# Patient Record
Sex: Female | Born: 1970 | Race: Black or African American | Hispanic: No | Marital: Married | Smoking: Former smoker
Health system: Southern US, Community
[De-identification: ages and names within clinical notes are randomized; demographics above are authoritative.]

## PROBLEM LIST (undated history)

## (undated) DIAGNOSIS — Z8042 Family history of malignant neoplasm of prostate: Secondary | ICD-10-CM

## (undated) DIAGNOSIS — T7840XA Allergy, unspecified, initial encounter: Secondary | ICD-10-CM

## (undated) DIAGNOSIS — M1990 Unspecified osteoarthritis, unspecified site: Secondary | ICD-10-CM

## (undated) DIAGNOSIS — Z801 Family history of malignant neoplasm of trachea, bronchus and lung: Secondary | ICD-10-CM

## (undated) DIAGNOSIS — D649 Anemia, unspecified: Secondary | ICD-10-CM

## (undated) DIAGNOSIS — Z8711 Personal history of peptic ulcer disease: Secondary | ICD-10-CM

## (undated) DIAGNOSIS — K219 Gastro-esophageal reflux disease without esophagitis: Secondary | ICD-10-CM

## (undated) DIAGNOSIS — Z800 Family history of malignant neoplasm of digestive organs: Secondary | ICD-10-CM

## (undated) DIAGNOSIS — G4700 Insomnia, unspecified: Secondary | ICD-10-CM

## (undated) DIAGNOSIS — I10 Essential (primary) hypertension: Secondary | ICD-10-CM

## (undated) DIAGNOSIS — Z803 Family history of malignant neoplasm of breast: Secondary | ICD-10-CM

## (undated) DIAGNOSIS — Z8049 Family history of malignant neoplasm of other genital organs: Secondary | ICD-10-CM

## (undated) HISTORY — PX: TUBAL LIGATION: SHX77

## (undated) HISTORY — DX: Personal history of peptic ulcer disease: Z87.11

## (undated) HISTORY — DX: Family history of malignant neoplasm of other genital organs: Z80.49

## (undated) HISTORY — DX: Unspecified osteoarthritis, unspecified site: M19.90

## (undated) HISTORY — DX: Family history of malignant neoplasm of breast: Z80.3

## (undated) HISTORY — DX: Family history of malignant neoplasm of trachea, bronchus and lung: Z80.1

## (undated) HISTORY — PX: CHOLECYSTECTOMY: SHX55

## (undated) HISTORY — DX: Anemia, unspecified: D64.9

## (undated) HISTORY — PX: APPENDECTOMY: SHX54

## (undated) HISTORY — PX: ABLATION: SHX5711

## (undated) HISTORY — DX: Family history of malignant neoplasm of prostate: Z80.42

## (undated) HISTORY — DX: Allergy, unspecified, initial encounter: T78.40XA

## (undated) HISTORY — DX: Family history of malignant neoplasm of digestive organs: Z80.0

## (undated) HISTORY — DX: Gastro-esophageal reflux disease without esophagitis: K21.9

## (undated) HISTORY — DX: Essential (primary) hypertension: I10

## (undated) HISTORY — PX: HERNIA REPAIR: SHX51

---

## 2000-12-21 ENCOUNTER — Encounter (HOSPITAL_COMMUNITY): Admission: RE | Admit: 2000-12-21 | Discharge: 2001-01-20 | Payer: Self-pay | Admitting: Rheumatology

## 2001-02-08 ENCOUNTER — Encounter (HOSPITAL_COMMUNITY): Admission: RE | Admit: 2001-02-08 | Discharge: 2001-03-10 | Payer: Self-pay | Admitting: Rheumatology

## 2001-03-28 ENCOUNTER — Ambulatory Visit (HOSPITAL_COMMUNITY): Admission: RE | Admit: 2001-03-28 | Discharge: 2001-03-28 | Payer: Self-pay | Admitting: Family Medicine

## 2001-03-28 ENCOUNTER — Encounter: Payer: Self-pay | Admitting: Family Medicine

## 2003-02-25 ENCOUNTER — Encounter: Payer: Self-pay | Admitting: Family Medicine

## 2003-02-25 ENCOUNTER — Ambulatory Visit (HOSPITAL_COMMUNITY): Admission: RE | Admit: 2003-02-25 | Discharge: 2003-02-25 | Payer: Self-pay | Admitting: Family Medicine

## 2003-08-19 ENCOUNTER — Ambulatory Visit (HOSPITAL_COMMUNITY): Admission: RE | Admit: 2003-08-19 | Discharge: 2003-08-19 | Payer: Self-pay | Admitting: Family Medicine

## 2004-01-01 ENCOUNTER — Encounter (HOSPITAL_COMMUNITY): Admission: RE | Admit: 2004-01-01 | Discharge: 2004-01-31 | Payer: Self-pay | Admitting: Family Medicine

## 2004-01-06 ENCOUNTER — Ambulatory Visit (HOSPITAL_COMMUNITY): Admission: RE | Admit: 2004-01-06 | Discharge: 2004-01-06 | Payer: Self-pay | Admitting: Family Medicine

## 2004-01-22 ENCOUNTER — Ambulatory Visit (HOSPITAL_COMMUNITY): Admission: RE | Admit: 2004-01-22 | Discharge: 2004-01-22 | Payer: Self-pay | Admitting: General Surgery

## 2004-08-31 ENCOUNTER — Ambulatory Visit (HOSPITAL_COMMUNITY): Admission: RE | Admit: 2004-08-31 | Discharge: 2004-08-31 | Payer: Self-pay | Admitting: Family Medicine

## 2004-09-23 ENCOUNTER — Ambulatory Visit (HOSPITAL_COMMUNITY): Admission: RE | Admit: 2004-09-23 | Discharge: 2004-09-23 | Payer: Self-pay | Admitting: *Deleted

## 2004-10-26 ENCOUNTER — Encounter (HOSPITAL_COMMUNITY): Admission: RE | Admit: 2004-10-26 | Discharge: 2004-10-26 | Payer: Self-pay | Admitting: *Deleted

## 2004-11-08 ENCOUNTER — Emergency Department (HOSPITAL_COMMUNITY): Admission: EM | Admit: 2004-11-08 | Discharge: 2004-11-08 | Payer: Self-pay | Admitting: *Deleted

## 2004-11-23 ENCOUNTER — Ambulatory Visit (HOSPITAL_COMMUNITY): Admission: RE | Admit: 2004-11-23 | Discharge: 2004-11-23 | Payer: Self-pay | Admitting: *Deleted

## 2005-01-13 ENCOUNTER — Ambulatory Visit (HOSPITAL_COMMUNITY): Admission: RE | Admit: 2005-01-13 | Discharge: 2005-01-13 | Payer: Self-pay | Admitting: Family Medicine

## 2005-04-08 ENCOUNTER — Ambulatory Visit: Payer: Self-pay | Admitting: Family Medicine

## 2006-10-24 ENCOUNTER — Ambulatory Visit: Payer: Self-pay | Admitting: Family Medicine

## 2006-10-24 DIAGNOSIS — N920 Excessive and frequent menstruation with regular cycle: Secondary | ICD-10-CM

## 2006-10-24 DIAGNOSIS — N951 Menopausal and female climacteric states: Secondary | ICD-10-CM

## 2006-10-24 DIAGNOSIS — I10 Essential (primary) hypertension: Secondary | ICD-10-CM | POA: Insufficient documentation

## 2006-11-23 ENCOUNTER — Encounter: Payer: Self-pay | Admitting: Family Medicine

## 2006-11-28 ENCOUNTER — Encounter: Payer: Self-pay | Admitting: Family Medicine

## 2006-12-04 ENCOUNTER — Encounter: Payer: Self-pay | Admitting: Family Medicine

## 2006-12-11 ENCOUNTER — Encounter: Payer: Self-pay | Admitting: Family Medicine

## 2006-12-11 ENCOUNTER — Ambulatory Visit (HOSPITAL_COMMUNITY): Admission: RE | Admit: 2006-12-11 | Discharge: 2006-12-11 | Payer: Self-pay | Admitting: Family Medicine

## 2006-12-11 LAB — CONVERTED CEMR LAB
ALT: 40 units/L — ABNORMAL HIGH (ref 0–35)
AST: 21 units/L (ref 0–37)
Albumin: 4 g/dL (ref 3.5–5.2)
Alkaline Phosphatase: 65 units/L (ref 39–117)
Calcium: 8.9 mg/dL (ref 8.4–10.5)
Cholesterol: 163 mg/dL (ref 0–200)
HCT: 39 % (ref 36.0–46.0)
Hemoglobin: 11.7 g/dL — ABNORMAL LOW (ref 12.0–15.0)
LDL Cholesterol: 112 mg/dL — ABNORMAL HIGH (ref 0–99)
MCV: 80.2 fL (ref 78.0–100.0)
Platelets: 459 10*3/uL — ABNORMAL HIGH (ref 150–400)
RDW: 17.2 % — ABNORMAL HIGH (ref 11.5–14.0)
Sodium: 139 meq/L (ref 135–145)
TSH: 0.607 microintl units/mL (ref 0.350–5.50)
Total Bilirubin: 0.5 mg/dL (ref 0.3–1.2)

## 2007-06-27 ENCOUNTER — Ambulatory Visit: Payer: Self-pay | Admitting: Obstetrics & Gynecology

## 2007-06-27 ENCOUNTER — Encounter: Payer: Self-pay | Admitting: Obstetrics & Gynecology

## 2007-07-11 ENCOUNTER — Ambulatory Visit: Payer: Self-pay | Admitting: Obstetrics & Gynecology

## 2007-07-17 ENCOUNTER — Encounter: Admission: RE | Admit: 2007-07-17 | Discharge: 2007-07-17 | Payer: Self-pay | Admitting: Obstetrics & Gynecology

## 2007-07-24 ENCOUNTER — Encounter: Payer: Self-pay | Admitting: Family Medicine

## 2007-07-25 ENCOUNTER — Ambulatory Visit: Payer: Self-pay | Admitting: Obstetrics & Gynecology

## 2007-07-26 ENCOUNTER — Encounter: Payer: Self-pay | Admitting: Family Medicine

## 2007-08-22 ENCOUNTER — Ambulatory Visit (HOSPITAL_COMMUNITY): Admission: RE | Admit: 2007-08-22 | Discharge: 2007-08-28 | Payer: Self-pay | Admitting: Obstetrics & Gynecology

## 2007-09-17 ENCOUNTER — Encounter: Payer: Self-pay | Admitting: Obstetrics & Gynecology

## 2007-09-17 ENCOUNTER — Ambulatory Visit (HOSPITAL_COMMUNITY): Admission: RE | Admit: 2007-09-17 | Discharge: 2007-09-17 | Payer: Self-pay | Admitting: Obstetrics & Gynecology

## 2007-09-17 ENCOUNTER — Ambulatory Visit: Payer: Self-pay | Admitting: Obstetrics & Gynecology

## 2007-09-25 ENCOUNTER — Telehealth: Payer: Self-pay | Admitting: Family Medicine

## 2007-09-28 ENCOUNTER — Ambulatory Visit: Payer: Self-pay | Admitting: Family Medicine

## 2007-09-28 DIAGNOSIS — L700 Acne vulgaris: Secondary | ICD-10-CM | POA: Insufficient documentation

## 2007-10-09 ENCOUNTER — Ambulatory Visit: Payer: Self-pay | Admitting: Family Medicine

## 2007-10-11 ENCOUNTER — Encounter: Payer: Self-pay | Admitting: Family Medicine

## 2007-12-25 ENCOUNTER — Ambulatory Visit: Payer: Self-pay | Admitting: Family Medicine

## 2007-12-25 DIAGNOSIS — N644 Mastodynia: Secondary | ICD-10-CM

## 2007-12-27 ENCOUNTER — Encounter: Payer: Self-pay | Admitting: Family Medicine

## 2007-12-27 LAB — CONVERTED CEMR LAB
BUN: 16 mg/dL (ref 6–23)
CO2: 25 meq/L (ref 19–32)
Calcium: 9.6 mg/dL (ref 8.4–10.5)
Chloride: 103 meq/L (ref 96–112)
Creatinine, Ser: 0.74 mg/dL (ref 0.40–1.20)
Glucose, Bld: 86 mg/dL (ref 70–99)
HDL: 39 mg/dL — ABNORMAL LOW (ref >39)
LDL Cholesterol: 97 mg/dL (ref 0–99)
Total Bilirubin: 0.5 mg/dL (ref 0.3–1.2)
Total CHOL/HDL Ratio: 3.7
VLDL: 10 mg/dL (ref 0–40)

## 2007-12-28 ENCOUNTER — Encounter: Payer: Self-pay | Admitting: Family Medicine

## 2008-01-02 ENCOUNTER — Encounter: Admission: RE | Admit: 2008-01-02 | Discharge: 2008-01-02 | Payer: Self-pay | Admitting: Family Medicine

## 2008-02-20 ENCOUNTER — Telehealth: Payer: Self-pay | Admitting: Family Medicine

## 2008-04-01 ENCOUNTER — Ambulatory Visit: Payer: Self-pay | Admitting: Family Medicine

## 2008-05-01 ENCOUNTER — Ambulatory Visit: Payer: Self-pay | Admitting: Family Medicine

## 2008-05-01 DIAGNOSIS — M25562 Pain in left knee: Secondary | ICD-10-CM | POA: Insufficient documentation

## 2008-05-01 DIAGNOSIS — M2140 Flat foot [pes planus] (acquired), unspecified foot: Secondary | ICD-10-CM | POA: Insufficient documentation

## 2008-05-01 DIAGNOSIS — M25569 Pain in unspecified knee: Secondary | ICD-10-CM

## 2008-05-15 ENCOUNTER — Encounter: Admission: RE | Admit: 2008-05-15 | Discharge: 2008-05-15 | Payer: Self-pay | Admitting: Sports Medicine

## 2009-06-12 ENCOUNTER — Ambulatory Visit (HOSPITAL_COMMUNITY): Admission: RE | Admit: 2009-06-12 | Discharge: 2009-06-12 | Payer: Self-pay | Admitting: Family Medicine

## 2010-08-09 ENCOUNTER — Ambulatory Visit (HOSPITAL_COMMUNITY)
Admission: RE | Admit: 2010-08-09 | Discharge: 2010-08-09 | Payer: Self-pay | Source: Home / Self Care | Attending: Internal Medicine | Admitting: Internal Medicine

## 2010-08-14 ENCOUNTER — Encounter: Payer: Self-pay | Admitting: Family Medicine

## 2010-08-15 ENCOUNTER — Encounter: Payer: Self-pay | Admitting: Family Medicine

## 2010-08-15 ENCOUNTER — Encounter: Payer: Self-pay | Admitting: *Deleted

## 2010-08-16 ENCOUNTER — Encounter
Admission: RE | Admit: 2010-08-16 | Discharge: 2010-08-16 | Payer: Self-pay | Source: Home / Self Care | Attending: Internal Medicine | Admitting: Internal Medicine

## 2010-08-18 ENCOUNTER — Ambulatory Visit (HOSPITAL_COMMUNITY): Admission: RE | Admit: 2010-08-18 | Payer: Self-pay | Source: Home / Self Care | Admitting: Internal Medicine

## 2010-08-24 NOTE — Letter (Signed)
Summary: rpc chart  rpc chart   Imported By: Curtis Sites 02/22/2010 14:42:22  _____________________________________________________________________  External Attachment:    Type:   Image     Comment:   External Document

## 2010-08-25 ENCOUNTER — Encounter: Payer: Self-pay | Admitting: Internal Medicine

## 2010-12-07 NOTE — Assessment & Plan Note (Signed)
NAME:  Kristine Lawson, Kristine Lawson              ACCOUNT NO.:  [id number]   MEDICAL RECORD NO.:  [id number]          PATIENT TYPE:  POB   LOCATION:  [facility] [place]         FACILITY:  [facility]   PHYSICIAN:  Elsie Lincoln, MD      DATE OF BIRTH:  04-Aug-1970   DATE OF SERVICE:                                  CLINIC NOTE   The patient presents for followup results of her transvaginal ultrasound  and endometrial biopsy.  The endometrial biopsy shows portions of a  benign endometrial polyp with no hyperplasia or malignancy identified.  The uterus is grossly normal with a 12-mm endometrium.  There is a  dominant follicle in one of her ovaries.  Otherwise normal.   I talked to the patient about getting the polyp out.  We decided for  D&C, hysteroscopy, polypectomy, and I offered to go ahead and do hydro-  thermo-ablation to help her generally heavy periods.  The patient was  consented for the procedure.  The risks include but are not limited to  bleeding, infection, damage to the back of the uterus and burning of the  vagina.  The patient will hopefully be scheduled some time in January.           ______________________________  Elsie Lincoln, MD     KL/MEDQ  D:  07/25/2007  T:  07/25/2007  Job:  [id number]
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
## 2010-12-07 NOTE — Op Note (Signed)
[name], Kristine Lawson              ACCOUNT NO.:  [id number]   MEDICAL RECORD NO.:  [id number]          PATIENT TYPE:  AMB   LOCATION:  [facility]                           FACILITY:  [facility]   PHYSICIAN:  Lesly Dukes, M.D. DATE OF BIRTH:  Nov 29, 1970   DATE OF PROCEDURE:  09/17/2007  DATE OF DISCHARGE:                               OPERATIVE REPORT   PREOPERATIVE DIAGNOSIS:  A 40 year old female with menometrorrhagia and  endometrial polyp.   POSTOPERATIVE DIAGNOSIS:  A 40 year old female with menometrorrhagia and  endometrial polyp.   PROCEDURES:  1. Dilation and curettage.  2. Hysteroscopy.  3. Polypectomy.  4. Endometrial ablation.   SURGEON:  Lesly Dukes, M.D.   ANESTHESIA:  General.   FINDINGS:  Small polyp at the anterior endocervical uterine junction.   SPECIMEN:  Endometrial curettings to pathology.   ESTIMATED BLOOD LOSS:  Minimal.   COMPLICATIONS:  None.   PROCEDURE:  After informed consent was obtained, the patient was taken  to the operating room.  General anesthesia was found to be adequate.  The patient was placed in the dorsal lithotomy position and prepared and  draped in the normal sterile fashion.  The bladder was emptied with the  catheter.  A bivalved speculum was placed into the patient's vagina and  the anterior lip of the cervix was grasped with the single-toothed  tenaculum.  The cervical os was gently dilated to 7 Hegar dilator.  We  used the hydrothermal ablation by [employer] for this procedure.  The hysteroscope was introduced into the uterus and a polyp was seen.  A  gentle D& C was done and specimen was sent to pathology.  The  hysteroscope was then reintroduced, and there was a good seal of the  cervix.  The ablation was performed to accompany guidelines lasting 10  minutes with an appropriate cool down.  All  instruments were removed from the patient's vagina, and there was good  hemostasis from the cervix.  The patient tolerated  the procedure well.  Sponge, lap, instrument and needle counts were correct x2.  The patient  went to the recovery room in stable condition.      Lesly Dukes, M.D.  Electronically Signed     KHL/MEDQ  D:  09/17/2007  T:  09/17/2007  Job:  [id number]

## 2010-12-07 NOTE — Assessment & Plan Note (Signed)
[name], [name]              ACCOUNT NO.:  [id number]   MEDICAL RECORD NO.:  [id number]          PATIENT TYPE:  POB   LOCATION:  [facility] [place]         FACILITY:  [facility]   PHYSICIAN:  Elsie Lincoln, MD      DATE OF BIRTH:  [birth year]   DATE OF SERVICE:  07/11/2007                                  CLINIC NOTE   The patient is a 40 year old female with abnormal uterine bleeding who  was seen here first on June 27, 2007.  She presents back for  endometrial biopsy.  After informed consent was obtained, the patient  was placed in dorsal lithotomy position.  Bivalve speculum was placed  into the patient's vagina, and the cervix was prepped with Betadine.  The uterus sounded to 9 cm. Two passes with the endometrial biopsy  curette was done.  The patient is scheduled for a transvaginal  ultrasound to evaluate for fibroids or polyp.  Also of note her mother  has had premenopausal breast cancer, and she is being tested for the  BRCA 1and 2 genes.  We will follow up on that when it is done.  The  patient to come back in 2 weeks for results of endometrial biopsy and  transvaginal ultrasound, and we will come up with a plan.           ______________________________  Elsie Lincoln, MD     KL/MEDQ  D:  07/11/2007  T:  07/11/2007  Job:  [id number]

## 2010-12-10 NOTE — H&P (Signed)
NAME:  Kristine Lawson, Kristine Lawson                            ACCOUNT NO.:  [id number]   MEDICAL RECORD NO.:  [id number]                   PATIENT TYPE:  AMB   LOCATION:  DAY                                  FACILITY:  [facility]   PHYSICIAN:  Jerolyn Shin C. Katrinka Blazing, M.D.                DATE OF BIRTH:  April 15, 1971   DATE OF ADMISSION:  DATE OF DISCHARGE:                                HISTORY & PHYSICAL   A 40 year old female with greater than a 15 year history of gastroesophageal  reflux disease.  She has been treated with Nexium with minimal improvement.  She has nausea on a daily basis.  She has difficulty swallowing solids and  liquids.  She is referred for EGD.   PAST HISTORY:  1. Osteoarthritis.  2. Hypertension.  3. Seasonal allergies.   SURGERY:  1. Cholecystectomy.  2. Tubal ligation.  3. Appendectomy.   MEDICATIONS:  1. Celebrex 200 mg every day.  2. Atacand HCT 16/12.5 every day.  3. Allegra D b.i.d.  4. Nexium 40 mg every day.   FAMILY HISTORY:  Diabetes mellitus, hypertension.   PHYSICAL EXAMINATION:  VITAL SIGNS:  Blood pressure 124/84, pulse of 80,  respirations 20, weight 256 pounds.  HEENT:  Unremarkable.  NECK:  Supple.  No JVD or bruit.  CHEST:  Clear to auscultation.  HEART:  Regular rate and rhythm without murmur, gallop, or rub.  ABDOMEN:  Mild epigastric tenderness.  No masses.  EXTREMITIES:  No cyanosis, clubbing, or edema.  NEUROLOGIC:  No focal motor, sensory, or cerebellar deficits.   IMPRESSION:  1. Gastroesophageal reflux disease with dysphagia.  2. Hypertension.  3. Osteoarthritis.  4. Costochondritis.   PLAN:  Upper endoscopy.     ___________________________________________                                         Dirk Dress. Katrinka Blazing, M.D.   LCS/MEDQ  D:  01/21/2004  T:  01/22/2004  Job:  [id number]

## 2010-12-10 NOTE — Procedures (Signed)
NAMEKELSE, PLOCH                  ACCOUNT NO.:  [id number]   MEDICAL RECORD NO.:  [id number]          PATIENT TYPE:  OUT   LOCATION:  [facility]                           FACILITY:  [facility]   PHYSICIAN:  Dani Gobble, MD       DATE OF BIRTH:  10/16/1970   DATE OF PROCEDURE:  10/26/2004  DATE OF DISCHARGE:                                  ECHOCARDIOGRAM   REFERRING:  Dr. Lodema Hong and Dr. Domingo Sep.   INDICATIONS:  Chest pain and syncope.   Technical quality of the study is adequate.   The aorta is within normal limits at 2.8 cm.   The left atrium subjectively appears to be normal in size. No clots or  masses were appreciated. The patient appeared to be in sinus rhythm during  this procedure.   The interventricular septum and posterior wall were mildly thickened.   The aortic valve appeared to be thin, trileaflet, and pliable with normal  leaflet excursion. No significant aortic insufficiency is noted. Doppler  interrogation of the aortic valve is within normal limits. The mitral valve  appeared structurally normal. No mitral valve prolapse is noted. Trace to  mild mitral regurgitation was noted. Doppler interrogation of the mitral  valve was within normal limits.   Pulmonic valve was incompletely visualized but appeared to be grossly  structurally normal.   Tricuspid valve also appeared grossly structurally normal with trivial  tricuspid regurgitation noted.   Left ventricle is normal in size with the LVIDD measured at 4.7 cm and the  LVISD measured at 2.9 cm. Overall left systolic function was normal, and no  regional wall motion abnormalities were appreciated. There was no evidence  for diastolic dysfunction on the inflow signal.   The right ventricle subjectively appeared to be borderline to mildly dilated  but with normal right systolic function. The right atrium appeared normal in  size.   IMPRESSION:  1.  Mild concentric LVH.  2.  Trace to mild mitral regurgitation.  3.   Trivial tricuspid regurgitation.  4.  Normal left size and systolic function without regional wall motion      abnormalities noted.  5.  Borderline to mild right ventricle enlargement with normal right      ventricular systolic function.      AB/MEDQ  D:  10/26/2004  T:  10/26/2004  Job:  [id number]

## 2010-12-10 NOTE — Procedures (Signed)
Kristine Lawson, Kristine Lawson                  ACCOUNT NO.:  [id number]   MEDICAL RECORD NO.:  [id number]          PATIENT TYPE:  OUT   LOCATION:  [facility]                           FACILITY:  [facility]   PHYSICIAN:  Dani Gobble, MD       DATE OF BIRTH:  1971-05-23   DATE OF PROCEDURE:  10/26/2004  DATE OF DISCHARGE:                                    STRESS TEST   REFERRING PHYSICIAN:  Milus Mallick. Lodema Hong, M.D., Dani Gobble, MD   INDICATIONS FOR PROCEDURE:  Chest pain and syncope.   ELECTROCARDIOGRAM/ HEMODYNAMIC DATA:  Baseline blood pressure 122/80 mmHg  with pulse of 66 beats per minute.  Baseline 12 lead electrocardiogram  revealed normal sinus rhythm without ischemic changes noted.  The patient  walked for five minutes, 40 seconds on a full Bruce protocol surpassing her  target heart rate of 159 beats per minute, proceeding to a peak heart rate  of 160 beats per minute.  She experienced no chest pain during the  procedure.  She was mildly short of breath and fatigued at peak exercise.  She stopped secondary to achieving target heart rate.  Electrocardiogram at  peak heart rate was notable for sinus tachycardia without ischemic changes  noted.  She had rare PVC's.  Recovery revealed no further electrocardiogram  changes.  She recovered nicely within 5 minutes.  She had no further  symptoms.   IMPRESSION:  1.  Clinically negative for angina.  2.  Electrocardiogram negative for ischemia.  3.  Moderate deconditioning.  4.  Scintigraphic images are pending.      AB/MEDQ  D:  10/26/2004  T:  10/26/2004  Job:  [id number]   cc:   Milus Mallick. Lodema Hong, M.D.  [address]  [place], [place] [address]  Fax: [phone]

## 2010-12-10 NOTE — Cardiovascular Report (Signed)
NAMEFRANCA, STAKES                  ACCOUNT NO.:  [id number]   MEDICAL RECORD NO.:  [id number]          PATIENT TYPE:  OIB   LOCATION:  NA                           FACILITY:  [facility]   PHYSICIAN:  Darlin Priestly, MD  DATE OF BIRTH:  1970-09-05   DATE OF PROCEDURE:  11/23/2004  DATE OF DISCHARGE:                              CARDIAC CATHETERIZATION   PROCEDURE:  Heads-up tilt table test with Isuprel infusion.   ATTENDING:  Darlin Priestly, MD   COMPLICATIONS:  None.   INDICATIONS:  Kristine Lawson is a 40 year old female, a patient of Dr. Syliva Overman, with a history of hypertension, degenerative joint disease, reflux,  who has had 4 episodes of syncope since June of '04.  She has undergone a  stress test and imaging revealing no significant ischemic and normal EF.  She has also had a 2-D echocardiogram revealing normal LV function with no  significant valvular disease.  She is now referred for a heads-up tilt table  testing to rule out neurocardiogenic syncope as possible etiology.   DESCRIPTION OF OPERATION:  After giving written informed consent, the  patient was brought to the cath lab in a fasting state.  The patient was  then placed in a supine position and hemodynamic measurements were obtained.  Beginning blood pressure was 102/60, resting heart rate of 55.  The patient  was then monitored for 5 minutes.  She was then tilted to a 70-degree heads-  up position, which she maintained for 30 minutes.  She remained  hemodynamically stable with no significant symptoms.  She was then returned  to a supine position and Isuprel infusion was begun at 1 mcg.  This was  ultimately up-titrated to 2 mcg.  The patient was then tilted again to 70-  degree heads-up position for 10 minutes with Isuprel infusion continuous.  She continued to remain hemodynamically stable with no symptoms.  After 10  minutes, she was again returned to a supine position.  Hemodynamic measurements remained  stable.  She was then disconnected from  the monitor and transferred to recovery room in stable condition.   CONCLUSIONS:  Negative heads-up tilt table testing with Isuprel infusion.      RHM/MEDQ  D:  11/23/2004  T:  11/23/2004  Job:  [id number]   cc:   Dani Gobble, MD  Fax: [phone]   Milus Mallick. Lodema Hong, M.D.  [address]  [place], [place] [address]  Fax: [phone]

## 2011-03-03 ENCOUNTER — Encounter: Payer: Self-pay | Admitting: Family Medicine

## 2011-03-04 ENCOUNTER — Encounter: Payer: Self-pay | Admitting: Family Medicine

## 2011-03-04 ENCOUNTER — Other Ambulatory Visit: Payer: Self-pay | Admitting: Family Medicine

## 2011-03-04 ENCOUNTER — Ambulatory Visit (INDEPENDENT_AMBULATORY_CARE_PROVIDER_SITE_OTHER): Payer: Commercial Managed Care - PPO | Admitting: Family Medicine

## 2011-03-04 VITALS — BP 120/82 | HR 67 | Resp 16 | Ht 69.0 in | Wt 210.4 lb

## 2011-03-04 DIAGNOSIS — N920 Excessive and frequent menstruation with regular cycle: Secondary | ICD-10-CM

## 2011-03-04 DIAGNOSIS — N951 Menopausal and female climacteric states: Secondary | ICD-10-CM

## 2011-03-04 DIAGNOSIS — E669 Obesity, unspecified: Secondary | ICD-10-CM

## 2011-03-04 DIAGNOSIS — I10 Essential (primary) hypertension: Secondary | ICD-10-CM

## 2011-03-04 NOTE — Progress Notes (Signed)
  Subjective:    Patient ID: Kristine Lawson, female    DOB: [birth year], 40 y.o.   MRN: [id number]  HPI The PT is here to establish care and evaluate  chronic medical conditions. She reports having labs earlier this year which were normal, they will be obtained for review.  She reports significant debility from hot flashes, mood instability,crying spells, memory loss and poor concentration over the past several months, which appears to be worsening. Her spouse who is present is also concerned. States that even on the job she experiences embarrassing hot flashes, which cause her to  Have problems with focus and make her very uncomfortable.Reports low sex drive, while her spouse is just the opposite. This is a new problem for her also She had endometrial ablation for heavy cycles several years ago.States that for the first year she bled every month, though less. Following this her cycles have become more infrequent, and at this time she bleeds on average every 4 to 6 months. Reports that her symptoms  Occur and are worse  when she has no menstrual cycle. She also has concerns about weight gain. She has done exceptionally well with weight loss through lifestyle changee and remains committed to regular exercise, unfortunately , she states she has developed the bad habit of eating the "wrong food" in large quantities late at night once more and her weight has started increasing again. [place] she and her spouse share this concern    Review of Systems Denies recent fever or chills. Denies sinus pressure, nasal congestion, ear pain or sore throat. Denies chest congestion, productive cough or wheezing. Denies chest pains, palpitations and leg swelling Denies abdominal pain, nausea, vomiting,diarrhea or constipation.   Denies dysuria, frequency, hesitancy or incontinence. Denies joint pain, swelling and limitation in mobility. Denies headaches, seizures, numbness, or tingling. Denies skin break down or  rash.        Objective:   Physical Exam Patient alert and oriented and in no cardiopulmonary distress.  HEENT: No facial asymmetry, EOMI,   oropharynx pink and moist.  Neck supple no adenopathy.  Chest: Clear to auscultation bilaterally.  CVS: S1, S2 no murmurs, no S3.  ABD: Soft non tender. Bowel sounds normal.  Ext: No edema  MS: Adequate ROM spine, shoulders, hips and knees.  Skin: Intact, no ulcerations or rash noted.  Psych: Good eye contact, normal affect. Memory intact not anxious or depressed appearing.  CNS: CN 2-12 intact, power, normal throughout.        Assessment & Plan:

## 2011-03-04 NOTE — Patient Instructions (Addendum)
F/u in 3 months  It is important that you exercise regularly at least 30 minutes 6 times a week. If you develop chest pain, have severe difficulty breathing, or feel very tired, stop exercising immediately and seek medical attention   A healthy diet is rich in fruit, vegetables and whole grains. Poultry fish, nuts and beans are a healthy choice for protein rather then red meat. A low sodium diet and drinking 64 ounces of water daily is generally recommended. Oils and sweet should be limited. Carbohydrates especially for those who are diabetic or overweight, should be limited to 34-45 gram per meal. It is important to eat on a regular schedule, at least 3 times daily. Snacks should be primarily fruits, vegetables or nuts.  FSH and LH today

## 2011-03-05 DIAGNOSIS — E663 Overweight: Secondary | ICD-10-CM | POA: Insufficient documentation

## 2011-03-05 NOTE — Assessment & Plan Note (Signed)
Controlled, no change in medication  

## 2011-03-05 NOTE — Assessment & Plan Note (Signed)
After extremely sucesful  Weight loss, pt reports gradual weight gain, will work at this

## 2011-03-05 NOTE — Assessment & Plan Note (Signed)
Resolved, now experiencing perimenopausal symptoms with very irregular cycles. Will check hormone levels

## 2011-03-05 NOTE — Assessment & Plan Note (Signed)
Worsened, will review earlier labs , hopefully rsh has been checked. fsh and lh to be checked, use of venlafexine discussed, pt not keen at this time. Due to fam h/o breast cancer hormone therapy not preferred , I also dicussed this . She will think about her options. May need to see gynae

## 2011-03-08 LAB — VITAMIN D 25 HYDROXY (VIT D DEFICIENCY, FRACTURES): Vit D, 25-Hydroxy: 21 ng/mL — ABNORMAL LOW (ref 30–89)

## 2011-03-09 MED ORDER — VITAMIN D3 1.25 MG (50000 UT) PO CAPS: 1.0000 | ORAL_CAPSULE | ORAL | Status: DC

## 2011-03-09 NOTE — Progress Notes (Signed)
Addended by: Adella Hare B on: 03/09/2011 01:29 PM   Modules accepted: Orders

## 2011-04-14 LAB — PREGNANCY, URINE: Preg Test, Ur: NEGATIVE

## 2011-04-14 LAB — CBC
HCT: 36.5
Platelets: 418 — ABNORMAL HIGH

## 2011-04-15 LAB — PREGNANCY, URINE: Preg Test, Ur: NEGATIVE

## 2011-05-18 ENCOUNTER — Other Ambulatory Visit: Payer: Self-pay

## 2011-05-18 MED ORDER — OLMESARTAN MEDOXOMIL-HCTZ 20-12.5 MG PO TABS: 1.0000 | ORAL_TABLET | Freq: Every day | ORAL | Status: DC

## 2011-08-15 ENCOUNTER — Ambulatory Visit (INDEPENDENT_AMBULATORY_CARE_PROVIDER_SITE_OTHER): Payer: Commercial Managed Care - PPO | Admitting: Family Medicine

## 2011-08-15 ENCOUNTER — Encounter: Payer: Self-pay | Admitting: Family Medicine

## 2011-08-15 VITALS — BP 100/72 | HR 55 | Resp 16 | Ht 69.0 in | Wt 211.1 lb

## 2011-08-15 DIAGNOSIS — N951 Menopausal and female climacteric states: Secondary | ICD-10-CM

## 2011-08-15 DIAGNOSIS — I10 Essential (primary) hypertension: Secondary | ICD-10-CM

## 2011-08-15 DIAGNOSIS — E669 Obesity, unspecified: Secondary | ICD-10-CM

## 2011-08-15 MED ORDER — VENLAFAXINE HCL 37.5 MG PO TABS: 37.5000 mg | ORAL_TABLET | Freq: Two times a day (BID) | ORAL | Status: DC

## 2011-08-15 NOTE — Patient Instructions (Signed)
For your hot flashes, start the Venlafaxine, take 1 tablet a day Please get your labs done within the next 2 months- do not eat after midnight Continue your blood pressure medication F/U in 6 weeks for recheck on medication

## 2011-08-17 ENCOUNTER — Encounter: Payer: Self-pay | Admitting: Family Medicine

## 2011-08-17 NOTE — Assessment & Plan Note (Signed)
Will start low dose Effexor and titrate as needed. Patient has family history of breast cancer therefore hormone therapy. She does not want to see GYN at this time.

## 2011-08-17 NOTE — Assessment & Plan Note (Addendum)
At goal no change the medication. Labs to be done

## 2011-08-17 NOTE — Assessment & Plan Note (Signed)
The patient currently very motivated for weight loss. She's to continue her low-carb low-fat diet. We'll also obtain fasting lipid panel and she is at high risk for hyperlipidemia

## 2011-08-17 NOTE — Progress Notes (Signed)
  Subjective:    Patient ID: Kristine Lawson, female    DOB: May 01, 1971, 41 y.o.   MRN: [id number]  HPI Patient here to followup hypertension and hot flashes  Hypertension- tolerating Benicar without any concern. Denies any chest pain, leg edema. Patient due for labs  Hot flashes- patient continues to have very severe hot flashes. At her previous visit with her PCP did discuss starting Effexor at that time she wants to hold off she would like to start a low dose at this time. She was concerned about been labile with depression because of the type of medication. Reviewed her previous hormone labs  Obesity- patient is currently on a low carb low fat diet with her husband. They're trying to increase her exercise as well. She was on Phentermine in the past and had good success. She wants to continue with her current diet and will start cutting back on the overall calories.  Review of Systems  GEN- denies fatigue, fever, weight loss,weakness, recent illness HEENT- denies eye drainage, change in vision, nasal discharge, CVS- denies chest pain, palpitations RESP- denies SOB, cough, wheeze ABD- denies N/V, change in stools, abd pain Neuro- denies headache, dizziness, syncope, seizure activity       Objective:   Physical Exam GEN- NAD, alert and oriented x3, obese HEENT- PERRL, EOMI, non injected sclera, pink conjunctiva, MMM, oropharynx clear Neck- Supple, no thryomegaly CVS- RRR, no murmur RESP-CTAB EXT- No edema Pulses- Radial, DP- 2+        Assessment & Plan:

## 2011-08-29 ENCOUNTER — Other Ambulatory Visit: Payer: Self-pay | Admitting: Family Medicine

## 2011-08-29 DIAGNOSIS — Z1231 Encounter for screening mammogram for malignant neoplasm of breast: Secondary | ICD-10-CM

## 2011-09-01 ENCOUNTER — Other Ambulatory Visit: Payer: Self-pay

## 2011-09-01 MED ORDER — VENLAFAXINE HCL 37.5 MG PO TABS: 37.5000 mg | ORAL_TABLET | Freq: Two times a day (BID) | ORAL | Status: DC

## 2011-09-12 ENCOUNTER — Ambulatory Visit
Admission: RE | Admit: 2011-09-12 | Discharge: 2011-09-12 | Disposition: A | Discharge status: Home / Self Care | Payer: 59 | Source: Ambulatory Visit | Attending: Family Medicine | Admitting: Family Medicine

## 2011-09-12 DIAGNOSIS — Z1231 Encounter for screening mammogram for malignant neoplasm of breast: Secondary | ICD-10-CM

## 2011-09-14 ENCOUNTER — Telehealth: Payer: Self-pay | Admitting: Family Medicine

## 2011-09-14 NOTE — Telephone Encounter (Signed)
Pt aware. And will receive letter with results.

## 2011-11-01 LAB — CBC
HCT: 43.8 % (ref 36.0–46.0)
Hemoglobin: 13.8 g/dL (ref 12.0–15.0)
MCH: 29.2 pg (ref 26.0–34.0)
MCHC: 31.5 g/dL (ref 30.0–36.0)

## 2011-11-01 LAB — BASIC METABOLIC PANEL
BUN: 13 mg/dL (ref 6–23)
CO2: 29 mEq/L (ref 19–32)
Glucose, Bld: 66 mg/dL — ABNORMAL LOW (ref 70–99)
Potassium: 4.6 mEq/L (ref 3.5–5.3)

## 2011-11-01 LAB — LIPID PANEL
Cholesterol: 163 mg/dL (ref 0–200)
VLDL: 7 mg/dL (ref 0–40)

## 2012-02-28 ENCOUNTER — Ambulatory Visit (INDEPENDENT_AMBULATORY_CARE_PROVIDER_SITE_OTHER): Payer: 59 | Admitting: Family Medicine

## 2012-02-28 ENCOUNTER — Encounter: Payer: Self-pay | Admitting: Family Medicine

## 2012-02-28 VITALS — BP 128/74 | HR 87 | Resp 18 | Ht 69.0 in | Wt 209.0 lb

## 2012-02-28 DIAGNOSIS — N951 Menopausal and female climacteric states: Secondary | ICD-10-CM

## 2012-02-28 DIAGNOSIS — M25569 Pain in unspecified knee: Secondary | ICD-10-CM

## 2012-02-28 DIAGNOSIS — N920 Excessive and frequent menstruation with regular cycle: Secondary | ICD-10-CM

## 2012-02-28 DIAGNOSIS — I10 Essential (primary) hypertension: Secondary | ICD-10-CM

## 2012-02-28 DIAGNOSIS — E669 Obesity, unspecified: Secondary | ICD-10-CM

## 2012-02-28 DIAGNOSIS — L299 Pruritus, unspecified: Secondary | ICD-10-CM

## 2012-02-28 MED ORDER — TRIAMTERENE-HCTZ 37.5-25 MG PO TABS: 1.0000 | ORAL_TABLET | Freq: Every day | ORAL | Status: DC

## 2012-02-28 NOTE — Patient Instructions (Addendum)
F/u in January.Call if you need me  It is important that you exercise regularly at least 30 minutes 5 times a week. If you develop chest pain, have severe difficulty breathing, or feel very tired, stop exercising immediately and seek medical attention   A healthy diet is rich in fruit, vegetables and whole grains. Poultry fish, nuts and beans are a healthy choice for protein rather then red meat. A low sodium diet and drinking 64 ounces of water daily is generally recommended. Oils and sweet should be limited. Carbohydrates especially for those who are diabetic or overweight, should be limited to 34-45 gram per meal. It is important to eat on a regular schedule, at least 3 times daily. Snacks should be primarily fruits, vegetables or nuts. Change in blood pressure medication. Stop benicar/HCTZ, start maxzide one daily.  New medication for itchy scalp.  Refill on velafaxine.  Letter of necessity will be prepared  Keep on doing well    Your pap is overdue, schedule wherever you desire, and we are still here  It is important that you exercise regularly at least 30 minutes 5 times a week. If you develop chest pain, have severe difficulty breathing, or feel very tired, stop exercising immediately and seek medical attention   A healthy diet is rich in fruit, vegetables and whole grains. Poultry fish, nuts and beans are a healthy choice for protein rather then red meat. A low sodium diet and drinking 64 ounces of water daily is generally recommended. Oils and sweet should be limited. Carbohydrates especially for those who are diabetic or overweight, should be limited to 34-45 gram per meal. It is important to eat on a regular schedule, at least 3 times daily. Snacks should be primarily fruits, vegetables or nuts.

## 2012-02-28 NOTE — Progress Notes (Signed)
  Subjective:    Patient ID: Kristine Lawson, female    DOB: 25-Jan-1971, 41 y.o.   MRN: [id number]  HPI  Pt here for consultation to support lap band. Was as low as 183 pounds in May 2010, has regained 26 pounds and states she is mentally incapable of staying focused enough to maintain weight loss on her own.  Hypertension, failure to sustain weight loss, family h/o DM , and personal h/o arthritis  are reasons she desires the procedure. Has responded very well to effexor for hot flashes and mood control while working, tends notto use it on weekends. C/o severe itching of scalp mainly at night, has been using sleep aids and benadryl insuffucient relief, will try hydroxyzine  Review of Systems See HPI Denies recent fever or chills. Denies sinus pressure, nasal congestion, ear pain or sore throat. Denies chest congestion, productive cough or wheezing. Denies chest pains, palpitations and leg swelling Denies abdominal pain, nausea, vomiting,diarrhea or constipation.   Denies dysuria, frequency, hesitancy or incontinence. . Denies headaches, seizures, numbness, or tingling. Denies depression, anxiety or insomnia. Denies skin break down or rash.        Objective:   Physical Exam  Patient alert and oriented and in no cardiopulmonary distress.  HEENT: No facial asymmetry, EOMI, no sinus tenderness,  oropharynx pink and moist.  Neck supple no adenopathy.  Chest: Clear to auscultation bilaterally.  CVS: S1, S2 no murmurs, no S3.  ABD: Soft non tender. Bowel sounds normal.  Ext: No edema  MS: Adequate ROM spine, shoulders, hips and knees.  Skin: Intact, no ulcerations or rash noted.  Psych: Good eye contact, normal affect. Memory intact not anxious or depressed appearing.  CNS: CN 2-12 intact, power, tone and sensation normal throughout.       Assessment & Plan:

## 2012-03-01 ENCOUNTER — Other Ambulatory Visit: Payer: Self-pay

## 2012-03-01 DIAGNOSIS — L299 Pruritus, unspecified: Secondary | ICD-10-CM

## 2012-03-01 MED ORDER — HYDROXYZINE HCL 50 MG PO TABS: ORAL_TABLET | ORAL | Status: DC

## 2012-03-09 NOTE — Assessment & Plan Note (Signed)
Deteriorated. Patient re-educated about  the importance of commitment to a  minimum of 150 minutes of exercise per week. The importance of healthy food choices with portion control discussed. Encouraged to start a food diary, count calories and to consider  joining a support group. Sample diet sheets offered. Goals set by the patient for the next several months.  States she has decided on weight reduction surgery, reports 30 pound weigh loss in the past 3 years and inability to stay iocused on healthy lifestyle

## 2012-03-09 NOTE — Assessment & Plan Note (Signed)
Controlled with effexor which she uses mainly during the work week, not on weekends

## 2012-03-09 NOTE — Assessment & Plan Note (Signed)
Anti inflammatory , as neded, otc

## 2012-03-09 NOTE — Assessment & Plan Note (Addendum)
Controlled,  change in medication to maxzide when out of benicar/hctz she currently has, cost concern DASH diet and commitment to daily physical activity for a minimum of 30 minutes discussed and encouraged, as a part of hypertension management. The importance of attaining a healthy weight is also discussed.

## 2012-03-09 NOTE — Assessment & Plan Note (Signed)
No skin lesion but disturbing to sleep, hydroxyzine prescribed

## 2012-03-09 NOTE — Assessment & Plan Note (Signed)
Resolved with ablation °

## 2012-03-27 ENCOUNTER — Other Ambulatory Visit: Payer: Self-pay

## 2012-03-27 MED ORDER — NAPROXEN 500 MG PO TABS: 500.0000 mg | ORAL_TABLET | Freq: Two times a day (BID) | ORAL | Status: DC

## 2012-07-11 ENCOUNTER — Ambulatory Visit: Payer: 59 | Admitting: Family Medicine

## 2012-08-29 ENCOUNTER — Emergency Department (HOSPITAL_COMMUNITY): Payer: 59

## 2012-08-29 ENCOUNTER — Emergency Department (HOSPITAL_COMMUNITY)
Admission: EM | Admit: 2012-08-29 | Discharge: 2012-08-30 | Disposition: A | Discharge status: Home / Self Care | Payer: 59 | Attending: Emergency Medicine | Admitting: Emergency Medicine

## 2012-08-29 ENCOUNTER — Encounter (HOSPITAL_COMMUNITY): Payer: Self-pay | Admitting: Emergency Medicine

## 2012-08-29 DIAGNOSIS — Z79899 Other long term (current) drug therapy: Secondary | ICD-10-CM | POA: Insufficient documentation

## 2012-08-29 DIAGNOSIS — R110 Nausea: Secondary | ICD-10-CM | POA: Insufficient documentation

## 2012-08-29 DIAGNOSIS — Z8739 Personal history of other diseases of the musculoskeletal system and connective tissue: Secondary | ICD-10-CM | POA: Insufficient documentation

## 2012-08-29 DIAGNOSIS — R55 Syncope and collapse: Secondary | ICD-10-CM | POA: Insufficient documentation

## 2012-08-29 DIAGNOSIS — I10 Essential (primary) hypertension: Secondary | ICD-10-CM | POA: Insufficient documentation

## 2012-08-29 DIAGNOSIS — R1084 Generalized abdominal pain: Secondary | ICD-10-CM | POA: Insufficient documentation

## 2012-08-29 DIAGNOSIS — R51 Headache: Secondary | ICD-10-CM | POA: Insufficient documentation

## 2012-08-29 LAB — CBC WITH DIFFERENTIAL/PLATELET
Eosinophils Relative: 3 % (ref 0–5)
HCT: 40.1 % (ref 36.0–46.0)
Hemoglobin: 13.5 g/dL (ref 12.0–15.0)
Lymphocytes Relative: 41 % (ref 12–46)
Lymphs Abs: 2.2 10*3/uL (ref 0.7–4.0)
MCV: 87.9 fL (ref 78.0–100.0)
Platelets: 294 10*3/uL (ref 150–400)
RBC: 4.56 MIL/uL (ref 3.87–5.11)
WBC: 5.3 10*3/uL (ref 4.0–10.5)

## 2012-08-29 LAB — URINALYSIS, ROUTINE W REFLEX MICROSCOPIC
Glucose, UA: NEGATIVE mg/dL
Hgb urine dipstick: NEGATIVE
Protein, ur: NEGATIVE mg/dL
Specific Gravity, Urine: <1.005 — ABNORMAL LOW (ref 1.005–1.030)

## 2012-08-29 MED ORDER — ONDANSETRON HCL 4 MG/2ML IJ SOLN
4.0000 mg | Freq: Once | INTRAMUSCULAR | Status: DC
  Filled 2012-08-29: qty 2

## 2012-08-29 NOTE — ED Notes (Signed)
Pt c/o abd pain and had syncopal episode tonight and ems suggested she come and get checked out. Pt was comforting her daughter when she passed out.

## 2012-08-29 NOTE — ED Provider Notes (Signed)
History   This chart was scribed for [employer]. Colon Branch, MD by Sofie Rower, ED Scribe. The patient was seen in room APA05/APA05 and the patient's care was started at 11:13PM    CSN: [id number]  Arrival date & time 08/29/12  2159   None     Chief Complaint  Patient presents with  . Loss of Consciousness    (Consider location/radiation/quality/duration/timing/severity/associated sxs/prior treatment) The history is provided by the patient and the spouse. No language interpreter was used.    Kristine Lawson is a 42 y.o. female , with a hx of hypertension and arthritis, who presents to the Emergency Department complaining of sudden, progressively improving, LOC, onset today (08/29/12).  Associated symptoms include headache, nausea, and diffuse abdominal pain. The pt reports she suddenly experienced a feeling of lightheadedness earlier this evening, where she immediately sat down and proceeded to loose consciousness.  The pt denies vomiting and diarrhea.    The pt does not smoke or drink alcohol.   PCP is Dr. Jeanice Lim.    Past Medical History  Diagnosis Date  . Hypertension   . Allergy   . Arthritis     Past Surgical History  Procedure Date  . Appendectomy   . Tubal ligation   . Cholecystectomy   . Ablasion     Family History  Problem Relation Age of Onset  . Hypertension Mother   . Diabetes Mother   . Kidney disease Mother   . Hyperlipidemia Mother   . Cancer Mother     breast  . Kidney disease Father   . Stroke Father   . Heart disease Father   . Hyperlipidemia Father   . Hypertension Brother     History  Substance Use Topics  . Smoking status: Never Smoker   . Smokeless tobacco: Not on file  . Alcohol Use: Not on file    OB History    Grav Para Term Preterm Abortions TAB SAB Ect Mult Living                  Review of Systems  10 Systems reviewed and all are negative for acute change except as noted in the HPI.    Allergies  Aspirin  Home Medications    Current Outpatient Rx  Name  Route  Sig  Dispense  Refill  . ACYCLOVIR 400 MG PO TABS   Oral   Take 400 mg by mouth as needed.         Marland Kitchen BIOTIN 5000 MCG PO CAPS   Oral   Take by mouth. Two capsules daily         . HYDROXYZINE HCL 50 MG PO TABS      One to two tablets at bedtime   60 tablet   0   . MELATONIN 3 MG PO CAPS   Oral   Take by mouth. Two tablets q hs         . NAPROXEN 500 MG PO TABS   Oral   Take 1 tablet (500 mg total) by mouth 2 (two) times daily with a meal.   180 tablet   0   . TRIAMTERENE-HCTZ 37.5-25 MG PO TABS   Oral   Take 1 each (1 tablet total) by mouth daily.   90 tablet   1     Discontinue benicar /hctz effective 02/28/2012     BP 129/79  Pulse 59  Temp 97.8 F (36.6 C) (Oral)  Resp 20  Ht 5\' 7"  (1.702 m)  Wt 211 lb (95.709 kg)  BMI 33.05 kg/m2  SpO2 100%  Physical Exam  Nursing note and vitals reviewed. Constitutional: She is oriented to person, place, and time. She appears well-developed and well-nourished.  HENT:  Head: Atraumatic.  Nose: Nose normal.  Eyes: Conjunctivae normal and EOM are normal. Pupils are equal, round, and reactive to light. Right eye exhibits no discharge. Left eye exhibits no discharge. No scleral icterus.  Neck: Normal range of motion.  Cardiovascular: Normal rate, regular rhythm and normal heart sounds.   Pulmonary/Chest: Effort normal and breath sounds normal. No respiratory distress. She has no wheezes.  Abdominal: Soft. Bowel sounds are normal.  Musculoskeletal: Normal range of motion.  Neurological: She is alert and oriented to person, place, and time. No cranial nerve deficit.  Skin: Skin is warm and dry.  Psychiatric: She has a normal mood and affect. Her behavior is normal.    ED Course  Procedures (including critical care time) Results for orders placed during the hospital encounter of 08/29/12  CBC WITH DIFFERENTIAL      Component Value Range   WBC 5.3  4.0 - 10.5 K/uL   RBC 4.56   3.87 - 5.11 MIL/uL   Hemoglobin 13.5  12.0 - 15.0 g/dL   HCT 75.6  43.3 - 29.5 %   MCV 87.9  78.0 - 100.0 fL   MCH 29.6  26.0 - 34.0 pg   MCHC 33.7  30.0 - 36.0 g/dL   RDW 18.8  41.6 - 60.6 %   Platelets 294  150 - 400 K/uL   Neutrophils Relative 47  43 - 77 %   Neutro Abs 2.5  1.7 - 7.7 K/uL   Lymphocytes Relative 41  12 - 46 %   Lymphs Abs 2.2  0.7 - 4.0 K/uL   Monocytes Relative 9  3 - 12 %   Monocytes Absolute 0.5  0.1 - 1.0 K/uL   Eosinophils Relative 3  0 - 5 %   Eosinophils Absolute 0.2  0.0 - 0.7 K/uL   Basophils Relative 0  0 - 1 %   Basophils Absolute 0.0  0.0 - 0.1 K/uL  URINALYSIS, ROUTINE W REFLEX MICROSCOPIC      Component Value Range   Color, Urine YELLOW  YELLOW   APPearance CLEAR  CLEAR   Specific Gravity, Urine <1.005 (*) 1.005 - 1.030   pH 7.0  5.0 - 8.0   Glucose, UA NEGATIVE  NEGATIVE mg/dL   Hgb urine dipstick NEGATIVE  NEGATIVE   Bilirubin Urine NEGATIVE  NEGATIVE   Ketones, ur NEGATIVE  NEGATIVE mg/dL   Protein, ur NEGATIVE  NEGATIVE mg/dL   Urobilinogen, UA 0.2  0.0 - 1.0 mg/dL   Nitrite NEGATIVE  NEGATIVE   Leukocytes, UA NEGATIVE  NEGATIVE   DIAGNOSTIC STUDIES: Oxygen Saturation is 100% on room air, normal by my interpretation.    COORDINATION OF CARE:  11:15 PM- Treatment plan concerning discussed with patient. Pt agrees with treatment.  11:19PM- Pt's relative requests evaluation of pt's eyes.  11:20PM- Evaluation of pt's eyes performed.       Date: 08/29/2012    2349  Rate: 62  Rhythm: normal sinus rhythm  QRS Axis: normal  Intervals: normal  ST/T Wave abnormalities: normal  Conduction Disutrbances:none  Narrative Interpretation:   Old EKG Reviewed: none available       MDM  Patient without symptoms since arrival. Up to the bathroom. Has taken PO fluids. Received IVF and zofran. Reviewed results with patient  and her husband. Pt stable in ED with no significant deterioration in condition.The patient appears reasonably screened  and/or stabilized for discharge and I doubt any other medical condition or other [facility] requiring further screening, evaluation, or treatment in the ED at this time prior to discharge I personally performed the services described in this documentation, which was scribed in my presence. The recorded information has been reviewed and considered.   .MDM Reviewed: nursing note and vitals Interpretation: labs, ECG and x-ray           Nicoletta Dress. Colon Branch, MD 08/30/12 [phone]

## 2012-08-30 LAB — BASIC METABOLIC PANEL
CO2: 32 mEq/L (ref 19–32)
Calcium: 10 mg/dL (ref 8.4–10.5)
Glucose, Bld: 105 mg/dL — ABNORMAL HIGH (ref 70–99)
Sodium: 137 mEq/L (ref 135–145)

## 2012-08-30 MED ORDER — ONDANSETRON HCL 4 MG PO TABS: 4.0000 mg | ORAL_TABLET | Freq: Four times a day (QID) | ORAL | Status: DC

## 2012-08-30 NOTE — Discharge Instructions (Signed)
Your labs, heart numbers, EKG and chest xray were normal here tonight. If you should have more attacks of fainting, return to the ER.

## 2012-09-06 ENCOUNTER — Ambulatory Visit: Payer: 59 | Admitting: Family Medicine

## 2012-09-19 ENCOUNTER — Other Ambulatory Visit: Payer: Self-pay

## 2012-09-19 DIAGNOSIS — L299 Pruritus, unspecified: Secondary | ICD-10-CM

## 2012-09-19 MED ORDER — NAPROXEN 500 MG PO TABS: 500.0000 mg | ORAL_TABLET | Freq: Two times a day (BID) | ORAL | Status: DC

## 2012-09-19 MED ORDER — HYDROXYZINE HCL 50 MG PO TABS: ORAL_TABLET | ORAL | Status: DC

## 2012-10-01 ENCOUNTER — Ambulatory Visit: Payer: 59 | Admitting: Family Medicine

## 2012-10-22 ENCOUNTER — Ambulatory Visit: Payer: 59

## 2012-10-31 ENCOUNTER — Other Ambulatory Visit: Payer: Self-pay

## 2012-10-31 DIAGNOSIS — I10 Essential (primary) hypertension: Secondary | ICD-10-CM

## 2012-10-31 MED ORDER — TRIAMTERENE-HCTZ 37.5-25 MG PO TABS: 1.0000 | ORAL_TABLET | Freq: Every day | ORAL | Status: DC

## 2012-11-13 ENCOUNTER — Other Ambulatory Visit: Payer: Self-pay

## 2012-11-13 DIAGNOSIS — Z1231 Encounter for screening mammogram for malignant neoplasm of breast: Secondary | ICD-10-CM

## 2012-12-21 ENCOUNTER — Encounter: Payer: Self-pay | Admitting: Family Medicine

## 2012-12-21 ENCOUNTER — Ambulatory Visit: Payer: 59

## 2012-12-21 ENCOUNTER — Ambulatory Visit: Payer: 59 | Admitting: Family Medicine

## 2012-12-21 ENCOUNTER — Ambulatory Visit (INDEPENDENT_AMBULATORY_CARE_PROVIDER_SITE_OTHER): Payer: 59 | Admitting: Family Medicine

## 2012-12-21 VITALS — BP 120/78 | HR 78 | Resp 18 | Ht 69.0 in | Wt 226.1 lb

## 2012-12-21 DIAGNOSIS — Z13220 Encounter for screening for lipoid disorders: Secondary | ICD-10-CM

## 2012-12-21 DIAGNOSIS — I10 Essential (primary) hypertension: Secondary | ICD-10-CM

## 2012-12-21 DIAGNOSIS — Z733 Stress, not elsewhere classified: Secondary | ICD-10-CM

## 2012-12-21 DIAGNOSIS — Z658 Other specified problems related to psychosocial circumstances: Secondary | ICD-10-CM | POA: Insufficient documentation

## 2012-12-21 DIAGNOSIS — Z130 Encounter for screening for diseases of the blood and blood-forming organs and certain disorders involving the immune mechanism: Secondary | ICD-10-CM

## 2012-12-21 DIAGNOSIS — E669 Obesity, unspecified: Secondary | ICD-10-CM

## 2012-12-21 DIAGNOSIS — Z1321 Encounter for screening for nutritional disorder: Secondary | ICD-10-CM

## 2012-12-21 DIAGNOSIS — N951 Menopausal and female climacteric states: Secondary | ICD-10-CM

## 2012-12-21 DIAGNOSIS — L299 Pruritus, unspecified: Secondary | ICD-10-CM

## 2012-12-21 MED ORDER — PHENTERMINE HCL 37.5 MG PO TABS: 37.5000 mg | ORAL_TABLET | Freq: Every day | ORAL | Status: DC

## 2012-12-21 NOTE — Assessment & Plan Note (Signed)
Controlled, no change in medication DASH diet and commitment to daily physical activity for a minimum of 30 minutes discussed and encouraged, as a part of hypertension management. The importance of attaining a healthy weight is also discussed.  

## 2012-12-21 NOTE — Assessment & Plan Note (Signed)
Ongoing personal stress, primarily due to problems with adult children. Pt to seek counseling through employee health

## 2012-12-21 NOTE — Assessment & Plan Note (Signed)
Ongoing, intolerant of effexor states caused gI distress and has stopped same

## 2012-12-21 NOTE — Patient Instructions (Addendum)
F/u in 4 month, pls call if you need me before  Weight loss goal of 4 to 5 pounds per month, stay within a 1500 calorie per day diet.  Commit to walking 3 miles per day, oK to do 1 mile 3 times daily, split it however you want to, and have fun!   pnly new med is phentermine one daily  Fasting lipid,  TSH, HBA1C and vit D as soon as possible

## 2012-12-21 NOTE — Assessment & Plan Note (Signed)
Deteriorated. Patient re-educated about  the importance of commitment to a  minimum of 150 minutes of exercise per week. The importance of healthy food choices with portion control discussed. Encouraged to start a food diary, count calories and to consider  joining a support group. Sample diet sheets offered. Goals set by the patient for the next several months.   Pt will start phentermine, and is motivated to following diet and exercise program

## 2012-12-21 NOTE — Assessment & Plan Note (Signed)
Relies on hydroxyzine for symptom control and assistance with sleep

## 2012-12-21 NOTE — Progress Notes (Signed)
  Subjective:    Patient ID: Kristine Lawson, female    DOB: 05-02-1971, 42 y.o.   MRN: [id number]  HPI The PT is here for follow up and re-evaluation of chronic medical conditions, medication management and review of any available recent lab and radiology data.  Preventive health is updated, specifically  Cancer screening and Immunization.   Stopped effexor, stated that it caused GI distress, she will live with the hot flashes instead, drinking a lot of water Distressed over excessive weight gain, requesting phentermine , which has helped in the past, will partner with her spouse in this venture,  As far as exercise and changing/controlling her diet. Unfortunately stress and anxiety, is a major factor in her weight issue, she has made progress in this area , and is willing to continue to move forward with this      Review of Systems See HPI Denies recent fever or chills. Denies sinus pressure, nasal congestion, ear pain or sore throat. Denies chest congestion, productive cough or wheezing. Denies chest pains, palpitations and leg swelling Denies abdominal pain, nausea, vomiting,diarrhea or constipation.   Denies dysuria, frequency, hesitancy or incontinence. Denies joint pain, swelling and limitation in mobility. Denies headaches, seizures, numbness, or tingling. Denies depression, uses sleep aid for insomnia Denies skin break down or rash.        Objective:   Physical Exam  Patient alert and oriented and in no cardiopulmonary distress.  HEENT: No facial asymmetry, EOMI, no sinus tenderness,  oropharynx pink and moist.  Neck supple no adenopathy.  Chest: Clear to auscultation bilaterally.  CVS: S1, S2 no murmurs, no S3.  ABD: Soft non tender. Bowel sounds normal.  Ext: No edema  MS: Adequate ROM spine, shoulders, hips and knees.  Skin: Intact, no ulcerations or rash noted.  Psych: Good eye contact, normal affect. Memory intact not anxious or depressed appearing.At  times , tearful as she speaks of past hurts  CNS: CN 2-12 intact, power, tone and sensation normal throughout.       Assessment & Plan:

## 2012-12-27 ENCOUNTER — Other Ambulatory Visit: Payer: Self-pay | Admitting: Family Medicine

## 2012-12-27 ENCOUNTER — Encounter: Payer: Self-pay | Admitting: Family Medicine

## 2012-12-27 ENCOUNTER — Other Ambulatory Visit: Payer: Self-pay

## 2012-12-27 MED ORDER — ACYCLOVIR 400 MG PO TABS: 400.0000 mg | ORAL_TABLET | ORAL | Status: DC | PRN

## 2012-12-28 ENCOUNTER — Other Ambulatory Visit: Payer: Self-pay | Admitting: Family Medicine

## 2012-12-28 MED ORDER — TRIAMTERENE-HCTZ 37.5-25 MG PO TABS: ORAL_TABLET | ORAL | Status: DC

## 2012-12-28 NOTE — Telephone Encounter (Signed)
Med refill

## 2013-01-11 ENCOUNTER — Other Ambulatory Visit: Payer: Self-pay | Admitting: Family Medicine

## 2013-01-29 ENCOUNTER — Ambulatory Visit: Payer: 59

## 2013-03-22 ENCOUNTER — Other Ambulatory Visit: Payer: Self-pay | Admitting: Family Medicine

## 2013-04-02 ENCOUNTER — Other Ambulatory Visit: Payer: Self-pay | Admitting: Family Medicine

## 2013-05-13 ENCOUNTER — Telehealth (HOSPITAL_COMMUNITY): Payer: Self-pay | Admitting: Dietician

## 2013-05-13 NOTE — Telephone Encounter (Signed)
Received message from pt left at [phone]. Pt is a self referral. 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

## 2013-05-13 NOTE — Telephone Encounter (Signed)
Called back at 1527. Appointment scheduled for 05/16/2013 at 1100.

## 2013-05-13 NOTE — Telephone Encounter (Signed)
Received call at 1610. Pt reports is unable to attend scheduled appointment. Appointment rescheduled for 05/22/13 at 1400.

## 2013-05-22 ENCOUNTER — Telehealth (HOSPITAL_COMMUNITY): Payer: Self-pay | Admitting: Dietician

## 2013-05-22 NOTE — Telephone Encounter (Signed)
Pt was a no-show for appointment scheduled for 05/22/2013 at 1400.

## 2013-05-30 ENCOUNTER — Other Ambulatory Visit: Payer: Self-pay

## 2013-06-21 ENCOUNTER — Other Ambulatory Visit: Payer: Self-pay | Admitting: Family Medicine

## 2013-06-26 ENCOUNTER — Other Ambulatory Visit: Payer: Self-pay | Admitting: Family Medicine

## 2013-06-26 ENCOUNTER — Encounter: Payer: Self-pay | Admitting: Family Medicine

## 2013-06-27 ENCOUNTER — Other Ambulatory Visit: Payer: Self-pay

## 2013-06-28 ENCOUNTER — Encounter: Payer: Self-pay | Admitting: Family Medicine

## 2013-06-28 ENCOUNTER — Other Ambulatory Visit: Payer: Self-pay | Admitting: Family Medicine

## 2013-06-28 ENCOUNTER — Other Ambulatory Visit: Payer: Self-pay

## 2013-06-28 DIAGNOSIS — E669 Obesity, unspecified: Secondary | ICD-10-CM

## 2013-06-28 MED ORDER — PHENTERMINE HCL 37.5 MG PO TABS: 37.5000 mg | ORAL_TABLET | Freq: Every day | ORAL | Status: DC

## 2013-08-30 ENCOUNTER — Encounter: Payer: Self-pay | Admitting: Family Medicine

## 2013-08-30 ENCOUNTER — Other Ambulatory Visit: Payer: Self-pay | Admitting: Family Medicine

## 2013-08-30 ENCOUNTER — Ambulatory Visit (INDEPENDENT_AMBULATORY_CARE_PROVIDER_SITE_OTHER): Payer: 59 | Admitting: Family Medicine

## 2013-08-30 VITALS — BP 130/92 | HR 79 | Resp 16 | Ht 67.0 in | Wt 246.0 lb

## 2013-08-30 DIAGNOSIS — Z658 Other specified problems related to psychosocial circumstances: Secondary | ICD-10-CM

## 2013-08-30 DIAGNOSIS — Z733 Stress, not elsewhere classified: Secondary | ICD-10-CM

## 2013-08-30 DIAGNOSIS — N920 Excessive and frequent menstruation with regular cycle: Secondary | ICD-10-CM

## 2013-08-30 DIAGNOSIS — Z0000 Encounter for general adult medical examination without abnormal findings: Secondary | ICD-10-CM

## 2013-08-30 DIAGNOSIS — F411 Generalized anxiety disorder: Secondary | ICD-10-CM | POA: Insufficient documentation

## 2013-08-30 DIAGNOSIS — R7301 Impaired fasting glucose: Secondary | ICD-10-CM

## 2013-08-30 DIAGNOSIS — E669 Obesity, unspecified: Secondary | ICD-10-CM

## 2013-08-30 DIAGNOSIS — I10 Essential (primary) hypertension: Secondary | ICD-10-CM

## 2013-08-30 DIAGNOSIS — Z79899 Other long term (current) drug therapy: Secondary | ICD-10-CM

## 2013-08-30 DIAGNOSIS — Z23 Encounter for immunization: Secondary | ICD-10-CM

## 2013-08-30 DIAGNOSIS — M25569 Pain in unspecified knee: Secondary | ICD-10-CM

## 2013-08-30 DIAGNOSIS — L299 Pruritus, unspecified: Secondary | ICD-10-CM

## 2013-08-30 MED ORDER — ALPRAZOLAM 0.5 MG PO TABS
ORAL_TABLET | ORAL | Status: DC
Start: 2013-08-30 — End: 2014-02-25

## 2013-08-30 NOTE — Patient Instructions (Addendum)
F/u in 3.5 month, call if you need me before  Lifestyle changes as discussed    It is important that you exercise regularly at least 30 minutes 7 times a week. If you develop chest pain, have severe difficulty breathing, or feel very tired, stop exercising immediately and seek medical attention   Follow dash diet.  Reduce portion size and eat regularly  Safety, as we discussed  Stool card to be returned  Weight loss goal of 2.5 to 3 pounds per month, you can do this.  New additional medication at night to help with sleep  DASH Diet The DASH diet stands for "Dietary Approaches to Stop Hypertension." It is a healthy eating plan that has been shown to reduce high blood pressure (hypertension) in as little as 14 days, while also possibly providing other significant health benefits. These other health benefits include reducing the risk of breast cancer after menopause and reducing the risk of type 2 diabetes, heart disease, colon cancer, and stroke. Health benefits also include weight loss and slowing kidney failure in patients with chronic kidney disease.  DIET GUIDELINES  Limit salt (sodium). Your diet should contain less than 1500 mg of sodium daily.  Limit refined or processed carbohydrates. Your diet should include mostly whole grains. Desserts and added sugars should be used sparingly.  Include small amounts of heart-healthy fats. These types of fats include nuts, oils, and tub margarine. Limit saturated and trans fats. These fats have been shown to be harmful in the body. CHOOSING FOODS  The following food groups are based on a 2000 calorie diet. See your Registered Dietitian for individual calorie needs. Grains and Grain Products (6 to 8 servings daily)  Eat More Often: Whole-wheat bread, brown rice, whole-grain or wheat pasta, quinoa, popcorn without added fat or salt (air popped).  Eat Less Often: White bread, white pasta, white rice, cornbread. Vegetables (4 to 5 servings  daily)  Eat More Often: Fresh, frozen, and canned vegetables. Vegetables may be raw, steamed, roasted, or grilled with a minimal amount of fat.  Eat Less Often/Avoid: Creamed or fried vegetables. Vegetables in a cheese sauce. Fruit (4 to 5 servings daily)  Eat More Often: All fresh, canned (in natural juice), or frozen fruits. Dried fruits without added sugar. One hundred percent fruit juice ( cup [237 mL] daily).  Eat Less Often: Dried fruits with added sugar. Canned fruit in light or heavy syrup. [employer], Fish, and Poultry (2 servings or less daily. One serving is 3 to 4 oz [85-114 g]).  Eat More Often: Ninety percent or leaner ground beef, tenderloin, sirloin. Round cuts of beef, chicken breast, [place] breast. All fish. Grill, bake, or broil your meat. Nothing should be fried.  Eat Less Often/Avoid: Fatty cuts of meat, [place], or chicken leg, thigh, or wing. Fried cuts of meat or fish. Dairy (2 to 3 servings)  Eat More Often: Low-fat or fat-free milk, low-fat plain or light yogurt, reduced-fat or part-skim cheese.  Eat Less Often/Avoid: Milk (whole, 2%).Whole milk yogurt. Full-fat cheeses. Nuts, Seeds, and Legumes (4 to 5 servings per week)  Eat More Often: All without added salt.  Eat Less Often/Avoid: Salted nuts and seeds, canned beans with added salt. Fats and Sweets (limited)  Eat More Often: Vegetable oils, tub margarines without trans fats, sugar-free gelatin. Mayonnaise and salad dressings.  Eat Less Often/Avoid: Coconut oils, palm oils, butter, stick margarine, cream, half and half, cookies, candy, pie. FOR MORE INFORMATION The Dash Diet Eating Plan: www.dashdiet.org Document  Released: 06/30/2011 Document Revised: 10/03/2011 Document Reviewed: 06/30/2011 [facility] Patient Information 2014 [place], [place].    Fasting labs today  TdAP vaccine today

## 2013-08-31 DIAGNOSIS — Z0000 Encounter for general adult medical examination without abnormal findings: Secondary | ICD-10-CM | POA: Insufficient documentation

## 2013-08-31 NOTE — Assessment & Plan Note (Signed)
Elevated and uncontrolled. No med change lifestyle change sufficient to improve diastolic pressure, commitment made

## 2013-08-31 NOTE — Progress Notes (Signed)
   Subjective:    Patient ID: Kristine Lawson, female    DOB: [birth year], 43 y.o.   MRN: [id number]  HPI Patient is in for annual exam Health maintainance is reviewed and updated, specifically screening tests and recommended immunizations. Recent lab and radiologic data, since previous visit is also reviewed with the patient. Healthy lifestyle as far as commitment to regular physical activity, heart healthy diet , safe habits, as far as seat belt and helmet use,safe and responsible sex involving condom and contraceptive use , also safe storage of firearms is discussed. The importance of adequate rest is also discussed. Health concerns today are poor sleep despite high doses of hydroxyzine, awakens  With anxiety. Another concern is excessive weight gain and poor lifestyle as no commitment to exercise, has had a stressful year as far as 2 of her 3 children are concerned, and the stress is far from over. Coping strategies are discussed       Review of Systems See HPI Denies recent fever or chills. Denies sinus pressure, nasal congestion, ear pain or sore throat. Denies chest congestion, productive cough or wheezing. Denies chest pains, palpitations and leg swelling Denies abdominal pain, nausea, vomiting,diarrhea or constipation.   Denies dysuria, frequency, hesitancy or incontinence. Bilateral knee pain, right worse than left without daily aleve Denies headaches, seizures, numbness, or tingling.Hot sweats have lessened  Denies skin break down or rash.Acne is controlled        Objective:   Physical Exam BP 130/92  Pulse 79  Resp 16  Ht 5\' 7"  (1.702 m)  Wt 246 lb (111.585 kg)  BMI 38.52 kg/m2  SpO2 99%   Patient alert and oriented and in no cardiopulmonary distress.  HEENT: No facial asymmetry, EOMI, no sinus tenderness,  oropharynx pink and moist.  Neck supple no adenopathy.  Chest: Clear to auscultation bilaterally.  CVS: S1, S2 no murmurs, no S3.  ABD: Soft non  tender. Bowel sounds normal.  Ext: No edema  MS: Adequate ROM spine, shoulders, hips and knees.  Skin: Intact, no ulcerations or rash noted.  Psych: Good eye contact, normal affect. Memory intact not anxious or depressed appearing.  CNS: CN 2-12 intact, power, tone and sensation normal throughout.       Assessment & Plan:

## 2013-08-31 NOTE — Progress Notes (Signed)
   Subjective:    Patient ID: Kristine Lawson, female    DOB: 1971/03/05, 43 y.o.   MRN: [id number]  HPI    Review of Systems     Objective:   Physical Exam        Assessment & Plan:  HYPERTENSION, BENIGN ESSENTIAL Elevated and uncontrolled. No med change lifestyle change sufficient to improve diastolic pressure, commitment made  Anxiety state, unspecified Increased due to problems with children, affexcting her ability to stay asleep, add xanax, hopefully this will be short term only  Psychosocial stressors Ongoing issues, good support from spouse   Obesity (BMI 30-39.9) Deteriorated. Patient re-educated about  the importance of commitment to a  minimum of 150 minutes of exercise per week. The importance of healthy food choices with portion control discussed. Encouraged to start a food diary, count calories and to consider  joining a support group. Sample diet sheets offered. Goals set by the patient for the next several months.     KNEE PAIN, RIGHT Daily  Use of aleve aids, weight loss would be benficial  Routine general medical examination at a health care facility Annual exam as documented. Counseling done  re healthy lifestyle involving commitment to 150 minutes exercise per week, heart healthy diet, and attaining healthy weight.The importance of adequate sleep also discussed. Regular seat belt use and safe storage  of firearms if patient has them, is also discussed. Changes in health habits are decided on by the patient with goals and time frames  set for achieving them. Immunization and cancer screening needs are specifically addressed at this visit.

## 2013-08-31 NOTE — Assessment & Plan Note (Signed)
Daily  Use of aleve aids, weight loss would be benficial

## 2013-08-31 NOTE — Assessment & Plan Note (Signed)
Annual exam as documented. Counseling done  re healthy lifestyle involving commitment to 150 minutes exercise per week, heart healthy diet, and attaining healthy weight.The importance of adequate sleep also discussed. Regular seat belt use and safe storage  of firearms if patient has them, is also discussed. Changes in health habits are decided on by the patient with goals and time frames  set for achieving them. Immunization and cancer screening needs are specifically addressed at this visit.  

## 2013-08-31 NOTE — Assessment & Plan Note (Signed)
Deteriorated. Patient re-educated about  the importance of commitment to a  minimum of 150 minutes of exercise per week. The importance of healthy food choices with portion control discussed. Encouraged to start a food diary, count calories and to consider  joining a support group. Sample diet sheets offered. Goals set by the patient for the next several months.    

## 2013-08-31 NOTE — Assessment & Plan Note (Signed)
Ongoing issues, good support from spouse

## 2013-08-31 NOTE — Assessment & Plan Note (Signed)
Increased due to problems with children, affexcting her ability to stay asleep, add xanax, hopefully this will be short term only

## 2013-09-02 ENCOUNTER — Other Ambulatory Visit: Payer: Self-pay

## 2013-09-02 DIAGNOSIS — L299 Pruritus, unspecified: Secondary | ICD-10-CM

## 2013-09-02 MED ORDER — HYDROXYZINE HCL 50 MG PO TABS: ORAL_TABLET | ORAL | Status: DC

## 2013-10-16 LAB — CBC WITH DIFFERENTIAL/PLATELET
BASOS ABS: 0 10*3/uL (ref 0.0–0.1)
BASOS PCT: 0 % (ref 0–1)
EOS ABS: 0.3 10*3/uL (ref 0.0–0.7)
Eosinophils Relative: 5 % (ref 0–5)
HCT: 39 % (ref 36.0–46.0)
HEMOGLOBIN: 13.5 g/dL (ref 12.0–15.0)
Lymphocytes Relative: 54 % — ABNORMAL HIGH (ref 12–46)
Lymphs Abs: 2.8 10*3/uL (ref 0.7–4.0)
MCH: 28.8 pg (ref 26.0–34.0)
MCHC: 34.6 g/dL (ref 30.0–36.0)
MCV: 83.2 fL (ref 78.0–100.0)
MONOS PCT: 9 % (ref 3–12)
Monocytes Absolute: 0.5 10*3/uL (ref 0.1–1.0)
NEUTROS ABS: 1.7 10*3/uL (ref 1.7–7.7)
NEUTROS PCT: 32 % — AB (ref 43–77)
PLATELETS: 354 10*3/uL (ref 150–400)
RBC: 4.69 MIL/uL (ref 3.87–5.11)
RDW: 14.2 % (ref 11.5–15.5)
WBC: 5.2 10*3/uL (ref 4.0–10.5)

## 2013-10-16 LAB — BASIC METABOLIC PANEL
BUN: 16 mg/dL (ref 6–23)
CHLORIDE: 104 meq/L (ref 96–112)
CO2: 29 meq/L (ref 19–32)
CREATININE: 0.77 mg/dL (ref 0.50–1.10)
Calcium: 9.6 mg/dL (ref 8.4–10.5)
Glucose, Bld: 86 mg/dL (ref 70–99)
POTASSIUM: 4.8 meq/L (ref 3.5–5.3)
Sodium: 139 mEq/L (ref 135–145)

## 2013-10-16 LAB — LIPID PANEL
CHOLESTEROL: 172 mg/dL (ref 0–200)
HDL: 47 mg/dL (ref >39)
LDL Cholesterol: 113 mg/dL — ABNORMAL HIGH (ref 0–99)
TRIGLYCERIDES: 61 mg/dL (ref <150)
Total CHOL/HDL Ratio: 3.7 Ratio
VLDL: 12 mg/dL (ref 0–40)

## 2013-10-16 LAB — TSH: TSH: 0.897 u[IU]/mL (ref 0.350–4.500)

## 2013-10-16 LAB — HEMOGLOBIN A1C
HEMOGLOBIN A1C: 5.7 % — AB (ref <5.7)
Mean Plasma Glucose: 117 mg/dL — ABNORMAL HIGH (ref <117)

## 2013-10-17 ENCOUNTER — Encounter: Payer: Self-pay | Admitting: Family Medicine

## 2013-10-18 ENCOUNTER — Ambulatory Visit
Admission: RE | Admit: 2013-10-18 | Discharge: 2013-10-18 | Disposition: A | Discharge status: Home / Self Care | Payer: Self-pay | Source: Ambulatory Visit

## 2013-10-18 ENCOUNTER — Ambulatory Visit: Payer: 59

## 2013-10-18 DIAGNOSIS — Z1231 Encounter for screening mammogram for malignant neoplasm of breast: Secondary | ICD-10-CM

## 2013-10-22 ENCOUNTER — Encounter: Payer: Self-pay | Admitting: Family Medicine

## 2013-10-28 ENCOUNTER — Ambulatory Visit: Payer: 59

## 2013-12-23 ENCOUNTER — Ambulatory Visit: Payer: 59 | Admitting: Family Medicine

## 2014-02-25 ENCOUNTER — Telehealth: Payer: Self-pay | Admitting: Family Medicine

## 2014-02-25 ENCOUNTER — Other Ambulatory Visit: Payer: Self-pay

## 2014-02-25 DIAGNOSIS — I10 Essential (primary) hypertension: Secondary | ICD-10-CM

## 2014-02-25 DIAGNOSIS — F411 Generalized anxiety disorder: Secondary | ICD-10-CM

## 2014-02-25 DIAGNOSIS — R7301 Impaired fasting glucose: Secondary | ICD-10-CM

## 2014-02-25 MED ORDER — ALPRAZOLAM 0.5 MG PO TABS: ORAL_TABLET | ORAL | Status: DC

## 2014-02-25 NOTE — Telephone Encounter (Signed)
Advise  30 xanax only with one refil;l only. Needs appt to see about herself , I know she is under a lot of stress.  Needs updated hBA1C and chem 7 before appt which needs to be made for September . Pls reprint the script  For xanax, which gave up to 4 months, contact pt an explain and order labs needed, thanks

## 2014-02-26 MED ORDER — ALPRAZOLAM 0.5 MG PO TABS: ORAL_TABLET | ORAL | Status: DC

## 2014-02-26 NOTE — Telephone Encounter (Signed)
Patient aware and appt scheduled.

## 2014-02-26 NOTE — Addendum Note (Signed)
Addended by: Eual Fines on: 02/26/2014 01:45 PM   Modules accepted: Orders

## 2014-04-04 ENCOUNTER — Ambulatory Visit (INDEPENDENT_AMBULATORY_CARE_PROVIDER_SITE_OTHER): Payer: 59 | Admitting: Family Medicine

## 2014-04-04 ENCOUNTER — Other Ambulatory Visit: Payer: Self-pay | Admitting: Family Medicine

## 2014-04-04 ENCOUNTER — Encounter: Payer: Self-pay | Admitting: Family Medicine

## 2014-04-04 VITALS — BP 124/84 | HR 74 | Resp 16 | Ht 67.0 in | Wt 242.4 lb

## 2014-04-04 DIAGNOSIS — E8881 Metabolic syndrome: Secondary | ICD-10-CM

## 2014-04-04 DIAGNOSIS — E785 Hyperlipidemia, unspecified: Secondary | ICD-10-CM

## 2014-04-04 DIAGNOSIS — L299 Pruritus, unspecified: Secondary | ICD-10-CM

## 2014-04-04 DIAGNOSIS — G4700 Insomnia, unspecified: Secondary | ICD-10-CM

## 2014-04-04 DIAGNOSIS — R7302 Impaired glucose tolerance (oral): Secondary | ICD-10-CM

## 2014-04-04 DIAGNOSIS — E559 Vitamin D deficiency, unspecified: Secondary | ICD-10-CM

## 2014-04-04 DIAGNOSIS — R7309 Other abnormal glucose: Secondary | ICD-10-CM

## 2014-04-04 DIAGNOSIS — Z733 Stress, not elsewhere classified: Secondary | ICD-10-CM

## 2014-04-04 DIAGNOSIS — G2581 Restless legs syndrome: Secondary | ICD-10-CM | POA: Insufficient documentation

## 2014-04-04 DIAGNOSIS — E669 Obesity, unspecified: Secondary | ICD-10-CM

## 2014-04-04 DIAGNOSIS — I10 Essential (primary) hypertension: Secondary | ICD-10-CM

## 2014-04-04 DIAGNOSIS — Z658 Other specified problems related to psychosocial circumstances: Secondary | ICD-10-CM

## 2014-04-04 LAB — BASIC METABOLIC PANEL
BUN: 14 mg/dL (ref 6–23)
CO2: 27 meq/L (ref 19–32)
CREATININE: 0.76 mg/dL (ref 0.50–1.10)
Calcium: 9 mg/dL (ref 8.4–10.5)
Chloride: 103 mEq/L (ref 96–112)
Glucose, Bld: 83 mg/dL (ref 70–99)
POTASSIUM: 4.1 meq/L (ref 3.5–5.3)
SODIUM: 137 meq/L (ref 135–145)

## 2014-04-04 LAB — HEMOGLOBIN A1C
Hgb A1c MFr Bld: 5.8 % — ABNORMAL HIGH (ref <5.7)
MEAN PLASMA GLUCOSE: 120 mg/dL — AB (ref <117)

## 2014-04-04 MED ORDER — CYCLOBENZAPRINE HCL 10 MG PO TABS: 10.0000 mg | ORAL_TABLET | Freq: Every day | ORAL | Status: DC

## 2014-04-04 MED ORDER — GABAPENTIN 300 MG PO CAPS
300.0000 mg | ORAL_CAPSULE | Freq: Every day | ORAL | Status: DC
Start: 2014-04-04 — End: 2015-02-12

## 2014-04-04 NOTE — Patient Instructions (Signed)
F/u in 4 month, call if you need me before  Labs today  New medications as discussed for restless legs and sleep

## 2014-04-05 ENCOUNTER — Encounter: Payer: Self-pay | Admitting: Family Medicine

## 2014-04-05 DIAGNOSIS — R7302 Impaired glucose tolerance (oral): Secondary | ICD-10-CM | POA: Insufficient documentation

## 2014-04-05 DIAGNOSIS — G4700 Insomnia, unspecified: Secondary | ICD-10-CM | POA: Insufficient documentation

## 2014-04-05 DIAGNOSIS — E8881 Metabolic syndrome: Secondary | ICD-10-CM | POA: Insufficient documentation

## 2014-04-05 DIAGNOSIS — E785 Hyperlipidemia, unspecified: Secondary | ICD-10-CM | POA: Insufficient documentation

## 2014-04-05 DIAGNOSIS — E559 Vitamin D deficiency, unspecified: Secondary | ICD-10-CM | POA: Insufficient documentation

## 2014-04-05 NOTE — Assessment & Plan Note (Signed)
Unchanged. Patient re-educated about  the importance of commitment to a  minimum of 150 minutes of exercise per week. The importance of healthy food choices with portion control discussed. Encouraged to start a food diary, count calories and to consider  joining a support group. Sample diet sheets offered. Goals set by the patient for the next several months.    

## 2014-04-05 NOTE — Assessment & Plan Note (Signed)
Deteriorated Patient educated about the importance of limiting  Carbohydrate intake , the need to commit to daily physical activity for a minimum of 30 minutes , and to commit weight loss. The fact that changes in all these areas will reduce or eliminate all together the development of diabetes is stressed.   \Updated lab needed at/ before next visit.  

## 2014-04-05 NOTE — Assessment & Plan Note (Signed)
Controlled, no change in medication DASH diet and commitment to daily physical activity for a minimum of 30 minutes discussed and encouraged, as a part of hypertension management. The importance of attaining a healthy weight is also discussed.  

## 2014-04-05 NOTE — Assessment & Plan Note (Signed)
Increased, continues to have difficulty with her daughter, dealing with this as best as she is able. Now enrolled in school in [employer] program and working full time also, which is good for her Denies depression, but does admit to anxiety symptoms, however not deemed medically an issue to be treated separately at this point

## 2014-04-05 NOTE — Assessment & Plan Note (Signed)
Hyperlipidemia:Low fat diet discussed and encouraged.  Updated lab needed at/ before next visit.  

## 2014-04-05 NOTE — Progress Notes (Signed)
Subjective:    Patient ID: Kristine Lawson, female    DOB: [birth year], 43 y.o.   MRN: [id number]  HPI The PT is here for follow up and re-evaluation of chronic medical conditions, medication management and review of any available recent lab and radiology data.  Preventive health is updated, specifically  Cancer screening and Immunization.    The PT denies any adverse reactions to current medications since the last visit.  C/o worsening of her insomnia, experiences crawly sensations in her legs even while awake , wants to be treated for restless legs in the hope this will help Tried xanax recently for increased personal stress, no interest in continuing , which is good, now in school 3 nights per week, but not yet committed to exercise, intends to do this however, and needs to commit to dietary change , which she has again started       Review of Systems See HPI Denies recent fever or chills. Denies sinus pressure, nasal congestion, ear pain or sore throat. Denies chest congestion, productive cough or wheezing. Denies chest pains, palpitations and leg swelling Denies abdominal pain, nausea, vomiting,diarrhea or constipation.   Denies dysuria, frequency, hesitancy or incontinence. Denies joint pain, swelling and limitation in mobility. Denies headaches, seizures, numbness, or tingling.  Denies skin break down or rash.        Objective:   Physical Exam BP 124/84  Pulse 74  Resp 16  Ht 5\' 7"  (1.702 m)  Wt 242 lb 6.4 oz (109.952 kg)  BMI 37.96 kg/m2  SpO2 99% Patient alert and oriented and in no cardiopulmonary distress.  HEENT: No facial asymmetry, EOMI,   oropharynx pink and moist.  Neck supple no JVD, no mass.  Chest: Clear to auscultation bilaterally.  CVS: S1, S2 no murmurs, no S3.Regular rate.  ABD: Soft non tender.   Ext: No edema  MS: Adequate ROM spine, shoulders, hips and knees.  Skin: Intact, no ulcerations or rash noted.  Psych: Good eye contact,  normal affect. Memory intact not anxious, tearful at times not  depressed appearing.  CNS: CN 2-12 intact, power,  normal throughout.no focal deficits noted.        Assessment & Plan:  HYPERTENSION, BENIGN ESSENTIAL Controlled, no change in medication DASH diet and commitment to daily physical activity for a minimum of 30 minutes discussed and encouraged, as a part of hypertension management. The importance of attaining a healthy weight is also discussed.   Obesity (BMI 30-39.9) Unchanged. Patient re-educated about  the importance of commitment to a  minimum of 150 minutes of exercise per week. The importance of healthy food choices with portion control discussed. Encouraged to start a food diary, count calories and to consider  joining a support group. Sample diet sheets offered. Goals set by the patient for the next several months.     Restless legs syndrome (RLS) Trial of gabapentin at bedtime  Psychosocial stressors Increased, continues to have difficulty with her daughter, dealing with this as best as she is able. Now enrolled in school in [employer] program and working full time also, which is good for her Denies depression, but does admit to anxiety symptoms, however not deemed medically an issue to be treated separately at this point  Insomnia Worsened and uncontrolled, pt currently taking benadryl and hydroxyzine Sleep hygiene reviewed add gabapentin and also flexeril   IGT (impaired glucose tolerance) Deteriorated Patient educated about the importance of limiting  Carbohydrate intake , the need to commit to daily physical  activity for a minimum of 30 minutes , and to commit weight loss. The fact that changes in all these areas will reduce or eliminate all together the development of diabetes is stressed.   Updated lab needed at/ before next visit.   Dyslipidemia Hyperlipidemia:Low fat diet discussed and encouraged.  Updated lab needed at/ before next  visit.   Metabolic syndrome X The increased risk of cardiovascular disease associated with this diagnosis, and the need to consistently work on lifestyle to change this is discussed. Following  a  heart healthy diet ,commitment to 30 minutes of exercise at least 5 days per week, as well as control of blood sugar and cholesterol , and achieving a healthy weight are all the areas to be addressed .   Unspecified vitamin D deficiency Updated lab needed at/ before next visit.

## 2014-04-05 NOTE — Assessment & Plan Note (Signed)
Worsened and uncontrolled, pt currently taking benadryl and hydroxyzine Sleep hygiene reviewed add gabapentin and also flexeril

## 2014-04-05 NOTE — Assessment & Plan Note (Signed)
Updated lab needed at/ before next visit.   

## 2014-04-05 NOTE — Assessment & Plan Note (Signed)
The increased risk of cardiovascular disease associated with this diagnosis, and the need to consistently work on lifestyle to change this is discussed. Following  a  heart healthy diet ,commitment to 30 minutes of exercise at least 5 days per week, as well as control of blood sugar and cholesterol , and achieving a healthy weight are all the areas to be addressed .  

## 2014-04-05 NOTE — Assessment & Plan Note (Signed)
Trial of gabapentin at bedtime

## 2014-04-08 ENCOUNTER — Encounter: Payer: Self-pay | Admitting: Family Medicine

## 2014-04-08 ENCOUNTER — Other Ambulatory Visit: Payer: Self-pay

## 2014-04-08 LAB — VITAMIN D 25 HYDROXY (VIT D DEFICIENCY, FRACTURES): VIT D 25 HYDROXY: 20 ng/mL — AB (ref 30–89)

## 2014-04-08 MED ORDER — VITAMIN D (ERGOCALCIFEROL) 1.25 MG (50000 UNIT) PO CAPS: 50000.0000 [IU] | ORAL_CAPSULE | ORAL | Status: DC

## 2014-04-29 ENCOUNTER — Other Ambulatory Visit: Payer: Self-pay | Admitting: Family Medicine

## 2014-04-29 ENCOUNTER — Other Ambulatory Visit: Payer: Self-pay

## 2014-04-29 MED ORDER — HYDROXYZINE HCL 50 MG PO TABS: ORAL_TABLET | ORAL | Status: DC

## 2014-07-24 ENCOUNTER — Ambulatory Visit: Payer: 59 | Admitting: Family Medicine

## 2014-08-29 ENCOUNTER — Encounter: Payer: Self-pay | Admitting: Family Medicine

## 2014-08-29 ENCOUNTER — Ambulatory Visit (INDEPENDENT_AMBULATORY_CARE_PROVIDER_SITE_OTHER): Payer: 59 | Admitting: Family Medicine

## 2014-08-29 VITALS — BP 130/84 | HR 90 | Resp 16 | Ht 67.0 in | Wt 260.0 lb

## 2014-08-29 DIAGNOSIS — E8881 Metabolic syndrome: Secondary | ICD-10-CM

## 2014-08-29 DIAGNOSIS — G4700 Insomnia, unspecified: Secondary | ICD-10-CM

## 2014-08-29 DIAGNOSIS — L299 Pruritus, unspecified: Secondary | ICD-10-CM

## 2014-08-29 DIAGNOSIS — N950 Postmenopausal bleeding: Secondary | ICD-10-CM

## 2014-08-29 DIAGNOSIS — I10 Essential (primary) hypertension: Secondary | ICD-10-CM

## 2014-08-29 DIAGNOSIS — R7302 Impaired glucose tolerance (oral): Secondary | ICD-10-CM

## 2014-08-29 DIAGNOSIS — E785 Hyperlipidemia, unspecified: Secondary | ICD-10-CM

## 2014-08-29 DIAGNOSIS — E559 Vitamin D deficiency, unspecified: Secondary | ICD-10-CM

## 2014-08-29 DIAGNOSIS — Z01419 Encounter for gynecological examination (general) (routine) without abnormal findings: Secondary | ICD-10-CM

## 2014-08-29 DIAGNOSIS — N3000 Acute cystitis without hematuria: Secondary | ICD-10-CM

## 2014-08-29 DIAGNOSIS — E669 Obesity, unspecified: Secondary | ICD-10-CM

## 2014-08-29 LAB — POCT URINALYSIS DIPSTICK
BILIRUBIN UA: NEGATIVE
GLUCOSE UA: NEGATIVE
KETONES UA: NEGATIVE
Nitrite, UA: NEGATIVE
PH UA: 5.5
Protein, UA: NEGATIVE
RBC UA: NEGATIVE
Spec Grav, UA: >=1.03
Urobilinogen, UA: NEGATIVE

## 2014-08-29 MED ORDER — TRAZODONE HCL 50 MG PO TABS: 50.0000 mg | ORAL_TABLET | Freq: Every day | ORAL | Status: DC

## 2014-08-29 NOTE — Progress Notes (Signed)
Subjective:    Patient ID: Kristine Lawson, female    DOB: Apr 28, 1971, 44 y.o.   MRN: [id number]  HPI The PT is here for follow up and re-evaluation of chronic medical conditions, medication management and review of any available recent lab and radiology data.  Preventive health is updated, specifically  Cancer screening and Immunization.   Intends to get bariatric surgery later in the year The PT denies any adverse reactions to current medications since the last visit.  Uncontrolled insomnia, wants to start trazodone one at bedtime which has helped  Single episode of PMB , LMP was in 03/2011, needs pap , pelvic and colon screen refer to gyne   Review of Systems See HPI Denies recent fever or chills. Denies sinus pressure, nasal congestion, ear pain or sore throat. Denies chest congestion, productive cough or wheezing. Denies chest pains, palpitations and leg swelling Denies abdominal pain, nausea, vomiting,diarrhea or constipation.   1 monht h/o frequency and a feeling of incomplete voiding. Denies uncontrolled  joint pain, swelling and limitation in mobility. Denies headaches, seizures, numbness, or tingling. Denies depression, anxiety or insomnia. Denies skin break down or rash.        Objective:   Physical Exam  BP 130/84 mmHg  Pulse 90  Resp 16  Ht 5\' 7"  (1.702 m)  Wt 260 lb (117.935 kg)  BMI 40.71 kg/m2  SpO2 97%  LMP 01/31/2011 Patient alert and oriented and in no cardiopulmonary distress.  HEENT: No facial asymmetry, EOMI,   oropharynx pink and moist.  Neck supple no JVD, no mass.  Chest: Clear to auscultation bilaterally.  CVS: S1, S2 no murmurs, no S3.Regular rate.  ABD: Soft non tender.   Ext: No edema  MS: Adequate ROM spine, shoulders, hips and knees.  Skin: Intact, no ulcerations or rash noted.  Psych: Good eye contact, normal affect. Memory intact not anxious or depressed appearing.  CNS: CN 2-12 intact, power,  normal throughout.no focal  deficits noted.       Assessment & Plan:  HYPERTENSION, BENIGN ESSENTIAL Controlled, no change in medication DASH diet and commitment to daily physical activity for a minimum of 30 minutes discussed and encouraged, as a part of hypertension management. The importance of attaining a healthy weight is also discussed.    IGT (impaired glucose tolerance) Patient educated about the importance of limiting  Carbohydrate intake , the need to commit to daily physical activity for a minimum of 30 minutes , and to commit weight loss. The fact that changes in all these areas will reduce or eliminate all together the development of diabetes is stressed.   Updated lab needed at/ before next visit.    Pruritus of scalp Uses hydroxyzine at bedtime   Dyslipidemia Hyperlipidemia:Low fat diet discussed and encouraged.  Updated lab needed at/ before next visit.    Insomnia Sleep hygiene reviewed and written information offered also. Prescription sent for  medication needed. Start trazodone   Obesity (BMI 30-39.9) Deteriorated. Patient re-educated about  the importance of commitment to a  minimum of 150 minutes of exercise per week. The importance of healthy food choices with portion control discussed. Encouraged to start a food diary, count calories and to consider  joining a support group. Sample diet sheets offered. Goals set by the patient for the next several months.      Metabolic syndrome X The increased risk of cardiovascular disease associated with this diagnosis, and the need to consistently work on lifestyle to change this is  discussed. Following  a  heart healthy diet ,commitment to 30 minutes of exercise at least 5 days per week, as well as control of blood sugar and cholesterol , and achieving a healthy weight are all the areas to be addressed .    PMB (postmenopausal bleeding) Single episode of brown d/c since LMP in 2012. Refer to gyne for eval and pelvic exam and  pap which is past due   Acute cystitis 1 month h/o frequency with feeling of incomplete emptying UA mildly abn, will likely need urology eval, starting with gyne eval however Specimen sent for c/s and will f/u on this

## 2014-08-29 NOTE — Patient Instructions (Addendum)
F/u in 4 month, call if you need me before   Fasting lipid, cmp and EGFR, HBA1C, TSH and Vit D  In 4 month  New medication  For sleep   [facility] are [facility] to [facility] as discussed  Urine is being tested further for infection, not entirely normal  It is important that you exercise regularly at least 30 minutes 5 times a week. If you develop chest pain, have severe difficulty breathing, or feel very tired, stop exercising immediately and seek medical attention

## 2014-08-30 ENCOUNTER — Encounter: Payer: Self-pay | Admitting: Family Medicine

## 2014-08-30 DIAGNOSIS — N950 Postmenopausal bleeding: Secondary | ICD-10-CM | POA: Insufficient documentation

## 2014-08-30 DIAGNOSIS — N3000 Acute cystitis without hematuria: Secondary | ICD-10-CM | POA: Insufficient documentation

## 2014-08-30 NOTE — Assessment & Plan Note (Signed)
Uses hydroxyzine at bedtime

## 2014-08-30 NOTE — Assessment & Plan Note (Signed)
Sleep hygiene reviewed and written information offered also. Prescription sent for  medication needed. Start trazodone 

## 2014-08-30 NOTE — Assessment & Plan Note (Signed)
Patient educated about the importance of limiting  Carbohydrate intake , the need to commit to daily physical activity for a minimum of 30 minutes , and to commit weight loss. The fact that changes in all these areas will reduce or eliminate all together the development of diabetes is stressed.   Updated lab needed at/ before next visit.  

## 2014-08-30 NOTE — Assessment & Plan Note (Signed)
Deteriorated. Patient re-educated about  the importance of commitment to a  minimum of 150 minutes of exercise per week. The importance of healthy food choices with portion control discussed. Encouraged to start a food diary, count calories and to consider  joining a support group. Sample diet sheets offered. Goals set by the patient for the next several months.    

## 2014-08-30 NOTE — Assessment & Plan Note (Signed)
Hyperlipidemia:Low fat diet discussed and encouraged.  Updated lab needed at/ before next visit.  

## 2014-08-30 NOTE — Assessment & Plan Note (Signed)
Single episode of brown d/c since LMP in 2012. Refer to gyne for eval and pelvic exam and pap which is past due

## 2014-08-30 NOTE — Assessment & Plan Note (Signed)
1 month h/o frequency with feeling of incomplete emptying UA mildly abn, will likely need urology eval, starting with gyne eval however Specimen sent for c/s and will f/u on this

## 2014-08-30 NOTE — Assessment & Plan Note (Signed)
The increased risk of cardiovascular disease associated with this diagnosis, and the need to consistently work on lifestyle to change this is discussed. Following  a  heart healthy diet ,commitment to 30 minutes of exercise at least 5 days per week, as well as control of blood sugar and cholesterol , and achieving a healthy weight are all the areas to be addressed .  

## 2014-08-30 NOTE — Assessment & Plan Note (Signed)
Controlled, no change in medication DASH diet and commitment to daily physical activity for a minimum of 30 minutes discussed and encouraged, as a part of hypertension management. The importance of attaining a healthy weight is also discussed.  

## 2014-09-03 ENCOUNTER — Encounter: Payer: Self-pay | Admitting: Family Medicine

## 2014-09-03 LAB — URINE CULTURE: Colony Count: 2000

## 2014-09-09 ENCOUNTER — Other Ambulatory Visit: Payer: Self-pay | Admitting: Family Medicine

## 2014-09-15 LAB — HM PAP SMEAR: HM Pap smear: NORMAL

## 2014-09-24 ENCOUNTER — Encounter: Payer: Self-pay | Admitting: Family Medicine

## 2014-09-30 ENCOUNTER — Other Ambulatory Visit: Payer: Self-pay | Admitting: Family Medicine

## 2014-09-30 ENCOUNTER — Telehealth: Payer: Self-pay | Admitting: Family Medicine

## 2014-09-30 DIAGNOSIS — E049 Nontoxic goiter, unspecified: Secondary | ICD-10-CM

## 2014-09-30 NOTE — Telephone Encounter (Signed)
Noted  

## 2014-09-30 NOTE — Telephone Encounter (Signed)
Kristine Lawson patient called back and stated that the letter has been updated Thank you please disregard message

## 2014-10-03 ENCOUNTER — Other Ambulatory Visit (HOSPITAL_COMMUNITY): Payer: 59

## 2014-10-08 ENCOUNTER — Encounter: Payer: Self-pay | Admitting: Family Medicine

## 2014-10-08 ENCOUNTER — Ambulatory Visit (HOSPITAL_COMMUNITY)
Admission: RE | Admit: 2014-10-08 | Discharge: 2014-10-08 | Disposition: A | Discharge status: Home / Self Care | Payer: 59 | Source: Ambulatory Visit | Attending: Family Medicine | Admitting: Family Medicine

## 2014-10-08 ENCOUNTER — Telehealth: Payer: Self-pay | Admitting: Family Medicine

## 2014-10-08 ENCOUNTER — Telehealth: Payer: Self-pay

## 2014-10-08 DIAGNOSIS — E049 Nontoxic goiter, unspecified: Secondary | ICD-10-CM | POA: Diagnosis present

## 2014-10-08 DIAGNOSIS — E042 Nontoxic multinodular goiter: Secondary | ICD-10-CM | POA: Insufficient documentation

## 2014-10-08 NOTE — Telephone Encounter (Signed)
Labs ordered.

## 2014-10-08 NOTE — Telephone Encounter (Signed)
Spoke with patient and she is aware of results.

## 2014-10-08 NOTE — Addendum Note (Signed)
Addended by: Denman George B on: 10/08/2014 04:33 PM   Modules accepted: Orders

## 2014-10-08 NOTE — Telephone Encounter (Signed)
pls add free T3 and free T4 dx is goiter

## 2014-10-09 LAB — T4, FREE: FREE T4: 1.26 ng/dL (ref 0.80–1.80)

## 2014-10-09 LAB — T3, FREE: T3, Free: 3.5 pg/mL (ref 2.3–4.2)

## 2014-10-10 LAB — HEMOGLOBIN A1C
HEMOGLOBIN A1C: 6 % — AB (ref <5.7)
MEAN PLASMA GLUCOSE: 126 mg/dL — AB (ref <117)

## 2014-10-10 LAB — COMPLETE METABOLIC PANEL WITH GFR
ALBUMIN: 3.8 g/dL (ref 3.5–5.2)
ALT: 23 U/L (ref 0–35)
AST: 19 U/L (ref 0–37)
Alkaline Phosphatase: 50 U/L (ref 39–117)
BUN: 15 mg/dL (ref 6–23)
CALCIUM: 9.1 mg/dL (ref 8.4–10.5)
CO2: 30 mEq/L (ref 19–32)
Chloride: 102 mEq/L (ref 96–112)
Creat: 0.74 mg/dL (ref 0.50–1.10)
GFR, Est Non African American: >89 mL/min
GLUCOSE: 82 mg/dL (ref 70–99)
POTASSIUM: 4.3 meq/L (ref 3.5–5.3)
SODIUM: 140 meq/L (ref 135–145)
Total Bilirubin: 0.5 mg/dL (ref 0.2–1.2)
Total Protein: 7.4 g/dL (ref 6.0–8.3)

## 2014-10-10 LAB — LIPID PANEL
CHOLESTEROL: 177 mg/dL (ref 0–200)
HDL: 42 mg/dL — ABNORMAL LOW (ref >=46)
LDL Cholesterol: 124 mg/dL — ABNORMAL HIGH (ref 0–99)
Total CHOL/HDL Ratio: 4.2 Ratio
Triglycerides: 53 mg/dL (ref <150)
VLDL: 11 mg/dL (ref 0–40)

## 2014-10-10 LAB — VITAMIN D 25 HYDROXY (VIT D DEFICIENCY, FRACTURES): Vit D, 25-Hydroxy: 6 ng/mL — ABNORMAL LOW (ref 30–100)

## 2014-10-10 LAB — TSH: TSH: 0.771 u[IU]/mL (ref 0.350–4.500)

## 2014-10-12 ENCOUNTER — Encounter: Payer: Self-pay | Admitting: Family Medicine

## 2014-10-13 ENCOUNTER — Other Ambulatory Visit: Payer: Self-pay

## 2014-10-13 MED ORDER — VITAMIN D (ERGOCALCIFEROL) 1.25 MG (50000 UNIT) PO CAPS: 50000.0000 [IU] | ORAL_CAPSULE | ORAL | Status: DC

## 2014-10-21 ENCOUNTER — Telehealth: Payer: Self-pay | Admitting: Family Medicine

## 2014-10-21 NOTE — Telephone Encounter (Signed)
error 

## 2014-10-23 ENCOUNTER — Encounter: Payer: Self-pay | Admitting: Family Medicine

## 2014-10-30 ENCOUNTER — Other Ambulatory Visit: Payer: Self-pay | Admitting: Family Medicine

## 2014-10-30 ENCOUNTER — Other Ambulatory Visit: Payer: Self-pay | Admitting: General Surgery

## 2014-11-06 ENCOUNTER — Ambulatory Visit (INDEPENDENT_AMBULATORY_CARE_PROVIDER_SITE_OTHER): Payer: 59 | Admitting: Otolaryngology

## 2014-11-06 DIAGNOSIS — D440 Neoplasm of uncertain behavior of thyroid gland: Secondary | ICD-10-CM

## 2014-11-06 DIAGNOSIS — R1312 Dysphagia, oropharyngeal phase: Secondary | ICD-10-CM

## 2014-11-18 ENCOUNTER — Ambulatory Visit (HOSPITAL_COMMUNITY)
Admission: RE | Admit: 2014-11-18 | Discharge: 2014-11-18 | Disposition: A | Discharge status: Home / Self Care | Payer: 59 | Source: Ambulatory Visit | Attending: General Surgery | Admitting: General Surgery

## 2014-11-18 ENCOUNTER — Encounter (HOSPITAL_COMMUNITY)
Admission: RE | Disposition: A | Discharge status: Home / Self Care | Payer: Self-pay | Source: Ambulatory Visit | Attending: General Surgery

## 2014-11-18 ENCOUNTER — Other Ambulatory Visit: Payer: Self-pay

## 2014-11-18 HISTORY — PX: BREATH TEK H PYLORI: SHX5422

## 2014-11-18 SURGERY — BREATH TEST, FOR HELICOBACTER PYLORI

## 2014-11-18 NOTE — Progress Notes (Signed)
   11/18/14 [place]  Referring MD Dr Greer Pickerel  Time of Last PO Intake 2130  Baseline Breath At: 9311  Pranactin Given At: 0740  Post-Dose Breath At: 0755  Sample 1 2.1%  Sample 2 1.8%  Test Postive

## 2014-11-19 ENCOUNTER — Encounter (HOSPITAL_COMMUNITY): Payer: Self-pay | Admitting: General Surgery

## 2014-11-24 ENCOUNTER — Encounter: Payer: Self-pay | Admitting: Family Medicine

## 2014-12-02 ENCOUNTER — Encounter: Payer: 59 | Attending: General Surgery | Admitting: Dietician

## 2014-12-02 ENCOUNTER — Encounter: Payer: Self-pay | Admitting: Dietician

## 2014-12-02 VITALS — Ht 68.0 in | Wt 264.4 lb

## 2014-12-02 DIAGNOSIS — E669 Obesity, unspecified: Secondary | ICD-10-CM | POA: Diagnosis not present

## 2014-12-02 DIAGNOSIS — Z713 Dietary counseling and surveillance: Secondary | ICD-10-CM | POA: Diagnosis not present

## 2014-12-02 DIAGNOSIS — Z6841 Body Mass Index (BMI) 40.0 and over, adult: Secondary | ICD-10-CM | POA: Diagnosis not present

## 2014-12-02 NOTE — Patient Instructions (Signed)

## 2014-12-02 NOTE — Progress Notes (Signed)
  Pre-Op Assessment Visit:  Pre-Operative RYGB Surgery  Medical Nutrition Therapy:  Appt start time: 1400   End time:  1430.  Patient was seen on 12/02/2014 for Pre-Operative Nutrition Assessment. Assessment and letter of approval faxed to [facility] Surgery Bariatric Surgery Program coordinator on 12/02/2014.   Preferred Learning Style:   No preference indicated   Learning Readiness:   Ready  Handouts given during visit include:  Pre-Op Goals Bariatric Surgery Protein Shakes   During the appointment today the following Pre-Op Goals were reviewed with the patient: Maintain or lose weight as instructed by your surgeon Make healthy food choices Begin to limit portion sizes Limited concentrated sugars and fried foods Keep fat/sugar in the single digits per serving on   food labels Practice CHEWING your food  (aim for 30 chews per bite or until applesauce consistency) Practice not drinking 15 minutes before, during, and 30 minutes after each meal/snack Avoid all carbonated beverages  Avoid/limit caffeinated beverages  Avoid all sugar-sweetened beverages Consume 3 meals per day; eat every 3-5 hours Make a list of non-food related activities Aim for 64-100 ounces of FLUID daily  Aim for at least 60-80 grams of PROTEIN daily Look for a liquid protein source that contain ?15 g protein and ?5 g carbohydrate  (ex: shakes, drinks, shots)  Patient-Centered Goals: Kristine Lawson would like to avoid getting diabetes, come off of her blood pressure medication, and have more energy to exercise. 10 level of confidence/level of importance  Demonstrated degree of understanding via:  Teach Back  Teaching Method Utilized:  Visual Auditory Hands on  Barriers to learning/adherence to lifestyle change: none  Patient to call the [facility] to enroll in Pre-Op and Post-Op Nutrition Education when surgery date is scheduled.

## 2014-12-27 ENCOUNTER — Ambulatory Visit: Payer: 59 | Admitting: Dietician

## 2014-12-30 ENCOUNTER — Encounter (HOSPITAL_COMMUNITY): Admission: RE | Payer: Self-pay | Source: Ambulatory Visit

## 2014-12-30 ENCOUNTER — Ambulatory Visit (HOSPITAL_COMMUNITY): Admission: RE | Admit: 2014-12-30 | Payer: 59 | Source: Ambulatory Visit | Admitting: General Surgery

## 2014-12-30 SURGERY — BREATH TEST, FOR HELICOBACTER PYLORI

## 2015-01-01 ENCOUNTER — Ambulatory Visit: Payer: Self-pay | Admitting: General Surgery

## 2015-01-05 ENCOUNTER — Other Ambulatory Visit: Payer: Self-pay | Admitting: Family Medicine

## 2015-01-06 ENCOUNTER — Telehealth: Payer: Self-pay | Admitting: Family

## 2015-01-06 DIAGNOSIS — N390 Urinary tract infection, site not specified: Secondary | ICD-10-CM

## 2015-01-06 MED ORDER — SULFAMETHOXAZOLE-TRIMETHOPRIM 800-160 MG PO TABS: 1.0000 | ORAL_TABLET | Freq: Two times a day (BID) | ORAL | Status: DC

## 2015-01-06 NOTE — Progress Notes (Signed)

## 2015-01-12 ENCOUNTER — Encounter: Payer: 59 | Attending: General Surgery

## 2015-01-12 VITALS — Ht 68.0 in | Wt 268.0 lb

## 2015-01-12 DIAGNOSIS — E669 Obesity, unspecified: Secondary | ICD-10-CM | POA: Diagnosis not present

## 2015-01-12 DIAGNOSIS — Z6841 Body Mass Index (BMI) 40.0 and over, adult: Secondary | ICD-10-CM | POA: Diagnosis not present

## 2015-01-12 DIAGNOSIS — Z713 Dietary counseling and surveillance: Secondary | ICD-10-CM | POA: Insufficient documentation

## 2015-01-12 NOTE — Progress Notes (Signed)
  Pre-Operative Nutrition Class:  Appt start time: 830   End time:  930.  Patient was seen on 01/12/2015 for Pre-Operative Bariatric Surgery Education at the [facility].   Surgery date: 02/10/2015 Surgery type: RYGB Start weight at [facility]: 264.5 lbs on 12/02/14 Weight today: 268 lbs  TANITA  BODY COMP RESULTS  01/12/15   BMI (kg/m^2) 40.7   Fat Mass (lbs) 139.5   Fat Free Mass (lbs) 128.5   Total Body Water (lbs) 94   Samples given per MNT protocol. Patient educated on appropriate usage: Premier protein shake (strawberry - qty 1) Lot #: [id number] Exp: 02/2015  Unjury protein powder (unflavored - qty 1) Lot #: [id number] Exp: 01/2016  PB2 (chocolate - qty 1) Lot #: [id number] Exp: 05/2015  Celebrate Vitamins Calcium Citrate (chocolate - qty 1) Lot #: [id number] Exp: 09/2016  Celebrate vitamins multivitamin chew (orange - qty 1) Lot #: [id number] Exp: 09/2016  The following the learning objectives were met by the patient during this course:  Identify Pre-Op Dietary Goals and will begin 2 weeks pre-operatively  Identify appropriate sources of fluids and proteins   State protein recommendations and appropriate sources pre and post-operatively  Identify Post-Operative Dietary Goals and will follow for 2 weeks post-operatively  Identify appropriate multivitamin and calcium sources  Describe the need for physical activity post-operatively and will follow MD recommendations  State when to call healthcare provider regarding medication questions or post-operative complications  Handouts given during class include:  Pre-Op Bariatric Surgery Diet Handout  Protein Shake Handout  Post-Op Bariatric Surgery Nutrition Handout  BELT Program Information Flyer  Support Group Information Flyer  [facility] Bariatric Supplements Price List  Follow-Up Plan: Patient will follow-up at [facility] 2 weeks post operatively for diet advancement per MD.

## 2015-01-30 ENCOUNTER — Ambulatory Visit: Payer: 59 | Admitting: Family Medicine

## 2015-02-04 NOTE — Patient Instructions (Addendum)
20 Kristine Lawson  02/04/2015   Your procedure is scheduled on:   02-10-2015 Tuesday  Enter through [facility]  Entrance and follow signs to [facility] Arrive at   0515     AM .  (Limit 1 person with you).  Call this number if you have problems the morning of surgery: [phone]  Or Presurgical Testing [phone].   For Living Will and/or Health Care Power Attorney Forms: please provide copy for your medical record,may bring AM of surgery(Forms should be already notarized -we do not provide this service).(02-05-15 Yes/ No information preferred today).  Remember: Follow any bowel prep instructions per MD office.    Do not eat food/ or drink: After Midnight.      Take these medicines the morning of surgery with A SIP OF WATER: Ranitidine. Nasal spray as needed/bring.   Do not wear jewelry, make-up or nail polish.  Do not wear deodorant, lotions, powders, or perfumes.   Do not shave legs and under arms- 48 hours(2 days) prior to first CHG shower.(Shaving face and neck okay.)  Do not bring valuables to the hospital.(Hospital is not responsible for lost valuables).  Contacts, dentures or removable bridgework, body piercing, hair pins may not be worn into surgery.  Leave suitcase in the car. After surgery it may be brought to your room.  For patients admitted to the hospital, checkout time is 11:00 AM the day of discharge.(Restricted visitors-Any Persons displaying flu-like symptoms or illness).    Patients discharged the day of surgery will not be allowed to drive home. Must have responsible person with you x 24 hours once discharged.  Name and phone number of your driver: Kristine Lawson [phone] cell     Please read over the following fact sheets that you were given:  CHG(Chlorhexidine Gluconate 4% Surgical Soap) use.           [facility] - Preparing for Surgery Before surgery, you can play an important role.  Because skin is not sterile, your skin needs to be  as free of germs as possible.  You can reduce the number of germs on your skin by washing with CHG (chlorahexidine gluconate) soap before surgery.  CHG is an antiseptic cleaner which kills germs and bonds with the skin to continue killing germs even after washing. Please DO NOT use if you have an allergy to CHG or antibacterial soaps.  If your skin becomes reddened/irritated stop using the CHG and inform your nurse when you arrive at Short Stay. Do not shave (including legs and underarms) for at least 48 hours prior to the first CHG shower.  You may shave your face/neck. Please follow these instructions carefully:  1.  Shower with CHG Soap the night before surgery and the  morning of Surgery.  2.  If you choose to wash your hair, wash your hair first as usual with your  normal  shampoo.  3.  After you shampoo, rinse your hair and body thoroughly to remove the  shampoo.                           4.  Use CHG as you would any other liquid soap.  You can apply chg directly  to the skin and wash                       Gently with a scrungie or clean washcloth.  5.  Apply  the CHG Soap to your body ONLY FROM THE NECK DOWN.   Do not use on face/ open                           Wound or open sores. Avoid contact with eyes, ears mouth and genitals (private parts).                       Wash face,  Genitals (private parts) with your normal soap.             6.  Wash thoroughly, paying special attention to the area where your surgery  will be performed.  7.  Thoroughly rinse your body with warm water from the neck down.  8.  DO NOT shower/wash with your normal soap after using and rinsing off  the CHG Soap.                9.  Pat yourself dry with a clean towel.            10.  Wear clean pajamas.            11.  Place clean sheets on your bed the night of your first shower and do not  sleep with pets. Day of Surgery : Do not apply any lotions/deodorants the morning of surgery.  Please wear clean clothes to the  hospital/surgery center.  FAILURE TO FOLLOW THESE INSTRUCTIONS MAY RESULT IN THE CANCELLATION OF YOUR SURGERY PATIENT SIGNATURE_________________________________  NURSE SIGNATURE__________________________________  ________________________________________________________________________

## 2015-02-05 ENCOUNTER — Encounter (HOSPITAL_COMMUNITY): Payer: Self-pay

## 2015-02-05 ENCOUNTER — Encounter (HOSPITAL_COMMUNITY)
Admission: RE | Admit: 2015-02-05 | Discharge: 2015-02-05 | Disposition: A | Discharge status: Home / Self Care | Payer: 59 | Source: Ambulatory Visit | Attending: General Surgery | Admitting: General Surgery

## 2015-02-05 DIAGNOSIS — Z01812 Encounter for preprocedural laboratory examination: Secondary | ICD-10-CM | POA: Insufficient documentation

## 2015-02-05 DIAGNOSIS — Z6841 Body Mass Index (BMI) 40.0 and over, adult: Secondary | ICD-10-CM | POA: Insufficient documentation

## 2015-02-05 HISTORY — DX: Insomnia, unspecified: G47.00

## 2015-02-05 LAB — COMPREHENSIVE METABOLIC PANEL
ALT: 25 U/L (ref 14–54)
AST: 26 U/L (ref 15–41)
Albumin: 3.9 g/dL (ref 3.5–5.0)
Alkaline Phosphatase: 42 U/L (ref 38–126)
Anion gap: 3 — ABNORMAL LOW (ref 5–15)
BUN: 16 mg/dL (ref 6–20)
CALCIUM: 9 mg/dL (ref 8.9–10.3)
CHLORIDE: 107 mmol/L (ref 101–111)
CO2: 30 mmol/L (ref 22–32)
CREATININE: 0.85 mg/dL (ref 0.44–1.00)
GFR calc Af Amer: >60 mL/min (ref >60)
Glucose, Bld: 90 mg/dL (ref 65–99)
Potassium: 4.3 mmol/L (ref 3.5–5.1)
SODIUM: 140 mmol/L (ref 135–145)
Total Bilirubin: 0.6 mg/dL (ref 0.3–1.2)
Total Protein: 7.8 g/dL (ref 6.5–8.1)

## 2015-02-05 LAB — HCG, SERUM, QUALITATIVE: PREG SERUM: NEGATIVE

## 2015-02-05 LAB — CBC WITH DIFFERENTIAL/PLATELET
Basophils Absolute: 0 10*3/uL (ref 0.0–0.1)
Basophils Relative: 1 % (ref 0–1)
Eosinophils Absolute: 0.2 10*3/uL (ref 0.0–0.7)
Eosinophils Relative: 4 % (ref 0–5)
HCT: 41.3 % (ref 36.0–46.0)
Hemoglobin: 13.5 g/dL (ref 12.0–15.0)
LYMPHS ABS: 2.8 10*3/uL (ref 0.7–4.0)
Lymphocytes Relative: 45 % (ref 12–46)
MCH: 27.7 pg (ref 26.0–34.0)
MCHC: 32.7 g/dL (ref 30.0–36.0)
MCV: 84.6 fL (ref 78.0–100.0)
Monocytes Absolute: 0.5 10*3/uL (ref 0.1–1.0)
Monocytes Relative: 9 % (ref 3–12)
Neutro Abs: 2.5 10*3/uL (ref 1.7–7.7)
Neutrophils Relative %: 41 % — ABNORMAL LOW (ref 43–77)
Platelets: 331 10*3/uL (ref 150–400)
RBC: 4.88 MIL/uL (ref 3.87–5.11)
RDW: 13.2 % (ref 11.5–15.5)
WBC: 6 10*3/uL (ref 4.0–10.5)

## 2015-02-05 NOTE — Pre-Procedure Instructions (Signed)
EKG/ CXR 4'16 Epic.

## 2015-02-10 ENCOUNTER — Encounter (HOSPITAL_COMMUNITY): Payer: Self-pay

## 2015-02-10 ENCOUNTER — Encounter (HOSPITAL_COMMUNITY)
Admission: AD | Disposition: A | Discharge status: Home / Self Care | Payer: Self-pay | Source: Ambulatory Visit | Attending: General Surgery

## 2015-02-10 ENCOUNTER — Ambulatory Visit (HOSPITAL_COMMUNITY): Payer: 59 | Admitting: Registered Nurse

## 2015-02-10 ENCOUNTER — Inpatient Hospital Stay (HOSPITAL_COMMUNITY)
Admission: AD | Admit: 2015-02-10 | Discharge: 2015-02-12 | DRG: 621 | Disposition: A | Discharge status: Home / Self Care | Payer: 59 | Source: Ambulatory Visit | Attending: General Surgery | Admitting: General Surgery

## 2015-02-10 DIAGNOSIS — I10 Essential (primary) hypertension: Secondary | ICD-10-CM | POA: Diagnosis present

## 2015-02-10 DIAGNOSIS — Z6839 Body mass index (BMI) 39.0-39.9, adult: Secondary | ICD-10-CM | POA: Diagnosis not present

## 2015-02-10 DIAGNOSIS — Z833 Family history of diabetes mellitus: Secondary | ICD-10-CM

## 2015-02-10 DIAGNOSIS — L299 Pruritus, unspecified: Secondary | ICD-10-CM | POA: Diagnosis not present

## 2015-02-10 DIAGNOSIS — E781 Pure hyperglyceridemia: Secondary | ICD-10-CM | POA: Diagnosis present

## 2015-02-10 DIAGNOSIS — R0902 Hypoxemia: Secondary | ICD-10-CM

## 2015-02-10 DIAGNOSIS — M170 Bilateral primary osteoarthritis of knee: Secondary | ICD-10-CM | POA: Diagnosis present

## 2015-02-10 DIAGNOSIS — Z01812 Encounter for preprocedural laboratory examination: Secondary | ICD-10-CM

## 2015-02-10 DIAGNOSIS — T402X5A Adverse effect of other opioids, initial encounter: Secondary | ICD-10-CM | POA: Diagnosis not present

## 2015-02-10 DIAGNOSIS — E559 Vitamin D deficiency, unspecified: Secondary | ICD-10-CM | POA: Diagnosis present

## 2015-02-10 DIAGNOSIS — R7309 Other abnormal glucose: Secondary | ICD-10-CM | POA: Diagnosis present

## 2015-02-10 DIAGNOSIS — R110 Nausea: Secondary | ICD-10-CM

## 2015-02-10 DIAGNOSIS — Z8249 Family history of ischemic heart disease and other diseases of the circulatory system: Secondary | ICD-10-CM

## 2015-02-10 DIAGNOSIS — Z87891 Personal history of nicotine dependence: Secondary | ICD-10-CM | POA: Diagnosis not present

## 2015-02-10 DIAGNOSIS — K219 Gastro-esophageal reflux disease without esophagitis: Secondary | ICD-10-CM | POA: Diagnosis present

## 2015-02-10 DIAGNOSIS — Z803 Family history of malignant neoplasm of breast: Secondary | ICD-10-CM

## 2015-02-10 DIAGNOSIS — Z823 Family history of stroke: Secondary | ICD-10-CM | POA: Diagnosis not present

## 2015-02-10 DIAGNOSIS — Z9884 Bariatric surgery status: Secondary | ICD-10-CM

## 2015-02-10 DIAGNOSIS — Z809 Family history of malignant neoplasm, unspecified: Secondary | ICD-10-CM | POA: Diagnosis not present

## 2015-02-10 HISTORY — PX: LAPAROSCOPIC ROUX-EN-Y GASTRIC BYPASS WITH HIATAL HERNIA REPAIR: SHX6513

## 2015-02-10 HISTORY — PX: UPPER GI ENDOSCOPY: SHX6162

## 2015-02-10 LAB — HEMOGLOBIN AND HEMATOCRIT, BLOOD
HEMATOCRIT: 40 % (ref 36.0–46.0)
Hemoglobin: 12.8 g/dL (ref 12.0–15.0)

## 2015-02-10 SURGERY — CREATION, GASTRIC BYPASS, LAPAROSCOPIC, USING ROUX-EN-Y GASTROENTEROSTOMY, WITH HIATAL HERNIA REPAIR
Anesthesia: General | Site: Esophagus

## 2015-02-10 MED ORDER — 0.9 % SODIUM CHLORIDE (POUR BTL) OPTIME
TOPICAL | Status: DC | PRN
  Administered 2015-02-10: 1000 mL

## 2015-02-10 MED ORDER — BUPIVACAINE LIPOSOME 1.3 % IJ SUSP
20.0000 mL | Freq: Once | INTRAMUSCULAR | Status: AC
  Administered 2015-02-10: 20 mL
  Filled 2015-02-10: qty 20

## 2015-02-10 MED ORDER — PANTOPRAZOLE SODIUM 40 MG IV SOLR
40.0000 mg | Freq: Every day | INTRAVENOUS | Status: DC
  Administered 2015-02-10 – 2015-02-11 (×2): 40 mg via INTRAVENOUS
  Filled 2015-02-10 (×3): qty 40

## 2015-02-10 MED ORDER — MORPHINE SULFATE 2 MG/ML IJ SOLN
2.0000 mg | INTRAMUSCULAR | Status: DC | PRN
  Administered 2015-02-10 (×2): 4 mg via INTRAVENOUS
  Administered 2015-02-10: 2 mg via INTRAVENOUS
  Administered 2015-02-10 (×4): 4 mg via INTRAVENOUS
  Administered 2015-02-10: 2 mg via INTRAVENOUS
  Administered 2015-02-10: 4 mg via INTRAVENOUS
  Filled 2015-02-10: qty 1
  Filled 2015-02-10: qty 2
  Filled 2015-02-10: qty 1
  Filled 2015-02-10 (×6): qty 2

## 2015-02-10 MED ORDER — SUFENTANIL CITRATE 50 MCG/ML IV SOLN
INTRAVENOUS | Status: DC | PRN
  Administered 2015-02-10: 10 ug via INTRAVENOUS
  Administered 2015-02-10: 5 ug via INTRAVENOUS
  Administered 2015-02-10 (×3): 15 ug via INTRAVENOUS
  Administered 2015-02-10 (×3): 10 ug via INTRAVENOUS

## 2015-02-10 MED ORDER — CHLORHEXIDINE GLUCONATE 4 % EX LIQD: 60.0000 mL | Freq: Once | CUTANEOUS | Status: DC

## 2015-02-10 MED ORDER — ACETAMINOPHEN 10 MG/ML IV SOLN
INTRAVENOUS | Status: AC
  Filled 2015-02-10: qty 100

## 2015-02-10 MED ORDER — UNJURY CHOCOLATE CLASSIC POWDER
2.0000 [oz_av] | Freq: Four times a day (QID) | ORAL | Status: DC
  Administered 2015-02-12: 2 [oz_av] via ORAL

## 2015-02-10 MED ORDER — LIP MEDEX EX OINT
TOPICAL_OINTMENT | CUTANEOUS | Status: AC
  Administered 2015-02-10: 15:00:00
  Filled 2015-02-10: qty 7

## 2015-02-10 MED ORDER — DIPHENHYDRAMINE HCL 50 MG/ML IJ SOLN
12.5000 mg | Freq: Four times a day (QID) | INTRAMUSCULAR | Status: DC | PRN
  Administered 2015-02-10: 12.5 mg via INTRAVENOUS
  Administered 2015-02-11 (×2): 25 mg via INTRAVENOUS
  Administered 2015-02-11 (×3): 12.5 mg via INTRAVENOUS
  Administered 2015-02-12: 25 mg via INTRAVENOUS
  Filled 2015-02-10 (×7): qty 1

## 2015-02-10 MED ORDER — METHOCARBAMOL 1000 MG/10ML IJ SOLN
500.0000 mg | Freq: Three times a day (TID) | INTRAMUSCULAR | Status: DC
  Administered 2015-02-10 – 2015-02-12 (×6): 500 mg via INTRAVENOUS
  Filled 2015-02-10 (×7): qty 5

## 2015-02-10 MED ORDER — LACTATED RINGERS IR SOLN
Status: DC | PRN
  Administered 2015-02-10: 1500 mL

## 2015-02-10 MED ORDER — ACETAMINOPHEN 160 MG/5ML PO SOLN: 650.0000 mg | ORAL | Status: DC | PRN

## 2015-02-10 MED ORDER — LABETALOL HCL 5 MG/ML IV SOLN
INTRAVENOUS | Status: DC | PRN
  Administered 2015-02-10: 5 mg via INTRAVENOUS

## 2015-02-10 MED ORDER — ROCURONIUM BROMIDE 100 MG/10ML IV SOLN
INTRAVENOUS | Status: DC | PRN
  Administered 2015-02-10: 55 mg via INTRAVENOUS
  Administered 2015-02-10: 20 mg via INTRAVENOUS
  Administered 2015-02-10: 10 mg via INTRAVENOUS
  Administered 2015-02-10: 5 mg via INTRAVENOUS
  Administered 2015-02-10: 10 mg via INTRAVENOUS

## 2015-02-10 MED ORDER — CEFOTETAN DISODIUM-DEXTROSE 2-2.08 GM-% IV SOLR
INTRAVENOUS | Status: AC
  Filled 2015-02-10: qty 50

## 2015-02-10 MED ORDER — CHLORHEXIDINE GLUCONATE 0.12 % MT SOLN
15.0000 mL | Freq: Two times a day (BID) | OROMUCOSAL | Status: DC
  Administered 2015-02-10 – 2015-02-12 (×4): 15 mL via OROMUCOSAL
  Filled 2015-02-10 (×5): qty 15

## 2015-02-10 MED ORDER — PROPOFOL 10 MG/ML IV BOLUS
INTRAVENOUS | Status: AC
  Filled 2015-02-10: qty 20

## 2015-02-10 MED ORDER — NEOSTIGMINE METHYLSULFATE 10 MG/10ML IV SOLN
INTRAVENOUS | Status: AC
  Filled 2015-02-10: qty 1

## 2015-02-10 MED ORDER — CEFOTETAN DISODIUM-DEXTROSE 2-2.08 GM-% IV SOLR
2.0000 g | INTRAVENOUS | Status: AC
  Administered 2015-02-10: 2 g via INTRAVENOUS

## 2015-02-10 MED ORDER — HYDROMORPHONE HCL 1 MG/ML IJ SOLN
INTRAMUSCULAR | Status: AC
  Filled 2015-02-10: qty 1

## 2015-02-10 MED ORDER — LACTATED RINGERS IV SOLN
INTRAVENOUS | Status: DC | PRN
  Administered 2015-02-10: 08:00:00 via INTRAVENOUS
  Administered 2015-02-10: 1000 mL
  Administered 2015-02-10 (×2): via INTRAVENOUS

## 2015-02-10 MED ORDER — MIDAZOLAM HCL 5 MG/5ML IJ SOLN
INTRAMUSCULAR | Status: DC | PRN
  Administered 2015-02-10: 2 mg via INTRAVENOUS

## 2015-02-10 MED ORDER — SODIUM CHLORIDE 0.9 % IJ SOLN
INTRAMUSCULAR | Status: DC | PRN
  Administered 2015-02-10: 50 mL

## 2015-02-10 MED ORDER — HYDROMORPHONE HCL 1 MG/ML IJ SOLN
0.2500 mg | INTRAMUSCULAR | Status: DC | PRN
  Administered 2015-02-10: 0.5 mg via INTRAVENOUS

## 2015-02-10 MED ORDER — GLYCOPYRROLATE 0.2 MG/ML IJ SOLN
INTRAMUSCULAR | Status: AC
  Filled 2015-02-10: qty 1

## 2015-02-10 MED ORDER — LIDOCAINE HCL (CARDIAC) 20 MG/ML IV SOLN
INTRAVENOUS | Status: DC | PRN
  Administered 2015-02-10: 100 mg via INTRAVENOUS
  Administered 2015-02-10: 25 mg via INTRATRACHEAL

## 2015-02-10 MED ORDER — SUFENTANIL CITRATE 50 MCG/ML IV SOLN
INTRAVENOUS | Status: AC
  Filled 2015-02-10: qty 1

## 2015-02-10 MED ORDER — ONDANSETRON HCL 4 MG/2ML IJ SOLN
INTRAMUSCULAR | Status: DC | PRN
  Administered 2015-02-10: 4 mg via INTRAVENOUS

## 2015-02-10 MED ORDER — OXYCODONE HCL 5 MG/5ML PO SOLN
5.0000 mg | ORAL | Status: DC | PRN
  Administered 2015-02-11: 10 mg via ORAL
  Administered 2015-02-11: 5 mg via ORAL
  Administered 2015-02-11 – 2015-02-12 (×3): 10 mg via ORAL
  Filled 2015-02-10: qty 5
  Filled 2015-02-10 (×4): qty 10

## 2015-02-10 MED ORDER — HEPARIN SODIUM (PORCINE) 5000 UNIT/ML IJ SOLN
5000.0000 [IU] | INTRAMUSCULAR | Status: AC
  Administered 2015-02-10: 5000 [IU] via SUBCUTANEOUS
  Filled 2015-02-10: qty 1

## 2015-02-10 MED ORDER — DEXAMETHASONE SODIUM PHOSPHATE 10 MG/ML IJ SOLN
INTRAMUSCULAR | Status: DC | PRN
  Administered 2015-02-10: 10 mg via INTRAVENOUS

## 2015-02-10 MED ORDER — ONDANSETRON HCL 4 MG/2ML IJ SOLN: 4.0000 mg | INTRAMUSCULAR | Status: DC | PRN

## 2015-02-10 MED ORDER — MIDAZOLAM HCL 2 MG/2ML IJ SOLN
INTRAMUSCULAR | Status: AC
  Filled 2015-02-10: qty 2

## 2015-02-10 MED ORDER — PROPOFOL 10 MG/ML IV BOLUS
INTRAVENOUS | Status: DC | PRN
  Administered 2015-02-10: 40 mg via INTRAVENOUS
  Administered 2015-02-10: 260 mg via INTRAVENOUS

## 2015-02-10 MED ORDER — LIDOCAINE HCL (CARDIAC) 20 MG/ML IV SOLN
INTRAVENOUS | Status: AC
  Filled 2015-02-10: qty 5

## 2015-02-10 MED ORDER — ONDANSETRON HCL 4 MG/2ML IJ SOLN
INTRAMUSCULAR | Status: AC
  Filled 2015-02-10: qty 2

## 2015-02-10 MED ORDER — BUPIVACAINE-EPINEPHRINE (PF) 0.25% -1:200000 IJ SOLN
INTRAMUSCULAR | Status: AC
  Filled 2015-02-10: qty 30

## 2015-02-10 MED ORDER — DEXAMETHASONE SODIUM PHOSPHATE 10 MG/ML IJ SOLN
INTRAMUSCULAR | Status: AC
  Filled 2015-02-10: qty 1

## 2015-02-10 MED ORDER — ENOXAPARIN SODIUM 30 MG/0.3ML ~~LOC~~ SOLN
30.0000 mg | Freq: Two times a day (BID) | SUBCUTANEOUS | Status: DC
  Administered 2015-02-11 – 2015-02-12 (×3): 30 mg via SUBCUTANEOUS
  Filled 2015-02-10 (×5): qty 0.3

## 2015-02-10 MED ORDER — HYDROMORPHONE HCL 2 MG/ML IJ SOLN
INTRAMUSCULAR | Status: AC
  Filled 2015-02-10: qty 1

## 2015-02-10 MED ORDER — UNJURY VANILLA POWDER: 2.0000 [oz_av] | Freq: Four times a day (QID) | ORAL | Status: DC

## 2015-02-10 MED ORDER — LABETALOL HCL 5 MG/ML IV SOLN
INTRAVENOUS | Status: AC
  Filled 2015-02-10: qty 4

## 2015-02-10 MED ORDER — TISSEEL VH 10 ML EX KIT
PACK | CUTANEOUS | Status: AC
  Filled 2015-02-10: qty 2

## 2015-02-10 MED ORDER — TRIAMCINOLONE ACETONIDE 55 MCG/ACT NA AERO
1.0000 | INHALATION_SPRAY | Freq: Every morning | NASAL | Status: DC
  Filled 2015-02-10: qty 10.8

## 2015-02-10 MED ORDER — ROCURONIUM BROMIDE 100 MG/10ML IV SOLN
INTRAVENOUS | Status: AC
  Filled 2015-02-10: qty 1

## 2015-02-10 MED ORDER — PROMETHAZINE HCL 25 MG/ML IJ SOLN: 6.2500 mg | INTRAMUSCULAR | Status: DC | PRN

## 2015-02-10 MED ORDER — NEOSTIGMINE METHYLSULFATE 10 MG/10ML IV SOLN
INTRAVENOUS | Status: DC | PRN
  Administered 2015-02-10: 5 mg via INTRAVENOUS

## 2015-02-10 MED ORDER — SUCCINYLCHOLINE CHLORIDE 20 MG/ML IJ SOLN
INTRAMUSCULAR | Status: DC | PRN
  Administered 2015-02-10: 120 mg via INTRAVENOUS

## 2015-02-10 MED ORDER — FLUTICASONE PROPIONATE 50 MCG/ACT NA SUSP
1.0000 | Freq: Every day | NASAL | Status: DC
  Filled 2015-02-10: qty 16

## 2015-02-10 MED ORDER — ACETAMINOPHEN 160 MG/5ML PO SOLN: 325.0000 mg | ORAL | Status: DC | PRN

## 2015-02-10 MED ORDER — GLYCOPYRROLATE 0.2 MG/ML IJ SOLN
INTRAMUSCULAR | Status: AC
  Filled 2015-02-10: qty 4

## 2015-02-10 MED ORDER — GLYCOPYRROLATE 0.2 MG/ML IJ SOLN
INTRAMUSCULAR | Status: DC | PRN
  Administered 2015-02-10: 0.2 mg via INTRAVENOUS
  Administered 2015-02-10: .8 mg via INTRAVENOUS

## 2015-02-10 MED ORDER — ACETAMINOPHEN 10 MG/ML IV SOLN
INTRAVENOUS | Status: DC | PRN
Start: 2015-02-10 — End: 2015-02-10
  Administered 2015-02-10: 1000 mg via INTRAVENOUS

## 2015-02-10 MED ORDER — UNJURY CHICKEN SOUP POWDER: 2.0000 [oz_av] | Freq: Four times a day (QID) | ORAL | Status: DC

## 2015-02-10 MED ORDER — KCL IN DEXTROSE-NACL 20-5-0.45 MEQ/L-%-% IV SOLN
INTRAVENOUS | Status: DC
  Administered 2015-02-10: 13:00:00 via INTRAVENOUS
  Administered 2015-02-10 – 2015-02-12 (×4): 125 mL/h via INTRAVENOUS
  Filled 2015-02-10 (×8): qty 1000

## 2015-02-10 MED ORDER — SODIUM CHLORIDE 0.9 % IJ SOLN
INTRAMUSCULAR | Status: AC
  Filled 2015-02-10: qty 50

## 2015-02-10 MED ORDER — PHENYLEPHRINE 40 MCG/ML (10ML) SYRINGE FOR IV PUSH (FOR BLOOD PRESSURE SUPPORT)
PREFILLED_SYRINGE | INTRAVENOUS | Status: AC
  Filled 2015-02-10: qty 10

## 2015-02-10 SURGICAL SUPPLY — 79 items
APPLICATOR COTTON TIP 6IN STRL (MISCELLANEOUS) IMPLANT
APPLIER CLIP ROT 13.4 12 LRG (CLIP)
BENZOIN TINCTURE PRP APPL 2/3 (GAUZE/BANDAGES/DRESSINGS) IMPLANT
BLADE SURG 15 STRL LF DISP TIS (BLADE) IMPLANT
BLADE SURG 15 STRL SS (BLADE)
BLADE SURG SZ11 CARB STEEL (BLADE) ×4 IMPLANT
CABLE HIGH FREQUENCY MONO STRZ (ELECTRODE) ×4 IMPLANT
CLIP APPLIE ROT 13.4 12 LRG (CLIP) IMPLANT
CLIP SUT LAPRA TY ABSORB (SUTURE) ×8 IMPLANT
CLOSURE WOUND 1/2 X4 (GAUZE/BANDAGES/DRESSINGS)
COVER SURGICAL LIGHT HANDLE (MISCELLANEOUS) ×4 IMPLANT
CUTTER FLEX LINEAR 45M (STAPLE) ×4 IMPLANT
DECANTER SPIKE VIAL GLASS SM (MISCELLANEOUS) ×8 IMPLANT
DEVICE SUTURE ENDOST 10MM (ENDOMECHANICALS) ×8 IMPLANT
DISSECTOR BLUNT TIP ENDO 5MM (MISCELLANEOUS) IMPLANT
DRAIN PENROSE 18X1/4 LTX STRL (WOUND CARE) ×4 IMPLANT
DRAPE CAMERA CLOSED 9X96 (DRAPES) ×4 IMPLANT
DRAPE UTILITY XL STRL (DRAPES) ×4 IMPLANT
ELECT REM PT RETURN 9FT ADLT (ELECTROSURGICAL) ×4
ELECTRODE REM PT RTRN 9FT ADLT (ELECTROSURGICAL) ×2 IMPLANT
GAUZE SPONGE 4X4 12PLY STRL (GAUZE/BANDAGES/DRESSINGS) ×4 IMPLANT
GAUZE SPONGE 4X4 16PLY XRAY LF (GAUZE/BANDAGES/DRESSINGS) ×4 IMPLANT
GLOVE BIO SURGEON STRL SZ7.5 (GLOVE) ×4 IMPLANT
GLOVE BIOGEL M STRL SZ7.5 (GLOVE) IMPLANT
GLOVE BIOGEL PI IND STRL 7.0 (GLOVE) ×2 IMPLANT
GLOVE BIOGEL PI INDICATOR 7.0 (GLOVE) ×2
GLOVE INDICATOR 8.0 STRL GRN (GLOVE) ×4 IMPLANT
GOWN STRL REUS W/TWL LRG LVL3 (GOWN DISPOSABLE) ×4 IMPLANT
GOWN STRL REUS W/TWL XL LVL3 (GOWN DISPOSABLE) ×20 IMPLANT
HEMOSTAT SURGICEL 4X8 (HEMOSTASIS) IMPLANT
HOVERMATT SINGLE USE (MISCELLANEOUS) ×4 IMPLANT
KIT BASIN OR (CUSTOM PROCEDURE TRAY) ×4 IMPLANT
KIT GASTRIC LAVAGE 34FR ADT (SET/KITS/TRAYS/PACK) ×4 IMPLANT
LIQUID BAND (GAUZE/BANDAGES/DRESSINGS) IMPLANT
MARKER SKIN DUAL TIP RULER LAB (MISCELLANEOUS) ×4 IMPLANT
NEEDLE SPNL 22GX3.5 QUINCKE BK (NEEDLE) ×4 IMPLANT
NS IRRIG 1000ML POUR BTL (IV SOLUTION) ×4 IMPLANT
PACK CARDIOVASCULAR III (CUSTOM PROCEDURE TRAY) ×4 IMPLANT
POUCH SPECIMEN RETRIEVAL 10MM (ENDOMECHANICALS) IMPLANT
RELOAD 45 VASCULAR/THIN (ENDOMECHANICALS) IMPLANT
RELOAD ENDO STITCH 2.0 (ENDOMECHANICALS) ×20
RELOAD STAPLE TA45 3.5 REG BLU (ENDOMECHANICALS) ×8 IMPLANT
RELOAD STAPLER BLUE 60MM (STAPLE) ×8 IMPLANT
RELOAD STAPLER GOLD 60MM (STAPLE) ×2 IMPLANT
RELOAD STAPLER WHITE 60MM (STAPLE) ×4 IMPLANT
SCISSORS LAP 5X35 DISP (ENDOMECHANICALS) ×4 IMPLANT
SEALANT SURGICAL APPL DUAL CAN (MISCELLANEOUS) ×8 IMPLANT
SET IRRIG TUBING LAPAROSCOPIC (IRRIGATION / IRRIGATOR) ×4 IMPLANT
SHEARS HARMONIC ACE PLUS 36CM (ENDOMECHANICALS) ×4 IMPLANT
SLEEVE ADV FIXATION 12X100MM (TROCAR) ×8 IMPLANT
SLEEVE ADV FIXATION 5X100MM (TROCAR) ×4 IMPLANT
SLEEVE ENDOPATH XCEL 5M (ENDOMECHANICALS) ×4 IMPLANT
SOLUTION ANTI FOG 6CC (MISCELLANEOUS) ×4 IMPLANT
STAPLER ECHELON LONG 60 440 (INSTRUMENTS) ×4 IMPLANT
STAPLER RELOAD BLUE 60MM (STAPLE) ×16
STAPLER RELOAD GOLD 60MM (STAPLE) ×4
STAPLER RELOAD WHITE 60MM (STAPLE) ×8
STAPLER VISISTAT 35W (STAPLE) ×4 IMPLANT
STRIP CLOSURE SKIN 1/2X4 (GAUZE/BANDAGES/DRESSINGS) IMPLANT
SUT ETHILON 3 0 PS 1 (SUTURE) IMPLANT
SUT MNCRL AB 4-0 PS2 18 (SUTURE) ×4 IMPLANT
SUT RELOAD ENDO STITCH 2 48X1 (ENDOMECHANICALS) ×10
SUT RELOAD ENDO STITCH 2.0 (ENDOMECHANICALS) ×10
SUT SILK 2 0 SH (SUTURE) IMPLANT
SUT VIC AB 2-0 SH 27 (SUTURE) ×2
SUT VIC AB 2-0 SH 27X BRD (SUTURE) ×2 IMPLANT
SUTURE RELOAD END STTCH 2 48X1 (ENDOMECHANICALS) ×10 IMPLANT
SUTURE RELOAD ENDO STITCH 2.0 (ENDOMECHANICALS) ×10 IMPLANT
SYR 20CC LL (SYRINGE) ×4 IMPLANT
SYR 50ML LL SCALE MARK (SYRINGE) ×4 IMPLANT
SYR CONTROL 10ML LL (SYRINGE) ×4 IMPLANT
TOWEL OR 17X26 10 PK STRL BLUE (TOWEL DISPOSABLE) ×4 IMPLANT
TRAY FOLEY W/METER SILVER 14FR (SET/KITS/TRAYS/PACK) ×4 IMPLANT
TROCAR BALLN 12MMX100 BLUNT (TROCAR) ×4 IMPLANT
TROCAR BLADELESS OPT 5 100 (ENDOMECHANICALS) ×4 IMPLANT
TROCAR XCEL 12X100 BLDLESS (ENDOMECHANICALS) ×4 IMPLANT
TUBING ENDO SMARTCAP (MISCELLANEOUS) ×4 IMPLANT
TUBING FILTER THERMOFLATOR (ELECTROSURGICAL) ×4 IMPLANT
WATER STERILE IRR 1500ML POUR (IV SOLUTION) ×4 IMPLANT

## 2015-02-10 NOTE — H&P (Signed)
Kristine Lawson is an 44 y.o. female.   Chief Complaint: here for surgery HPI: 44 yo AAF with HTN, prediabetes, hyperlipidemia, OA comes in for scheduled lap roux en y gastric bypass. Denies any changes since office visit. No fever,chills, n/v/cp/sob/doe/d/c/dysuria. ugi - normal. cxr - nml. preop labs ok  Past Medical History  Diagnosis Date  . Hypertension   . Allergy   . Arthritis   . Insomnia     meds needed daily to achieve 6+ hours sleep.    Past Surgical History  Procedure Laterality Date  . Appendectomy    . Tubal ligation    . Cholecystectomy    . Breath tek h pylori N/A 11/18/2014    Procedure: BREATH TEK H PYLORI;  Surgeon: Greer Pickerel, MD;  Location: [facility];  Service: General;  Laterality: N/A;  . Ablation      uterine ablation    Family History  Problem Relation Age of Onset  . Hypertension Mother   . Diabetes Mother   . Kidney disease Mother   . Hyperlipidemia Mother   . Cancer Mother     breast  . Kidney disease Father   . Stroke Father   . Heart disease Father   . Hyperlipidemia Father   . Hypertension Brother    Social History:  reports that she quit smoking about 8 years ago. She does not have any smokeless tobacco history on file. She reports that she does not drink alcohol or use illicit drugs.  Allergies:  Allergies  Allergen Reactions  . Lisinopril Cough  . Aspirin Hives    Goody Powder-    Medications Prior to Admission  Medication Sig Dispense Refill  . cyclobenzaprine (FLEXERIL) 10 MG tablet Take 1 tablet (10 mg total) by mouth at bedtime. 90 tablet 3  . diphenhydrAMINE (BENADRYL) 25 MG tablet Take 75 mg by mouth at bedtime.  90 tablet 5  . hydrOXYzine (ATARAX/VISTARIL) 50 MG tablet TAKE 3 TABLETS BY MOUTH DAILY (Patient taking differently: TAKE 3 TABLETS BY MOUTH AT BEDTIME.) 270 tablet 1  . naproxen (NAPROSYN) 500 MG tablet TAKE 1 TABLET BY MOUTH 2 TIMES DAILY WITH A MEAL. (Patient taking differently: TAKE 1 TABLET BY MOUTH  NIGHTLY.) 180 tablet PRN  . ranitidine (ZANTAC) 300 MG tablet Take 300 mg by mouth 2 (two) times daily.     . traZODone (DESYREL) 50 MG tablet Take 1 tablet (50 mg total) by mouth at bedtime. 90 tablet 1  . triamcinolone (NASACORT ALLERGY 24HR) 55 MCG/ACT AERO nasal inhaler Place 1 spray into the nose every morning.    . triamterene-hydrochlorothiazide (MAXZIDE-25) 37.5-25 MG per tablet TAKE ONE TABLET BY MOUTH ONCE DAILY 90 tablet PRN  . acyclovir (ZOVIRAX) 400 MG tablet Take 1 tablet (400 mg total) by mouth as needed. (Patient not taking: Reported on 12/02/2014) 90 tablet 1  . gabapentin (NEURONTIN) 300 MG capsule Take 1 capsule (300 mg total) by mouth at bedtime. (Patient not taking: Reported on 12/02/2014) 90 capsule 3  . ibuprofen (ADVIL,MOTRIN) 800 MG tablet Take 800 mg by mouth daily as needed for headache.    . sulfamethoxazole-trimethoprim (BACTRIM DS,SEPTRA DS) 800-160 MG per tablet Take 1 tablet by mouth 2 (two) times daily. (Patient not taking: Reported on 01/30/2015) 10 tablet 0  . Vitamin D, Ergocalciferol, (DRISDOL) 50000 UNITS CAPS capsule Take 1 capsule (50,000 Units total) by mouth every 7 (seven) days. (Patient not taking: Reported on 12/02/2014) 12 capsule 3    No results found for this  or any previous visit (from the past 48 hour(s)). No results found.  Review of Systems  Constitutional: Negative for weight loss.  HENT: Negative for nosebleeds.   Eyes: Negative for blurred vision.  Respiratory: Negative for shortness of breath.   Cardiovascular: Negative for chest pain, palpitations, orthopnea and PND.       Denies DOE  Genitourinary: Negative for dysuria and hematuria.  Musculoskeletal: Negative.   Skin: Negative for itching and rash.  Neurological: Negative for dizziness, focal weakness, seizures, loss of consciousness and headaches.       Denies TIAs, amaurosis fugax  Endo/Heme/Allergies: Does not bruise/bleed easily.  Psychiatric/Behavioral: The patient is not  nervous/anxious.     Blood pressure 136/90, pulse 84, temperature 98 F (36.7 C), temperature source Oral, resp. rate 18, height 5\' 8"  (1.727 m), weight 117.708 kg (259 lb 8 oz), last menstrual period 02/10/2011, SpO2 100 %. Physical Exam  Vitals reviewed. Constitutional: She is oriented to person, place, and time. She appears well-developed and well-nourished. No distress.  Morbid obese  HENT:  Head: Normocephalic and atraumatic.  Right Ear: External ear normal.  Left Ear: External ear normal.  Eyes: Conjunctivae are normal. No scleral icterus.  Neck: Normal range of motion. Neck supple. No tracheal deviation present. No thyromegaly present.  Cardiovascular: Normal rate and normal heart sounds.   Respiratory: Effort normal and breath sounds normal. No stridor. No respiratory distress. She has no wheezes.  GI: Soft. She exhibits no distension. There is no tenderness. There is no guarding.  Musculoskeletal: She exhibits no edema or tenderness.  Lymphadenopathy:    She has no cervical adenopathy.  Neurological: She is alert and oriented to person, place, and time. She exhibits normal muscle tone.  Skin: Skin is warm and dry. No rash noted. She is not diaphoretic. No erythema. No pallor.  Psychiatric: She has a normal mood and affect. Her behavior is normal. Judgment and thought content normal.     Assessment/Plan Morbid obesity Prediabetes HTN OA Elevated triglycerides Vit d deficiency  To OR for lap roux en y gastric bypass.  All questions asked and answered  Leighton Ruff. Redmond Pulling, MD, FACS General, Bariatric, & Minimally Invasive Surgery [facility], [place]   [facility] M 02/10/2015, 7:12 AM

## 2015-02-10 NOTE — Op Note (Signed)
Kristine Lawson [id number] 1970/07/30. 02/10/2015  Preoperative diagnosis:  1. Morbid obesity (Body mass index is 39.47 kg/(m^2).)  2. Hypertension 3. Prediabetes 4. OA bilateral knees 5. GERD 6. Elevated triglycerides  Postoperative  diagnosis:  1. same  Surgical procedure: Laparoscopic Roux-en-Y gastric bypass (ante-colic, ante-gastric); upper endoscopy  Surgeon: Gayland Curry, M.D. FACS  Asst.: Excell Seltzer, MD FACS  Anesthesia: General plus 41PF  Complications: None   EBL: Minimal   Drains: None   Disposition: PACU in good condition   Indications for procedure: 44yo AAF with morbid obesity who has been unsuccessful at sustained weight loss. The patient's comorbidities are listed above. We discussed the risk and benefits of surgery including but not limited to anesthesia risk, bleeding, infection, blood clot formation, anastomotic leak, anastomotic stricture, ulcer formation, death, respiratory complications, intestinal blockage, internal hernia, gallstone formation, vitamin and nutritional deficiencies, injury to surrounding structures, failure to lose weight and mood changes.   Description of procedure: Patient is brought to the operating room and general anesthesia induced. The patient had received preoperative broad-spectrum IV antibiotics and subcutaneous heparin. The abdomen was widely sterilely prepped with Chloraprep and draped. Patient timeout was performed and correct patient and procedure confirmed. Access was obtained with a 12 mm Optiview trocar in the left upper quadrant and pneumoperitoneum established without difficulty. Under direct vision 12 mm trocars were placed laterally in the right upper quadrant, right upper quadrant midclavicular line, and to the left and above the umbilicus for the camera port. A 5 mm trocar was placed laterally in the left upper quadrant. There were some omental adhesions in the RLQ and mid abdomen which were taken down with harmonic  scalpel.  The omentum was brought into the upper abdomen and the transverse mesocolon elevated and the ligament of Treitz clearly identified. A 40 cm biliopancreatic limb was then carefully measured from the ligament of Treitz. The small intestine was divided at this point with a single firing of the white load linear stapler. A Penrose drain was sutured to the end of the Roux-en-Y limb for later identification. A 100 cm Roux-en-Y limb was then carefully measured. At this point a side-to-side anastomosis was created between the Roux limb and the end of the biliopancreatic limb. This was accomplished with a single firing of the 45 mm white load linear stapler. The common enterotomy was closed with a running 2-0 Vicryl begun at either end of the enterotomy and tied centrally. Tisseel tissue sealant was placed over the anastomosis. The mesenteric defect was then closed with running 2-0 silk. The omentum was then divided with the harmonic scalpel up towards the transverse colon to allow mobility of the Roux limb toward the gastric pouch. The patient was then placed in steep reversed Trendelenburg. Through a 5 mm subxiphoid site the [facility] retractor was placed and the left lobe of the liver elevated with excellent exposure of the upper stomach and hiatus. The angle of Hiss was then mobilized with the harmonic scalpel. A 4 cm gastric pouch was then carefully measured along the lesser curve of the stomach. Dissection was carried along the lesser curve at this point with the Harmonic scalpel working carefully back toward the lesser sac at right angles to the lesser curve. The free lesser sac was then entered. After being sure all tubes were removed from the stomach an initial firing of the gold load 60 mm linear stapler was fired at right angles across the lesser curve for about 4 cm. The gastric pouch was  further mobilized posteriorly and then the pouch was completed with 2 further firings of the 60 mm blue load linear  stapler and 1 final firing of a blue load 57mm linear stapler up through the previously dissected angle of His. The last 2 firings of the stapler had seamguard. It was ensured that the pouch was completely mobilized away from the gastric remnant. This created a nice tubular 4-5 cm gastric pouch. The Roux limb was then brought up in an antecolic fashion with the candycane facing to the patient's left without undue tension. The gastrojejunostomy was created with an initial posterior row of 2-0 Vicryl between the Roux limb and the staple line of the gastric pouch. Enterotomies were then made in the gastric pouch and the Roux limb with the harmonic scalpel and at approximately 2-2-1/2 cm anastomosis was created with a single firing of the 21mm blue load linear stapler. The staple line was inspected and was intact without bleeding. The common enterotomy was then closed with running 2-0 Vicryl begun at either end and tied centrally. The Ewall tube was then easily passed through the anastomosis and an outer anterior layer of running 2-0 Vicryl was placed. The Ewald tube was removed. With the outlet of the gastrojejunostomy clamped and under saline irrigation the assistant performed upper endoscopy and with the gastric pouch tensely distended with air-there was no evidence of leak on this test. The pouch was desufflated. The Terance Hart defect was closed with running 2-0 silk. The abdomen was inspected for any evidence of bleeding or bowel injury and everything looked fine. The Nathanson retractor was removed under direct vision after coating the anastomosis with Tisseel tissue sealant. Exparel was infiltrated at all trocar sites. All CO2 was evacuated and trochars removed. Skin incisions were closed with 4-0 monocryl in a subcuticular fashion followed by Dermabond. Sponge needle and instrument counts were correct. The patient was taken to the PACU in good condition.    Leighton Ruff. Redmond Pulling, MD, FACS General, Bariatric, &  Minimally Invasive Surgery [facility], [place]

## 2015-02-10 NOTE — Anesthesia Procedure Notes (Signed)
Procedure Name: Intubation Date/Time: 02/10/2015 7:27 AM Performed by: Lissa Morales Pre-anesthesia Checklist: Patient identified, Emergency Drugs available, Suction available and Patient being monitored Patient Re-evaluated:Patient Re-evaluated prior to inductionOxygen Delivery Method: Circle System Utilized Preoxygenation: Pre-oxygenation with 100% oxygen Intubation Type: IV induction Ventilation: Mask ventilation without difficulty Laryngoscope Size: Mac and 4 Grade View: Grade II Tube type: Oral Number of attempts: 1 Airway Equipment and Method: Stylet and Oral airway Placement Confirmation: ETT inserted through vocal cords under direct vision,  positive ETCO2 and breath sounds checked- equal and bilateral Secured at: 21 cm Tube secured with: Tape Dental Injury: Teeth and Oropharynx as per pre-operative assessment

## 2015-02-10 NOTE — Transfer of Care (Signed)
Immediate Anesthesia Transfer of Care Note  Patient: Kristine Lawson  Procedure(s) Performed: Procedure(s): LAPAROSCOPIC ROUX-EN-Y GASTRIC BYPASS  (N/A) UPPER GI ENDOSCOPY (N/A)  Patient Location: PACU  Anesthesia Type:General  Level of Consciousness: awake, alert , oriented and patient cooperative  Airway & Oxygen Therapy: Patient Spontanous Breathing and Patient connected to face mask oxygen  Post-op Assessment: Report given to RN, Post -op Vital signs reviewed and stable and Patient moving all extremities X 4  Post vital signs: stable  Last Vitals:  Filed Vitals:   02/10/15 0530  BP: 136/90  Pulse: 84  Temp: 36.7 C  Resp: 18    Complications: No apparent anesthesia complications

## 2015-02-10 NOTE — Progress Notes (Signed)
Utilization review completed.  

## 2015-02-10 NOTE — Anesthesia Preprocedure Evaluation (Addendum)
Anesthesia Evaluation  Patient identified by MRN, date of birth, ID band Patient awake    Reviewed: Allergy & Precautions, NPO status , Patient's Chart, lab work & pertinent test results  Airway Mallampati: I       Dental  (+) Teeth Intact   Pulmonary former smoker,  breath sounds clear to auscultation        Cardiovascular hypertension, Rhythm:Regular Rate:Normal     Neuro/Psych    GI/Hepatic negative GI ROS, Neg liver ROS,   Endo/Other  Morbid obesity  Renal/GU      Musculoskeletal  (+) Arthritis -,   Abdominal (+) + obese,   Peds  Hematology   Anesthesia Other Findings   Reproductive/Obstetrics                            Anesthesia Physical Anesthesia Plan  ASA: II  Anesthesia Plan: General   Post-op Pain Management:    Induction: Intravenous  Airway Management Planned: Oral ETT  Additional Equipment:   Intra-op Plan:   Post-operative Plan: Extubation in OR  Informed Consent: I have reviewed the patients History and Physical, chart, labs and discussed the procedure including the risks, benefits and alternatives for the proposed anesthesia with the patient or authorized representative who has indicated his/her understanding and acceptance.     Plan Discussed with: CRNA and Surgeon  Anesthesia Plan Comments:         Anesthesia Quick Evaluation

## 2015-02-10 NOTE — Anesthesia Postprocedure Evaluation (Signed)
  Anesthesia Post-op Note  Patient: Kristine Lawson  Procedure(s) Performed: Procedure(s): LAPAROSCOPIC ROUX-EN-Y GASTRIC BYPASS  (N/A) UPPER GI ENDOSCOPY (N/A)  Patient Location: PACU  Anesthesia Type:General  Level of Consciousness: awake and alert   Airway and Oxygen Therapy: Patient Spontanous Breathing  Post-op Pain: mild  Post-op Assessment: Post-op Vital signs reviewed              Post-op Vital Signs: stable  Last Vitals:  Filed Vitals:   02/10/15 1115  BP: 130/79  Pulse: 64  Temp: 36.9 C  Resp: 14    Complications: No apparent anesthesia complications

## 2015-02-11 ENCOUNTER — Encounter (HOSPITAL_COMMUNITY): Payer: Self-pay | Admitting: General Surgery

## 2015-02-11 ENCOUNTER — Inpatient Hospital Stay (HOSPITAL_COMMUNITY): Payer: 59

## 2015-02-11 LAB — COMPREHENSIVE METABOLIC PANEL
ALT: 32 U/L (ref 14–54)
ANION GAP: 5 (ref 5–15)
AST: 30 U/L (ref 15–41)
Albumin: 3.4 g/dL — ABNORMAL LOW (ref 3.5–5.0)
Alkaline Phosphatase: 39 U/L (ref 38–126)
BUN: 8 mg/dL (ref 6–20)
CALCIUM: 8.5 mg/dL — AB (ref 8.9–10.3)
CO2: 29 mmol/L (ref 22–32)
CREATININE: 0.78 mg/dL (ref 0.44–1.00)
Chloride: 105 mmol/L (ref 101–111)
GFR calc Af Amer: >60 mL/min (ref >60)
GFR calc non Af Amer: >60 mL/min (ref >60)
GLUCOSE: 109 mg/dL — AB (ref 65–99)
POTASSIUM: 4.7 mmol/L (ref 3.5–5.1)
Sodium: 139 mmol/L (ref 135–145)
Total Bilirubin: 0.5 mg/dL (ref 0.3–1.2)
Total Protein: 7 g/dL (ref 6.5–8.1)

## 2015-02-11 LAB — CBC WITH DIFFERENTIAL/PLATELET
BASOS ABS: 0 10*3/uL (ref 0.0–0.1)
Basophils Relative: 0 % (ref 0–1)
Eosinophils Absolute: 0 10*3/uL (ref 0.0–0.7)
Eosinophils Relative: 0 % (ref 0–5)
HEMATOCRIT: 37.9 % (ref 36.0–46.0)
Hemoglobin: 12.3 g/dL (ref 12.0–15.0)
LYMPHS ABS: 2.2 10*3/uL (ref 0.7–4.0)
LYMPHS PCT: 20 % (ref 12–46)
MCH: 27.9 pg (ref 26.0–34.0)
MCHC: 32.5 g/dL (ref 30.0–36.0)
MCV: 85.9 fL (ref 78.0–100.0)
MONOS PCT: 9 % (ref 3–12)
Monocytes Absolute: 1 10*3/uL (ref 0.1–1.0)
Neutro Abs: 7.8 10*3/uL — ABNORMAL HIGH (ref 1.7–7.7)
Neutrophils Relative %: 71 % (ref 43–77)
Platelets: 319 10*3/uL (ref 150–400)
RBC: 4.41 MIL/uL (ref 3.87–5.11)
RDW: 13.5 % (ref 11.5–15.5)
WBC: 11 10*3/uL — AB (ref 4.0–10.5)

## 2015-02-11 LAB — HEMOGLOBIN AND HEMATOCRIT, BLOOD
HCT: 37.8 % (ref 36.0–46.0)
Hemoglobin: 12.3 g/dL (ref 12.0–15.0)

## 2015-02-11 MED ORDER — HYDROMORPHONE HCL 1 MG/ML IJ SOLN
0.5000 mg | INTRAMUSCULAR | Status: DC | PRN
  Administered 2015-02-11: 1 mg via INTRAVENOUS
  Filled 2015-02-11: qty 1

## 2015-02-11 MED ORDER — IOHEXOL 300 MG/ML  SOLN
50.0000 mL | Freq: Once | INTRAMUSCULAR | Status: AC | PRN
  Administered 2015-02-11: 50 mL via ORAL

## 2015-02-11 MED ORDER — SIMETHICONE 40 MG/0.6ML PO SUSP
40.0000 mg | Freq: Four times a day (QID) | ORAL | Status: DC | PRN
  Administered 2015-02-11: 40 mg via ORAL
  Filled 2015-02-11 (×2): qty 0.6

## 2015-02-11 NOTE — Progress Notes (Signed)
1 Day Post-Op  Subjective: Had lots of itching. No n/v. Ambulated 4x. Doing IS. Some pain  Objective: Vital signs in last 24 hours: Temp:  [97.9 F (36.6 C)-98.9 F (37.2 C)] 98.9 F (37.2 C) (07/20 0640) Pulse Rate:  [64-76] 74 (07/20 0640) Resp:  [9-20] 18 (07/20 0640) BP: (120-146)/(73-89) 130/74 mmHg (07/20 0640) SpO2:  [95 %-100 %] 100 % (07/20 0640) Last BM Date: 02/09/15  Intake/Output from previous day: 07/19 0701 - 07/20 0700 In: 5137.5 [I.V.:5137.5] Out: 2300 [Urine:2250; Blood:50] Intake/Output this shift:    Sitting in chair, nontoxic cta b/l Reg Soft, nt, nd, incisions c/d/i No edema  Lab Results:   Recent Labs  02/10/15 1206 02/11/15 0443  WBC  --  11.0*  HGB 12.8 12.3  HCT 40.0 37.9  PLT  --  319   BMET  Recent Labs  02/11/15 0443  NA 139  K 4.7  CL 105  CO2 29  GLUCOSE 109*  BUN 8  CREATININE 0.78  CALCIUM 8.5*   PT/INR No results for input(s): LABPROT, INR in the last 72 hours. ABG No results for input(s): PHART, HCO3 in the last 72 hours.  Invalid input(s): PCO2, PO2  Studies/Results: No results found.  Anti-infectives: Anti-infectives    Start     Dose/Rate Route Frequency Ordered Stop   02/10/15 0537  cefoTEtan in Dextrose 5% (CEFOTAN) IVPB 2 g     2 g Intravenous On call to O.R. 02/10/15 0537 02/10/15 0723      Assessment/Plan: s/p Procedure(s): LAPAROSCOPIC ROUX-EN-Y GASTRIC BYPASS  (N/A) UPPER GI ENDOSCOPY (N/A) Looks good  Will switch morphine For UGI this am Cont chemical vte prophylaxis Is, pulm toilet  Leighton Ruff. Redmond Pulling, MD, FACS General, Bariatric, & Minimally Invasive Surgery [facility], [place]   LOS: 1 day    Gayland Curry 02/11/2015

## 2015-02-11 NOTE — Plan of Care (Signed)
Problem: Food- and Nutrition-Related Knowledge Deficit (NB-1.1) Goal: Nutrition education Formal process to instruct or train a patient/client in a skill or to impart knowledge to help patients/clients voluntarily manage or modify food choices and eating behavior to maintain or improve health. Outcome: Completed/Met Date Met:  02/11/15 Nutrition Education Note  Received consult for diet education per DROP protocol.   Discussed 2 week post op diet with pt. Emphasized that liquids must be non carbonated, non caffeinated, and sugar free. Fluid goals discussed. Pt to follow up with outpatient bariatric RD for further diet progression after 2 weeks. Multivitamins and minerals also reviewed. Teach back method used, pt expressed understanding, expect good compliance.   Diet: First 2 Weeks  You will see the nutritionist about two (2) weeks after your surgery. The nutritionist will increase the types of foods you can eat if you are handling liquids well:  If you have severe vomiting or nausea and cannot handle clear liquids lasting longer than 1 day, call your surgeon  Protein Shake  Drink at least 2 ounces of shake 5-6 times per day  Each serving of protein shakes (usually 8 - 12 ounces) should have a minimum of:  15 grams of protein  And no more than 5 grams of carbohydrate  Goal for protein each day:  Men = 80 grams per day  Women = 60 grams per day  Protein powder may be added to fluids such as non-fat milk or Lactaid milk or Soy milk (limit to 35 grams added protein powder per serving)   Hydration  Slowly increase the amount of water and other clear liquids as tolerated (See Acceptable Fluids)  Slowly increase the amount of protein shake as tolerated  Sip fluids slowly and throughout the day  May use sugar substitutes in small amounts (no more than 6 - 8 packets per day; i.e. Splenda)   Fluid Goal  The first goal is to drink at least 8 ounces of protein shake/drink per day (or as directed  by the nutritionist); some examples of protein shakes are [employer], [employer], EAS Edge HP, and Unjury. See handout from pre-op Bariatric Education Class:  Slowly increase the amount of protein shake you drink as tolerated  You may find it easier to slowly sip shakes throughout the day  It is important to get your proteins in first  Your fluid goal is to drink 64 - 100 ounces of fluid daily  It may take a few weeks to build up to this  32 oz (or more) should be clear liquids  And  32 oz (or more) should be full liquids (see below for examples)  Liquids should not contain sugar, caffeine, or carbonation   Clear Liquids:  Water or Sugar-free flavored water (i.e. Fruit H2O, Propel)  Decaffeinated coffee or tea (sugar-free)  Crystal Lite, Wyler's Lite, Minute Maid Lite  Sugar-free Jell-O  Bouillon or broth  Sugar-free Popsicle: *Less than 20 calories each; Limit 1 per day   Full Liquids:  Protein Shakes/Drinks + 2 choices per day of other full liquids  Full liquids must be:  No More Than 12 grams of Carbs per serving  No More Than 3 grams of Fat per serving  Strained low-fat cream soup  Non-Fat milk  Fat-free Lactaid Milk  Sugar-free yogurt (Dannon Lite & Fit, Greek yogurt)     Clayton Bibles, MS, RD, LDN Pager: [phone] After Hours Pager: [phone]

## 2015-02-11 NOTE — Progress Notes (Signed)
Patient alert and oriented, Post op day 1.  Provided support and encouragement.  Encouraged pulmonary toilet, ambulation and small sips of liquids.  All questions answered.  Will continue to monitor. 

## 2015-02-12 LAB — CBC WITH DIFFERENTIAL/PLATELET
Basophils Absolute: 0 10*3/uL (ref 0.0–0.1)
Basophils Relative: 0 % (ref 0–1)
Eosinophils Absolute: 0.1 10*3/uL (ref 0.0–0.7)
Eosinophils Relative: 1 % (ref 0–5)
HEMATOCRIT: 37.2 % (ref 36.0–46.0)
Hemoglobin: 12.1 g/dL (ref 12.0–15.0)
LYMPHS PCT: 43 % (ref 12–46)
Lymphs Abs: 4.1 10*3/uL — ABNORMAL HIGH (ref 0.7–4.0)
MCH: 27.8 pg (ref 26.0–34.0)
MCHC: 32.5 g/dL (ref 30.0–36.0)
MCV: 85.5 fL (ref 78.0–100.0)
Monocytes Absolute: 1.1 10*3/uL — ABNORMAL HIGH (ref 0.1–1.0)
Monocytes Relative: 11 % (ref 3–12)
Neutro Abs: 4.3 10*3/uL (ref 1.7–7.7)
Neutrophils Relative %: 45 % (ref 43–77)
Platelets: 295 10*3/uL (ref 150–400)
RBC: 4.35 MIL/uL (ref 3.87–5.11)
RDW: 13.5 % (ref 11.5–15.5)
WBC: 9.6 10*3/uL (ref 4.0–10.5)

## 2015-02-12 MED ORDER — ONDANSETRON 4 MG PO TBDP: 4.0000 mg | ORAL_TABLET | Freq: Three times a day (TID) | ORAL | Status: DC | PRN

## 2015-02-12 MED ORDER — HYDROXYZINE HCL 50 MG PO TABS: ORAL_TABLET | ORAL | Status: DC

## 2015-02-12 MED ORDER — OXYCODONE HCL 5 MG/5ML PO SOLN: 5.0000 mg | ORAL | Status: DC | PRN

## 2015-02-12 NOTE — Progress Notes (Signed)
2 Days Post-Op  Subjective: Had lower abd gas pain yesterday but pretty much resolved. No n/v. Tolerated water. Ambulated.   Objective: Vital signs in last 24 hours: Temp:  [98.1 F (36.7 C)-99.4 F (37.4 C)] 98.9 F (37.2 C) (07/21 0506) Pulse Rate:  [67-78] 77 (07/21 0506) Resp:  [16-20] 16 (07/21 0506) BP: (127-144)/(75-90) 130/83 mmHg (07/21 0506) SpO2:  [97 %-100 %] 100 % (07/21 0506) Last BM Date: 02/09/15  Intake/Output from previous day: 07/20 0701 - 07/21 0700 In: 3165 [I.V.:3000; IV Piggyback:165] Out: 1400 [Urine:1400] Intake/Output this shift:    Alert, nad cta b/l Reg Soft, min TTP, incisions c/d/i No edema  Lab Results:   Recent Labs  02/11/15 0443 02/11/15 1600 02/12/15 0503  WBC 11.0*  --  9.6  HGB 12.3 12.3 12.1  HCT 37.9 37.8 37.2  PLT 319  --  295   BMET  Recent Labs  02/11/15 0443  NA 139  K 4.7  CL 105  CO2 29  GLUCOSE 109*  BUN 8  CREATININE 0.78  CALCIUM 8.5*   PT/INR No results for input(s): LABPROT, INR in the last 72 hours. ABG No results for input(s): PHART, HCO3 in the last 72 hours.  Invalid input(s): PCO2, PO2  Studies/Results: Dg Ugi W/water Sol Cm  02/11/2015   CLINICAL DATA:  Status post gastric bypass, 1 day.  Nausea.  EXAM: WATER SOLUBLE UPPER GI SERIES  TECHNIQUE: Single-column upper GI series was performed using water soluble contrast.  CONTRAST:  54mL OMNIPAQUE IOHEXOL 300 MG/ML  SOLN  COMPARISON:  Preoperative study of 11/18/2014  FLUOROSCOPY TIME:  If the device does not provide the exposure index:  Fluoroscopy Time (in minutes and seconds):  2 minutes and 57 seconds  Number of Acquired Images:  1  FINDINGS: Preprocedure scout film demonstrates cholecystectomy clips. No free intraperitoneal air.  Focused single-contrast exam demonstrates normal appearance of the gastric pouch. Prompt filling of the Roux loop, without contrast extravasation. The Roux loop is normal in caliber. The jejunal-jejunal anastomosis is not  definitely visualized.  IMPRESSION: Expected appearance after gastric bypass.  No acute complication.   Electronically Signed   By: Abigail Miyamoto M.D.   On: 02/11/2015 10:32    Anti-infectives: Anti-infectives    Start     Dose/Rate Route Frequency Ordered Stop   02/10/15 0537  cefoTEtan in Dextrose 5% (CEFOTAN) IVPB 2 g     2 g Intravenous On call to O.R. 02/10/15 0537 02/10/15 0723      Assessment/Plan: s/p Procedure(s): LAPAROSCOPIC ROUX-EN-Y GASTRIC BYPASS  (N/A) UPPER GI ENDOSCOPY (N/A)  Has typical resolving gas pain Adv to pod 2 diet Labs/vitals ok Cont chemical vte prophlyaxis prob dc later today Discussed dc instructions  Leighton Ruff. Redmond Pulling, MD, FACS General, Bariatric, & Minimally Invasive Surgery [facility], [place]   LOS: 2 days    Gayland Curry 02/12/2015

## 2015-02-12 NOTE — Discharge Summary (Signed)
Physician Discharge Summary  Kristine Lawson:[id number] DOB: 1971/04/10 DOA: 02/10/2015  PCP: Tula Nakayama, MD  Admit date: 02/10/2015 Discharge date: 02/12/2015  Recommendations for Outpatient Follow-up:   Follow-up Information    Follow up with Gayland Curry, MD. Go on 02/19/2015.   Specialty:  General Surgery   Why:  at 9:30 For Post-Op Check   Contact information:   [place] [address] [phone]      Discharge Diagnoses:  Active Problems:   S/P gastric bypass morbid obesity Prediabetes Hypertension OA bilateral knees GERD Elevated LDL  Surgical Procedure: Laparoscopic Roux-en-Y gastric bypass, upper endoscopy  Discharge Condition: Good Disposition: Home  Diet recommendation: Postoperative gastric bypass diet  Filed Weights   02/10/15 0530 02/10/15 0540  Weight: 117.708 kg (259 lb 8 oz) 117.708 kg (259 lb 8 oz)     Hospital Course:  The patient was admitted for a planned laparoscopic Roux-en-Y gastric bypass. Please see operative note. Preoperatively the patient was given 5000 units of subcutaneous heparin for DVT prophylaxis. Postoperative prophylactic Lovenox dosing was started on the morning of postoperative day 1. The patient underwent an upper GI on postoperative day 1 which demonstrated no extravasation of contrast and emptying of the contrast into the Roux limb. The patient was started on ice chips and water which they tolerated. She did have some itching with morphine so we switched to dilaudid.  On postoperative day 2 The patient's diet was advanced to protein shakes which they also tolerated. The patient was ambulating without difficulty. Their vital signs are stable without fever or tachycardia. Their hemoglobin had remained stable.  The patient had received discharge instructions and counseling. They were deemed stable for discharge.   Discharge Instructions  Discharge Instructions    Ambulate hourly while awake     Complete by:  As directed      Call MD for:  difficulty breathing, headache or visual disturbances    Complete by:  As directed      Call MD for:  persistant dizziness or light-headedness    Complete by:  As directed      Call MD for:  persistant nausea and vomiting    Complete by:  As directed      Call MD for:  redness, tenderness, or signs of infection (pain, swelling, redness, odor or green/yellow discharge around incision site)    Complete by:  As directed      Call MD for:  severe uncontrolled pain    Complete by:  As directed      Call MD for:  temperature >101 F    Complete by:  As directed      Diet bariatric full liquid    Complete by:  As directed      Discharge instructions    Complete by:  As directed   See bariatric discharge instructions     Incentive spirometry    Complete by:  As directed   Perform hourly while awake            Medication List    STOP taking these medications        acyclovir 400 MG tablet  Commonly known as:  ZOVIRAX     BENADRYL 25 MG tablet  Generic drug:  diphenhydrAMINE     gabapentin 300 MG capsule  Commonly known as:  NEURONTIN     ibuprofen 800 MG tablet  Commonly known as:  ADVIL,MOTRIN     naproxen 500 MG tablet  Commonly known as:  NAPROSYN     sulfamethoxazole-trimethoprim 800-160 MG per tablet  Commonly known as:  BACTRIM DS,SEPTRA DS     Vitamin D (Ergocalciferol) 50000 UNITS Caps capsule  Commonly known as:  DRISDOL      TAKE these medications        cyclobenzaprine 10 MG tablet  Commonly known as:  FLEXERIL  Take 1 tablet (10 mg total) by mouth at bedtime.     hydrOXYzine 50 MG tablet  Commonly known as:  ATARAX/VISTARIL  TAKE 3 TABLETS BY MOUTH AT BEDTIME.     NASACORT ALLERGY 24HR 55 MCG/ACT Aero nasal inhaler  Generic drug:  triamcinolone  Place 1 spray into the nose every morning.     ondansetron 4 MG disintegrating tablet  Commonly known as:  ZOFRAN ODT  Take 1 tablet (4 mg total) by mouth every  8 (eight) hours as needed for nausea or vomiting.     oxyCODONE 5 MG/5ML solution  Commonly known as:  ROXICODONE  Take 5-10 mLs (5-10 mg total) by mouth every 4 (four) hours as needed for moderate pain or severe pain.     ranitidine 300 MG tablet  Commonly known as:  ZANTAC  Take 300 mg by mouth 2 (two) times daily.     traZODone 50 MG tablet  Commonly known as:  DESYREL  Take 1 tablet (50 mg total) by mouth at bedtime.     triamterene-hydrochlorothiazide 37.5-25 MG per tablet  Commonly known as:  MAXZIDE-25  TAKE ONE TABLET BY MOUTH ONCE DAILY  Notes to Patient:  Monitor BP daily and keep a log for PCP, may need to make medication adjustments with rapid weight loss           Follow-up Information    Follow up with Gayland Curry, MD. Go on 02/19/2015.   Specialty:  General Surgery   Why:  at 9:30 For Post-Op Check   Contact information:   [place] [address] [phone]        The results of significant diagnostics from this hospitalization (including imaging, microbiology, ancillary and laboratory) are listed below for reference.    Significant Diagnostic Studies: Dg Ugi W/water Sol Cm  02/11/2015   CLINICAL DATA:  Status post gastric bypass, 1 day.  Nausea.  EXAM: WATER SOLUBLE UPPER GI SERIES  TECHNIQUE: Single-column upper GI series was performed using water soluble contrast.  CONTRAST:  72mL OMNIPAQUE IOHEXOL 300 MG/ML  SOLN  COMPARISON:  Preoperative study of 11/18/2014  FLUOROSCOPY TIME:  If the device does not provide the exposure index:  Fluoroscopy Time (in minutes and seconds):  2 minutes and 57 seconds  Number of Acquired Images:  1  FINDINGS: Preprocedure scout film demonstrates cholecystectomy clips. No free intraperitoneal air.  Focused single-contrast exam demonstrates normal appearance of the gastric pouch. Prompt filling of the Roux loop, without contrast extravasation. The Roux loop is normal in caliber. The jejunal-jejunal  anastomosis is not definitely visualized.  IMPRESSION: Expected appearance after gastric bypass.  No acute complication.   Electronically Signed   By: Abigail Miyamoto M.D.   On: 02/11/2015 10:32    Labs: Basic Metabolic Panel:  Recent Labs Lab 02/11/15 0443  NA 139  K 4.7  CL 105  CO2 29  GLUCOSE 109*  BUN 8  CREATININE 0.78  CALCIUM 8.5*   Liver Function Tests:  Recent Labs Lab 02/11/15 0443  AST 30  ALT 32  ALKPHOS 39  BILITOT 0.5  PROT 7.0  ALBUMIN 3.4*    CBC:  Recent Labs Lab 02/10/15 1206 02/11/15 0443 02/11/15 1600 02/12/15 0503  WBC  --  11.0*  --  9.6  NEUTROABS  --  7.8*  --  4.3  HGB 12.8 12.3 12.3 12.1  HCT 40.0 37.9 37.8 37.2  MCV  --  85.9  --  85.5  PLT  --  319  --  295    CBG: No results for input(s): GLUCAP in the last 168 hours.  Active Problems:   S/P gastric bypass   Time coordinating discharge: 15 minutes  Signed:  Gayland Curry, MD [facility], [place] [phone] 02/12/2015, 10:59 AM

## 2015-02-12 NOTE — Consult Note (Signed)
   [facility] CM Inpatient Consult   02/12/2015  Kristine Lawson 1971-06-14 [id number]   Went to bedside to speak with patient just prior to discharge. Discussed [facility]/Link to Wellness program for [employer] employees/dependents with [facility] insurance. Discussed how she is eligible based on pre-diabetes. Left Link to Wellness packet, brochure, and contact information for her to contact if she should become interested in the future. Appreciative of visit. Made inpatient RNCM aware of visit.  Marthenia Rolling, MSN-Ed, RN,BSN [facility] Liaison [phone]

## 2015-02-12 NOTE — Progress Notes (Signed)
Patient alert and oriented, pain is controlled. Patient is tolerating fluids,  advanced to protein shake today, patient tolerated well. Reviewed Gastric Bypass discharge instructions with patient and patient is able to articulate understanding. Provided information on BELT program, Support Group and [facility] outpatient pharmacy. All questions answered, will continue to monitor.    

## 2015-02-12 NOTE — Discharge Instructions (Signed)

## 2015-02-13 ENCOUNTER — Telehealth (HOSPITAL_COMMUNITY): Payer: Self-pay

## 2015-02-13 NOTE — Telephone Encounter (Signed)
Made discharge phone call to patient per DROP protocol. Asking the following questions.    1. Do you have someone to care for you now that you are home?  yes 2. Are you having pain now that is not relieved by your pain medication?  No, a little soreness, but it works 3. Are you able to drink the recommended daily amount of fluids (48 ounces minimum/day) and protein (60-80 grams/day) as prescribed by the dietitian or nutritional counselor?  Yes,I have gotten down 1 whole shake today and 1/2 bottle of water 4. Are you taking the vitamins and minerals as prescribed?  yes 5. Do you have the "on call" number to contact your surgeon if you have a problem or question?  yes 6. Are your incisions free of redness, swelling or drainage? (If steri strips, address that these can fall off, shower as tolerated) yes 7. Have your bowels moved since your surgery?  If not, are you passing gas?  yes 8. Are you up and walking 3-4 times per day?  yes    1. Do you have an appointment made to see your surgeon in the next month?  yes 2. Were you provided your discharge medications before your surgery or before you were discharged from the hospital and are you taking them without problem?  yes 3. Were you provided phone numbers to the clinic/surgeon's office?  yes 4. Did you watch the patient education video module in the (clinic, surgeon's office, etc.) before your surgery? no 5. Do you have a discharge checklist that was provided to you in the hospital to reference with instructions on how to take care of yourself after surgery?  yes 6. Did you see a dietitian or nutritional counselor while you were in the hospital?  yes 7. Do you have an appointment to see a dietitian or nutritional counselor in the next month?  yes

## 2015-02-16 ENCOUNTER — Ambulatory Visit: Payer: 59 | Admitting: Family Medicine

## 2015-02-24 ENCOUNTER — Encounter: Payer: 59 | Attending: General Surgery

## 2015-02-24 VITALS — Ht 68.0 in | Wt 247.5 lb

## 2015-02-24 DIAGNOSIS — E669 Obesity, unspecified: Secondary | ICD-10-CM | POA: Insufficient documentation

## 2015-02-24 DIAGNOSIS — Z6841 Body Mass Index (BMI) 40.0 and over, adult: Secondary | ICD-10-CM | POA: Insufficient documentation

## 2015-02-24 DIAGNOSIS — Z713 Dietary counseling and surveillance: Secondary | ICD-10-CM | POA: Insufficient documentation

## 2015-02-24 NOTE — Progress Notes (Signed)
Bariatric Class:  Appt start time: 1530 end time:  1600.  2 Week Post-Operative Nutrition Class  Patient was seen on 02/24/2015 for Post-Operative Nutrition [facility].   Surgery date: 02/10/2015 Surgery type: RYGB Start weight at [facility]: 264.5 lbs on 12/02/14, 268 lbs on 01/12/15 Weight today: 247.5 lbs Weight change: 20.5 lbs  TANITA  BODY COMP RESULTS  01/12/15 02/24/15   BMI (kg/m^2) 40.7 37.6   Fat Mass (lbs) 139.5 131.0   Fat Free Mass (lbs) 128.5 116.5   Total Body Water (lbs) 94 85.5    The following the learning objectives were met by the patient during this course:  Identifies Phase 3A (Soft, High Proteins) Dietary Goals and will begin from 2 weeks post-operatively to 2 months post-operatively  Identifies appropriate sources of fluids and proteins   States protein recommendations and appropriate sources post-operatively  Identifies the need for appropriate texture modifications, mastication, and bite sizes when consuming solids  Identifies appropriate multivitamin and calcium sources post-operatively  Describes the need for physical activity post-operatively and will follow MD recommendations  States when to call healthcare provider regarding medication questions or post-operative complications  Handouts given during class include:  Phase 3A: Soft, High Protein Diet Handout  Follow-Up Plan: Patient will follow-up at [facility] in 6 weeks for 2 month post-op nutrition visit for diet advancement per MD.

## 2015-02-27 ENCOUNTER — Other Ambulatory Visit: Payer: Self-pay

## 2015-02-27 DIAGNOSIS — G4700 Insomnia, unspecified: Secondary | ICD-10-CM

## 2015-02-27 MED ORDER — TRAZODONE HCL 50 MG PO TABS: 50.0000 mg | ORAL_TABLET | Freq: Every day | ORAL | Status: DC

## 2015-03-23 ENCOUNTER — Encounter: Payer: Self-pay | Admitting: Family Medicine

## 2015-03-23 ENCOUNTER — Ambulatory Visit (INDEPENDENT_AMBULATORY_CARE_PROVIDER_SITE_OTHER): Payer: 59 | Admitting: Family Medicine

## 2015-03-23 ENCOUNTER — Other Ambulatory Visit: Payer: Self-pay | Admitting: Family Medicine

## 2015-03-23 VITALS — BP 142/86 | HR 88 | Resp 18 | Ht 67.0 in | Wt 237.0 lb

## 2015-03-23 DIAGNOSIS — N3000 Acute cystitis without hematuria: Secondary | ICD-10-CM

## 2015-03-23 DIAGNOSIS — R7302 Impaired glucose tolerance (oral): Secondary | ICD-10-CM | POA: Diagnosis not present

## 2015-03-23 DIAGNOSIS — N950 Postmenopausal bleeding: Secondary | ICD-10-CM

## 2015-03-23 DIAGNOSIS — G2581 Restless legs syndrome: Secondary | ICD-10-CM

## 2015-03-23 DIAGNOSIS — R350 Frequency of micturition: Secondary | ICD-10-CM

## 2015-03-23 DIAGNOSIS — E559 Vitamin D deficiency, unspecified: Secondary | ICD-10-CM

## 2015-03-23 DIAGNOSIS — G4700 Insomnia, unspecified: Secondary | ICD-10-CM

## 2015-03-23 DIAGNOSIS — Z658 Other specified problems related to psychosocial circumstances: Secondary | ICD-10-CM

## 2015-03-23 DIAGNOSIS — E669 Obesity, unspecified: Secondary | ICD-10-CM

## 2015-03-23 DIAGNOSIS — E785 Hyperlipidemia, unspecified: Secondary | ICD-10-CM

## 2015-03-23 DIAGNOSIS — I10 Essential (primary) hypertension: Secondary | ICD-10-CM

## 2015-03-23 DIAGNOSIS — Z9884 Bariatric surgery status: Secondary | ICD-10-CM

## 2015-03-23 LAB — POCT URINALYSIS DIPSTICK
GLUCOSE UA: NEGATIVE
Ketones, UA: 15
Leukocytes, UA: NEGATIVE
Nitrite, UA: NEGATIVE
RBC UA: NEGATIVE
Urobilinogen, UA: 1
pH, UA: 6

## 2015-03-23 NOTE — Patient Instructions (Addendum)
F/u in 4 month, call if you need me before  Labs will be sent and also the mirapex  It is important that you exercise regularly at least 30 minutes 5 times a week. If you develop chest pain, have severe difficulty breathing, or feel very tired, stop exercising immediately and seek medical attention   Congrats on improved health

## 2015-03-24 ENCOUNTER — Encounter: Payer: Self-pay | Admitting: Family Medicine

## 2015-03-24 LAB — COMPLETE METABOLIC PANEL WITH GFR
ALT: 17 U/L (ref 6–29)
AST: 20 U/L (ref 10–30)
Albumin: 4.3 g/dL (ref 3.6–5.1)
Alkaline Phosphatase: 44 U/L (ref 33–115)
BUN: 8 mg/dL (ref 7–25)
CO2: 27 mmol/L (ref 20–31)
CREATININE: 0.76 mg/dL (ref 0.50–1.10)
Calcium: 9.2 mg/dL (ref 8.6–10.2)
Chloride: 103 mmol/L (ref 98–110)
GFR, Est African American: >89 mL/min (ref >=60)
GFR, Est Non African American: >89 mL/min (ref >=60)
Glucose, Bld: 73 mg/dL (ref 65–99)
Potassium: 4.5 mmol/L (ref 3.5–5.3)
Sodium: 140 mmol/L (ref 135–146)
TOTAL PROTEIN: 7.1 g/dL (ref 6.1–8.1)
Total Bilirubin: 0.5 mg/dL (ref 0.2–1.2)

## 2015-03-24 LAB — VITAMIN D 25 HYDROXY (VIT D DEFICIENCY, FRACTURES): Vit D, 25-Hydroxy: 41 ng/mL (ref 30–100)

## 2015-03-24 LAB — LIPID PANEL
CHOL/HDL RATIO: 4.2 ratio (ref <=5.0)
CHOLESTEROL: 155 mg/dL (ref 125–200)
HDL: 37 mg/dL — ABNORMAL LOW (ref >=46)
LDL Cholesterol: 101 mg/dL (ref <130)
TRIGLYCERIDES: 83 mg/dL (ref <150)
VLDL: 17 mg/dL (ref <30)

## 2015-03-24 LAB — HEMOGLOBIN A1C
Hgb A1c MFr Bld: 5.6 % (ref <5.7)
MEAN PLASMA GLUCOSE: 114 mg/dL (ref <117)

## 2015-03-25 LAB — HIV ANTIBODY (ROUTINE TESTING W REFLEX): HIV 1&2 Ab, 4th Generation: NONREACTIVE

## 2015-03-25 MED ORDER — PRAMIPEXOLE DIHYDROCHLORIDE ER 0.375 MG PO TB24: ORAL_TABLET | ORAL | Status: DC

## 2015-03-25 NOTE — Assessment & Plan Note (Signed)
Patient educated about the importance of limiting  Carbohydrate intake , the need to commit to daily physical activity for a minimum of 30 minutes , and to commit weight loss. The fact that changes in all these areas will reduce or eliminate all together the development of diabetes is stressed.  Corrected with weight loss and change in diet  Diabetic Labs Latest Ref Rng 03/23/2015 02/11/2015 02/05/2015 10/09/2014 04/04/2014  HbA1c <5.7 % 5.6 - - 6.0(H) 5.8(H)  Chol 125 - 200 mg/dL 155 - - 177 -  HDL >=46 mg/dL 37(L) - - 42(L) -  Calc LDL <130 mg/dL 101 - - 124(H) -  Triglycerides <150 mg/dL 83 - - 53 -  Creatinine 0.50 - 1.10 mg/dL 0.76 0.78 0.85 0.74 0.76   BP/Weight 03/23/2015 02/24/2015 02/12/2015 02/10/2015 02/05/2015 01/12/2015 04/09/9449  Systolic BP 388 - 828 - 003 - -  Diastolic BP 86 - 95 - 76 - -  Wt. (Lbs) 237 247.5 - 259.5 268.13 268 264.4  BMI 37.11 37.64 - 39.47 40.78 40.76 40.21   No flowsheet data found.

## 2015-03-25 NOTE — Assessment & Plan Note (Signed)
Sleep hygiene reviewed and written information offered also. Prescription sent for  medication needed.  

## 2015-03-25 NOTE — Assessment & Plan Note (Signed)
Corrected with weekly vit D

## 2015-03-25 NOTE — Assessment & Plan Note (Signed)
Improved Patient re-educated about  the importance of commitment to a  minimum of 150 minutes of exercise per week.  The importance of healthy food choices with portion control discussed. Encouraged to start a food diary, count calories and to consider  joining a support group. Sample diet sheets offered. Goals set by the patient for the next several months.   Weight /BMI 03/23/2015 02/24/2015 02/10/2015  WEIGHT 237 lb 247 lb 8 oz 259 lb 8 oz  HEIGHT 5\' 7"  5\' 8"  5\' 8"   BMI 37.11 kg/m2 37.64 kg/m2 39.47 kg/m2    Current exercise per week 40 minutes.

## 2015-03-25 NOTE — Assessment & Plan Note (Signed)
Elevated at thisvisit, pt however reports normal blood pressure every where else, will hold on medication and re evaluate   DASH diet and commitment to daily physical activity for a minimum of 30 minutes discussed and encouraged, as a part of hypertension management. The importance of attaining a healthy weight is also discussed.  BP/Weight 03/23/2015 02/24/2015 02/12/2015 02/10/2015 02/05/2015 01/12/2015 4/96/7591  Systolic BP 638 - 466 - 599 - -  Diastolic BP 86 - 95 - 76 - -  Wt. (Lbs) 237 247.5 - 259.5 268.13 268 264.4  BMI 37.11 37.64 - 39.47 40.78 40.76 40.21

## 2015-03-25 NOTE — Assessment & Plan Note (Signed)
Hyperlipidemia:Low fat diet discussed and encouraged.   Lipid Panel  Lab Results  Component Value Date   CHOL 155 03/23/2015   HDL 37* 03/23/2015   LDLCALC 101 03/23/2015   TRIG 83 03/23/2015   CHOLHDL 4.2 03/23/2015   Needs to commit to exercise

## 2015-03-25 NOTE — Assessment & Plan Note (Signed)
Unchanged, however handling them as  Best able, 2 of her 3 children are a challenge

## 2015-03-25 NOTE — Assessment & Plan Note (Signed)
Doing well as far as weight loss is concerned, needs to start exercise

## 2015-03-25 NOTE — Assessment & Plan Note (Signed)
No response to gabapentin , trial of mirapex

## 2015-03-25 NOTE — Progress Notes (Signed)
Kristine Lawson     MRN: [id number]      DOB: 1970/11/22   HPI Kristine Lawson is here for follow up and re-evaluation of chronic medical conditions, medication management and review of any available recent lab and radiology data.  Preventive health is updated, specifically  Cancer screening and Immunization.   Recently had gastric bypass is doing very well as far as weight loss is concerned, however has not yet committed to exercise. Has seen gyne, recently had spotting and will return for re evaluation, she had endometrial biopsy, ablation, not expected  To bleed.  The PT denies any adverse reactions to current medications since the last visit.  She has stopped her bP med, today I have an elevated reading, will follow States gabapentin of no use for restless legs, she tosses and turns all night not good sleep wants to try mirapex. Does have the stress of 2 of her 3 children having significant mental health issues she has dealt with this for over 15 years, no interest in therapy ROS Denies recent fever or chills. Denies sinus pressure, nasal congestion, ear pain or sore throat. Denies chest congestion, productive cough or wheezing. Denies chest pains, palpitations and leg swelling Denies abdominal pain, nausea, vomiting,diarrhea or constipation.   C/o frequency and malodorous urine however , water intake limited to 24 ounces daily, Was treated for UTI approx 4 weeks ago Denies joint pain, swelling and limitation in mobility. Denies headaches, seizures, numbness, or tingling. . Denies skin break down or rash.   PE  BP 120/82 mmHg  Pulse 88  Resp 18  Ht 5\' 7"  (1.702 m)  Wt 237 lb (107.502 kg)  BMI 37.11 kg/m2  SpO2 98%  Patient alert and oriented and in no cardiopulmonary distress.  HEENT: No facial asymmetry, EOMI,   oropharynx pink and moist.  Neck supple no JVD, no mass.  Chest: Clear to auscultation bilaterally.  CVS: S1, S2 no murmurs, no S3.Regular rate.  ABD: Soft  non tender.   Ext: No edema  MS: Adequate ROM spine, shoulders, hips and knees.  Skin: Intact, no ulcerations or rash noted.  Psych: Good eye contact, normal affect. Memory intact not anxious or depressed appearing.  CNS: CN 2-12 intact, power,  normal throughout.no focal deficits noted.   Assessment & Plan   HYPERTENSION, BENIGN ESSENTIAL Elevated at thisvisit, pt however reports normal blood pressure every where else, will hold on medication and re evaluate   DASH diet and commitment to daily physical activity for a minimum of 30 minutes discussed and encouraged, as a part of hypertension management. The importance of attaining a healthy weight is also discussed.  BP/Weight 03/23/2015 02/24/2015 02/12/2015 02/10/2015 02/05/2015 01/12/2015 2/62/0355  Systolic BP 974 - 163 - 845 - -  Diastolic BP 86 - 95 - 76 - -  Wt. (Lbs) 237 247.5 - 259.5 268.13 268 264.4  BMI 37.11 37.64 - 39.47 40.78 40.76 40.21        Obesity (BMI 30-39.9) Improved Patient re-educated about  the importance of commitment to a  minimum of 150 minutes of exercise per week.  The importance of healthy food choices with portion control discussed. Encouraged to start a food diary, count calories and to consider  joining a support group. Sample diet sheets offered. Goals set by the patient for the next several months.   Weight /BMI 03/23/2015 02/24/2015 02/10/2015  WEIGHT 237 lb 247 lb 8 oz 259 lb 8 oz  HEIGHT 5\' 7"  5\' 8"   5\' 8"   BMI 37.11 kg/m2 37.64 kg/m2 39.47 kg/m2    Current exercise per week 40 minutes.   Restless legs syndrome (RLS) No response to gabapentin , trial of mirapex  Dyslipidemia Hyperlipidemia:Low fat diet discussed and encouraged.   Lipid Panel  Lab Results  Component Value Date   CHOL 155 03/23/2015   HDL 37* 03/23/2015   LDLCALC 101 03/23/2015   TRIG 83 03/23/2015   CHOLHDL 4.2 03/23/2015   Needs to commit to exercise     IGT (impaired glucose tolerance) Patient educated  about the importance of limiting  Carbohydrate intake , the need to commit to daily physical activity for a minimum of 30 minutes , and to commit weight loss. The fact that changes in all these areas will reduce or eliminate all together the development of diabetes is stressed.  Corrected with weight loss and change in diet  Diabetic Labs Latest Ref Rng 03/23/2015 02/11/2015 02/05/2015 10/09/2014 04/04/2014  HbA1c <5.7 % 5.6 - - 6.0(H) 5.8(H)  Chol 125 - 200 mg/dL 155 - - 177 -  HDL >=46 mg/dL 37(L) - - 42(L) -  Calc LDL <130 mg/dL 101 - - 124(H) -  Triglycerides <150 mg/dL 83 - - 53 -  Creatinine 0.50 - 1.10 mg/dL 0.76 0.78 0.85 0.74 0.76   BP/Weight 03/23/2015 02/24/2015 02/12/2015 02/10/2015 02/05/2015 01/12/2015 01/11/5092  Systolic BP 267 - 124 - 580 - -  Diastolic BP 86 - 95 - 76 - -  Wt. (Lbs) 237 247.5 - 259.5 268.13 268 264.4  BMI 37.11 37.64 - 39.47 40.78 40.76 40.21   No flowsheet data found.     Psychosocial stressors Unchanged, however handling them as  Best able, 2 of her 3 children are a challenge  Insomnia Sleep hygiene reviewed and written information offered also. Prescription sent for  medication needed.   S/P gastric bypass Doing well as far as weight loss is concerned, needs to start exercise  PMB (postmenopausal bleeding) Recurrent spotting, will see gyne once more  Vitamin D deficiency Corrected with weekly vit D  Acute cystitis Symptomatic but normal uA, encouraged to increase water intake

## 2015-03-25 NOTE — Assessment & Plan Note (Signed)
Symptomatic but normal uA, encouraged to increase water intake

## 2015-03-25 NOTE — Assessment & Plan Note (Signed)
Recurrent spotting, will see gyne once more

## 2015-04-03 ENCOUNTER — Other Ambulatory Visit: Payer: Self-pay | Admitting: Family Medicine

## 2015-04-06 ENCOUNTER — Ambulatory Visit: Payer: 59 | Admitting: Dietician

## 2015-04-10 ENCOUNTER — Ambulatory Visit: Payer: 59 | Admitting: Dietician

## 2015-06-11 ENCOUNTER — Encounter: Payer: Self-pay | Admitting: Family Medicine

## 2015-06-11 DIAGNOSIS — N3000 Acute cystitis without hematuria: Secondary | ICD-10-CM

## 2015-06-13 ENCOUNTER — Telehealth: Payer: Self-pay | Admitting: Family Medicine

## 2015-06-13 NOTE — Telephone Encounter (Signed)
Urine culture ordered on 11/18 on this pt. Once submitted, which I am not seeing today on the 19th, pls send in cipro 500 mg one twice daily # 6 and let her know, thanks

## 2015-06-22 NOTE — Telephone Encounter (Signed)
Pt wants to wait until urine culture comes back. Did not submit yet due to daughter being sick. Will do it when able

## 2015-07-22 ENCOUNTER — Encounter: Payer: Self-pay | Admitting: Family Medicine

## 2015-07-23 ENCOUNTER — Ambulatory Visit: Payer: 59 | Admitting: Family Medicine

## 2015-09-07 ENCOUNTER — Ambulatory Visit: Payer: 59 | Admitting: Dietician

## 2015-09-07 DIAGNOSIS — Z01419 Encounter for gynecological examination (general) (routine) without abnormal findings: Secondary | ICD-10-CM | POA: Diagnosis not present

## 2015-09-16 ENCOUNTER — Other Ambulatory Visit: Payer: Self-pay | Admitting: Family Medicine

## 2015-09-16 MED FILL — hydrOXYzine HCL 50 MG TABS: 50 | 90 days supply | Qty: 270 | Fill #1

## 2015-09-16 MED FILL — VIT D2 1.25 MG (50,000 UNIT: 1.25 MG | 84 days supply | Qty: 12 | Fill #3

## 2015-09-18 MED FILL — traZODone HCL 50 MG TABS: 50 | 90 days supply | Qty: 90 | Fill #0

## 2015-09-18 MED FILL — TRIAMTERENE-HCTZ 37.5-25 MG: 37.5-25 | 90 days supply | Qty: 90 | Fill #0

## 2015-09-24 ENCOUNTER — Other Ambulatory Visit: Payer: Self-pay | Admitting: Family Medicine

## 2015-09-25 MED FILL — CYCLOBENZAPRINE 10 MG TAB: 10 | 90 days supply | Qty: 90 | Fill #0

## 2015-10-07 ENCOUNTER — Encounter: Payer: Self-pay | Admitting: Family Medicine

## 2015-10-07 ENCOUNTER — Ambulatory Visit (INDEPENDENT_AMBULATORY_CARE_PROVIDER_SITE_OTHER): Payer: 59 | Admitting: Family Medicine

## 2015-10-07 VITALS — BP 122/84 | HR 92 | Resp 16 | Ht 68.0 in | Wt 197.0 lb

## 2015-10-07 DIAGNOSIS — I10 Essential (primary) hypertension: Secondary | ICD-10-CM

## 2015-10-07 DIAGNOSIS — E041 Nontoxic single thyroid nodule: Secondary | ICD-10-CM

## 2015-10-07 DIAGNOSIS — E8881 Metabolic syndrome: Secondary | ICD-10-CM

## 2015-10-07 DIAGNOSIS — D649 Anemia, unspecified: Secondary | ICD-10-CM

## 2015-10-07 DIAGNOSIS — E049 Nontoxic goiter, unspecified: Secondary | ICD-10-CM | POA: Insufficient documentation

## 2015-10-07 DIAGNOSIS — R7302 Impaired glucose tolerance (oral): Secondary | ICD-10-CM

## 2015-10-07 DIAGNOSIS — Z658 Other specified problems related to psychosocial circumstances: Secondary | ICD-10-CM

## 2015-10-07 DIAGNOSIS — G4700 Insomnia, unspecified: Secondary | ICD-10-CM

## 2015-10-07 DIAGNOSIS — E785 Hyperlipidemia, unspecified: Secondary | ICD-10-CM | POA: Diagnosis not present

## 2015-10-07 DIAGNOSIS — D259 Leiomyoma of uterus, unspecified: Secondary | ICD-10-CM

## 2015-10-07 DIAGNOSIS — E663 Overweight: Secondary | ICD-10-CM

## 2015-10-07 DIAGNOSIS — M25561 Pain in right knee: Secondary | ICD-10-CM

## 2015-10-07 DIAGNOSIS — E559 Vitamin D deficiency, unspecified: Secondary | ICD-10-CM

## 2015-10-07 NOTE — Progress Notes (Signed)
   Subjective:    Patient ID: Kristine Lawson, female    DOB: February 09, 1971, 45 y.o.   MRN: [id number]  HPI   TAKIYA MICHAUX     MRN: [id number]      DOB: 01/20/1971   HPI [name] is here for follow up and re-evaluation of chronic medical conditions, medication management and review of any available recent lab and radiology data.  Preventive health is updated, specifically  Cancer screening and Immunization.   Questions or concerns regarding consultations or procedures which the PT has had in the interim are  addressed. The PT denies any adverse reactions to current medications since the last visit.  There are no new concerns.  There are no specific complaints   ROS Denies recent fever or chills. Denies sinus pressure, nasal congestion, ear pain or sore throat. Denies chest congestion, productive cough or wheezing. Denies chest pains, palpitations and leg swelling Denies abdominal pain, nausea, vomiting,diarrhea or constipation.   Denies dysuria, frequency, hesitancy or incontinence. Denies joint pain, swelling and limitation in mobility. Denies headaches, seizures, numbness, or tingling. Denies depression, anxiety or insomnia. Denies skin break down or rash.   PE  BP 122/84 mmHg  Pulse 92  Resp 16  Ht 5\' 8"  (1.727 m)  Wt 197 lb (89.359 kg)  BMI 29.96 kg/m2  SpO2 98%  Patient alert and oriented and in no cardiopulmonary distress.  HEENT: No facial asymmetry, EOMI,   oropharynx pink and moist.  Neck supple   Chest: Clear to auscultation bilaterally.  CVS: S1, S2 no murmurs, no S3.Regular rate.  ABD: Soft non tender.   Ext: No edema  MS: Adequate ROM spine, shoulders, hips and knees.  Skin: Intact, no ulcerations or rash noted.  Psych: Good eye contact, normal affect. Memory intact not anxious or depressed appearing.  CNS: CN 2-12 intact, power,  normal throughout.no focal deficits noted.   Assessment & Plan   Goiter Repeat US needed, to follow up,  has dominant nodule with multiple nodules, first noted in 2016  HYPERTENSION, BENIGN ESSENTIAL Controlled, no change in medication DASH diet and commitment to daily physical activity for a minimum of 30 minutes discussed and encouraged, as a part of hypertension management. The importance of attaining a healthy weight is also discussed.  BP/Weight 10/07/2015 03/23/2015 02/24/2015 02/12/2015 02/10/2015 02/05/2015 [id number]  Systolic BP [id number] - [id number] - 0000000 -  Diastolic BP 84 86 - 95 - 76 -  Wt. (Lbs) 197 237 247.5 - 259.5 268.13 268  BMI 29.96 37.11 37.64 - 39.47 40.78 40.76        Vitamin D deficiency Continue supplement esp with gastric bypass surgery. Updated lab needed at/ before next visit.   Insomnia Sleep hygiene reviewed and written information offered also. Prescription sent for  medication needed.   Psychosocial stressors Ongoing challenges, but handling them increasingly well as her physical health improves  Overweight (BMI 25.0-29.9) Improved. Pt applauded on succesful weight loss through lifestyle change, and encouraged to continue same. Weight loss goal set for the next several months.   KNEE PAIN, RIGHT Markedly improved with weight loss       Review of Systems     Objective:   Physical Exam       Assessment & Plan:

## 2015-10-07 NOTE — Patient Instructions (Addendum)
F/u in November, call if you need me before  CONGRATS on gREAT health, no change in meds  Fasting labs in August 30 or after,   You are referred for thyroid ultrasound  Please schedule mammogram at [facility], due  Meds refilled for 9 months  Thanks for choosing [facility], we consider it a privelige to serve you.'

## 2015-10-08 ENCOUNTER — Other Ambulatory Visit: Payer: Self-pay | Admitting: Family Medicine

## 2015-10-08 ENCOUNTER — Other Ambulatory Visit: Payer: Self-pay

## 2015-10-08 DIAGNOSIS — Z1231 Encounter for screening mammogram for malignant neoplasm of breast: Secondary | ICD-10-CM

## 2015-10-08 DIAGNOSIS — D219 Benign neoplasm of connective and other soft tissue, unspecified: Secondary | ICD-10-CM | POA: Insufficient documentation

## 2015-10-08 NOTE — Assessment & Plan Note (Signed)
Ongoing challenges, but handling them increasingly well as her physical health improves

## 2015-10-08 NOTE — Assessment & Plan Note (Addendum)
Repeat US needed, to follow up, has dominant nodule with multiple nodules, first noted in 2016

## 2015-10-08 NOTE — Assessment & Plan Note (Signed)
Continue supplement esp with gastric bypass surgery. Updated lab needed at/ before next visit.

## 2015-10-08 NOTE — Assessment & Plan Note (Signed)
Improved. Pt applauded on succesful weight loss through lifestyle change, and encouraged to continue same. Weight loss goal set for the next several months.  

## 2015-10-08 NOTE — Assessment & Plan Note (Signed)
Sleep hygiene reviewed and written information offered also. Prescription sent for  medication needed.  

## 2015-10-08 NOTE — Assessment & Plan Note (Signed)
Controlled, no change in medication DASH diet and commitment to daily physical activity for a minimum of 30 minutes discussed and encouraged, as a part of hypertension management. The importance of attaining a healthy weight is also discussed.  BP/Weight 10/07/2015 03/23/2015 02/24/2015 02/12/2015 02/10/2015 02/05/2015 123456  Systolic BP 123XX123 A999333 - Q000111Q - 0000000 -  Diastolic BP 84 86 - 95 - 76 -  Wt. (Lbs) 197 237 247.5 - 259.5 268.13 268  BMI 29.96 37.11 37.64 - 39.47 40.78 40.76

## 2015-10-08 NOTE — Assessment & Plan Note (Signed)
Markedly improved with weight loss

## 2015-10-15 ENCOUNTER — Other Ambulatory Visit (HOSPITAL_COMMUNITY): Payer: 59

## 2015-10-23 ENCOUNTER — Ambulatory Visit (HOSPITAL_COMMUNITY)
Admission: RE | Admit: 2015-10-23 | Discharge: 2015-10-23 | Disposition: A | Discharge status: Home / Self Care | Payer: 59 | Source: Ambulatory Visit | Attending: Family Medicine | Admitting: Family Medicine

## 2015-10-23 ENCOUNTER — Encounter: Payer: Self-pay | Admitting: Family Medicine

## 2015-10-23 ENCOUNTER — Other Ambulatory Visit: Payer: Self-pay | Admitting: Family Medicine

## 2015-10-23 DIAGNOSIS — E049 Nontoxic goiter, unspecified: Secondary | ICD-10-CM

## 2015-10-23 DIAGNOSIS — E041 Nontoxic single thyroid nodule: Secondary | ICD-10-CM

## 2015-10-23 DIAGNOSIS — Z1231 Encounter for screening mammogram for malignant neoplasm of breast: Secondary | ICD-10-CM | POA: Diagnosis not present

## 2015-10-23 DIAGNOSIS — E042 Nontoxic multinodular goiter: Secondary | ICD-10-CM | POA: Diagnosis not present

## 2015-10-26 ENCOUNTER — Other Ambulatory Visit (INDEPENDENT_AMBULATORY_CARE_PROVIDER_SITE_OTHER): Payer: Self-pay | Admitting: Otolaryngology

## 2015-10-26 DIAGNOSIS — E041 Nontoxic single thyroid nodule: Secondary | ICD-10-CM

## 2015-10-29 ENCOUNTER — Other Ambulatory Visit (HOSPITAL_COMMUNITY): Payer: 59

## 2015-10-29 ENCOUNTER — Ambulatory Visit (HOSPITAL_COMMUNITY): Payer: 59

## 2015-10-30 ENCOUNTER — Encounter (HOSPITAL_COMMUNITY): Payer: Self-pay

## 2015-10-30 ENCOUNTER — Ambulatory Visit (HOSPITAL_COMMUNITY)
Admission: RE | Admit: 2015-10-30 | Discharge: 2015-10-30 | Disposition: A | Discharge status: Home / Self Care | Payer: 59 | Source: Ambulatory Visit | Attending: Otolaryngology | Admitting: Otolaryngology

## 2015-10-30 DIAGNOSIS — E041 Nontoxic single thyroid nodule: Secondary | ICD-10-CM | POA: Insufficient documentation

## 2015-10-30 MED ORDER — LIDOCAINE HCL (PF) 2 % IJ SOLN
INTRAMUSCULAR | Status: AC
  Filled 2015-10-30: qty 10

## 2015-10-30 NOTE — Procedures (Signed)
PreOperative Dx: RT thyroid nodule Postoperative Dx: RT thyroid nodule Procedure:   US guided FNA of RT thyroid nodule Radiologist:  Thornton Papas Anesthesia:  4.5 ml of 2% lidocaine Specimen:  FNA x 3  EBL:   < 1 ml Complications: None

## 2015-10-30 NOTE — Discharge Instructions (Signed)

## 2015-11-05 ENCOUNTER — Ambulatory Visit (INDEPENDENT_AMBULATORY_CARE_PROVIDER_SITE_OTHER): Payer: 59 | Admitting: Otolaryngology

## 2015-11-05 DIAGNOSIS — D440 Neoplasm of uncertain behavior of thyroid gland: Secondary | ICD-10-CM

## 2015-11-07 ENCOUNTER — Telehealth: Payer: Self-pay | Admitting: Family Medicine

## 2015-11-07 NOTE — Telephone Encounter (Signed)
Pls call pt and review what I sent to her in e mail re her thyroid US , she has not read the e mail, and I have referred her back to Dr Benjamine Mola, based on report, thanks

## 2015-11-09 NOTE — Telephone Encounter (Signed)
Patient aware and already had biopsy

## 2015-12-08 ENCOUNTER — Encounter: Payer: Self-pay | Admitting: Family Medicine

## 2015-12-15 ENCOUNTER — Encounter: Payer: Self-pay | Admitting: Family Medicine

## 2015-12-15 ENCOUNTER — Ambulatory Visit (INDEPENDENT_AMBULATORY_CARE_PROVIDER_SITE_OTHER): Payer: 59 | Admitting: Family Medicine

## 2015-12-15 VITALS — BP 118/82 | HR 92 | Temp 99.0°F | Resp 16 | Ht 68.0 in | Wt 195.1 lb

## 2015-12-15 DIAGNOSIS — E663 Overweight: Secondary | ICD-10-CM

## 2015-12-15 DIAGNOSIS — Z111 Encounter for screening for respiratory tuberculosis: Secondary | ICD-10-CM | POA: Diagnosis not present

## 2015-12-15 DIAGNOSIS — E785 Hyperlipidemia, unspecified: Secondary | ICD-10-CM | POA: Diagnosis not present

## 2015-12-15 DIAGNOSIS — Z0289 Encounter for other administrative examinations: Secondary | ICD-10-CM | POA: Diagnosis not present

## 2015-12-15 DIAGNOSIS — N731 Chronic parametritis and pelvic cellulitis: Secondary | ICD-10-CM | POA: Insufficient documentation

## 2015-12-15 DIAGNOSIS — N3000 Acute cystitis without hematuria: Secondary | ICD-10-CM

## 2015-12-15 DIAGNOSIS — I10 Essential (primary) hypertension: Secondary | ICD-10-CM | POA: Diagnosis not present

## 2015-12-15 DIAGNOSIS — E049 Nontoxic goiter, unspecified: Secondary | ICD-10-CM

## 2015-12-15 DIAGNOSIS — Z020 Encounter for examination for admission to educational institution: Secondary | ICD-10-CM

## 2015-12-15 DIAGNOSIS — E559 Vitamin D deficiency, unspecified: Secondary | ICD-10-CM | POA: Diagnosis not present

## 2015-12-15 DIAGNOSIS — G4700 Insomnia, unspecified: Secondary | ICD-10-CM

## 2015-12-15 DIAGNOSIS — D649 Anemia, unspecified: Secondary | ICD-10-CM | POA: Diagnosis not present

## 2015-12-15 LAB — POCT URINALYSIS DIPSTICK
Bilirubin, UA: NEGATIVE
Glucose, UA: NEGATIVE
Ketones, UA: NEGATIVE
NITRITE UA: NEGATIVE
PH UA: 6
Protein, UA: NEGATIVE
RBC UA: NEGATIVE
SPEC GRAV UA: 1.02
UROBILINOGEN UA: 0.2

## 2015-12-15 NOTE — Progress Notes (Signed)
Subjective:    Patient ID: Kristine Lawson, female    DOB: 05-07-1971, 45 y.o.   MRN: [id number]  HPI   RHEDA WYMA     MRN: [id number]      DOB: 1970/09/11   HPI [name] is here for follow up and re-evaluation of chronic medical conditions, medication management and review of any available recent lab and radiology data.  Preventive health is updated, specifically  Cancer screening and Immunization.   Questions or concerns regarding consultations or procedures which the PT has had in the interim are  addressed. The PT denies any adverse reactions to current medications since the last visit.  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 Denies sinus pressure, nasal congestion, ear pain or sore throat. Denies chest congestion, productive cough or wheezing. Denies chest pains, palpitations and leg swelling Denies abdominal pain, nausea, vomiting,diarrhea or constipation. 3 week h/o dysuria and malodorous urine, also preessure Denies joint pain, swelling and limitation in mobility. Denies headaches, seizures, numbness, or tingling. Denies depression, anxiety or insomnia. Denies skin break down or rash.   PE  BP 118/82 mmHg  Pulse 92  Temp(Src) 99 F (37.2 C) (Oral)  Resp 16  Ht 5\' 8"  (1.727 m)  Wt 195 lb 1.9 oz (88.506 kg)  BMI 29.67 kg/m2  SpO2 100%  Patient alert and oriented and in no cardiopulmonary distress.  HEENT: No facial asymmetry, EOMI,   oropharynx pink and moist.  Neck supple no JVD, no mass.  Chest: Clear to auscultation bilaterally.  CVS: S1, S2 no murmurs, no S3.Regular rate.  ABD: Soft non tender. No suprapubic or renal angle tenderness  Ext: No edema  MS: Adequate ROM spine, shoulders, hips and knees.  Skin: Intact, no ulcerations or rash  noted.  Psych: Good eye contact, normal affect. Memory intact not anxious or depressed appearing.  CNS: CN 2-12 intact, power,  normal throughout.no focal deficits noted.   Assessment & Plan   HYPERTENSION, BENIGN ESSENTIAL Controlled, no change in medication DASH diet and commitment to daily physical activity for a minimum of 30 minutes discussed and encouraged, as a part of hypertension management. The importance of attaining a healthy weight is also discussed.  BP/Weight 12/15/2015 10/30/2015 10/07/2015 03/23/2015 02/24/2015 02/12/2015 [id number]  Systolic BP 123456 A999333 123XX123 A999333 - Q000111Q -  Diastolic BP 82 86 84 86 - 95 -  Wt. (Lbs) 195.12 - 197 237 247.5 - 259.5  BMI 29.67 - 29.96 37.11 37.64 - 39.47        Routine general medical examination at a health care facility Annual exam as documented. Counseling done  re healthy lifestyle involving commitment to 150 minutes exercise per week, heart healthy diet, and attaining healthy weight.The importance of adequate sleep also discussed. Regular seat belt use and home safety, is also discussed. Changes in health habits are decided on by the patient with goals and time frames  set for achieving them. Immunization and cancer screening needs are specifically addressed at this visit.   Overweight (BMI 25.0-29.9) unchnaged Patient re-educated about  the importance of commitment to a  minimum of 150 minutes of exercise per week.  The importance of healthy food choices with portion control discussed. Encouraged to start a food diary, count calories and to consider  joining a support group. Sample diet sheets offered. Goals set by the patient for the next several months.   Weight /BMI 12/15/2015 10/07/2015 03/23/2015  WEIGHT 195 lb 1.9 oz 197 lb 237 lb  HEIGHT 5\' 8"  5\' 8"  5\' 7"   BMI 29.67 kg/m2 29.96 kg/m2 37.11 kg/m2    Current exercise per week 90 minutes.   School health examination Exam as documented. Counseling done  re healthy lifestyle  involving commitment to 150 minutes exercise per week, heart healthy diet, and attaining healthy weight.The importance of adequate sleep also discussed. Regular seat belt use and home safety, is also discussed. Changes in health habits are decided on by the patient with goals and time frames  set for achieving them. Immunization and cancer screening needs are specifically addressed at this visit.   Insomnia Sleep hygiene reviewed and written information offered also. Prescription sent for  medication needed.   Acute cystitis without hematuria Mildly symptomatic with abn UA, will treat once c/s available  Goiter Reports negative biopsy of thyroid nodule earlier this year, followed by eNT  Vitamin D deficiency Controlled, no change in medication        Review of Systems     Objective:   Physical Exam        Assessment & Plan:

## 2015-12-15 NOTE — Patient Instructions (Addendum)
F/u in 6 month, call if you need me sooner  All the best with school  TB test placed today, need to have this read in 2 days , Prefer here, if not need signed reading from qualified Professional   Urine is sent for further testing, no antibiotic at this time, you will be  referred to urologist for evaluation of pelvic pressure and urinary symptoms if no urinary tract infection  Need to send information stating date Dose 3 of Hep B given  Thank you  for choosing [facility]. We consider it a privelige to serve you.  Delivering excellent health care in a caring and  compassionate way is our goal.  Partnering with you,  so that together we can achieve this goal is our strategy.

## 2015-12-16 ENCOUNTER — Encounter: Payer: Self-pay | Admitting: Family Medicine

## 2015-12-16 DIAGNOSIS — Z9884 Bariatric surgery status: Secondary | ICD-10-CM

## 2015-12-16 LAB — COMPREHENSIVE METABOLIC PANEL
ALT: 16 U/L (ref 6–29)
AST: 18 U/L (ref 10–30)
Albumin: 4.2 g/dL (ref 3.6–5.1)
Alkaline Phosphatase: 61 U/L (ref 33–115)
BUN: 14 mg/dL (ref 7–25)
CALCIUM: 9.5 mg/dL (ref 8.6–10.2)
CO2: 29 mmol/L (ref 20–31)
Chloride: 103 mmol/L (ref 98–110)
Creat: 0.9 mg/dL (ref 0.50–1.10)
Glucose, Bld: 88 mg/dL (ref 65–99)
POTASSIUM: 4.2 mmol/L (ref 3.5–5.3)
SODIUM: 141 mmol/L (ref 135–146)
TOTAL PROTEIN: 7.5 g/dL (ref 6.1–8.1)
Total Bilirubin: 0.5 mg/dL (ref 0.2–1.2)

## 2015-12-16 LAB — LIPID PANEL
CHOL/HDL RATIO: 2.4 ratio (ref <=5.0)
CHOLESTEROL: 168 mg/dL (ref 125–200)
HDL: 71 mg/dL (ref >=46)
LDL Cholesterol: 83 mg/dL (ref <130)
TRIGLYCERIDES: 68 mg/dL (ref <150)
VLDL: 14 mg/dL (ref <30)

## 2015-12-16 LAB — MEASLES/MUMPS/RUBELLA IMMUNITY
MUMPS IGG: 52.7 [AU]/ml — AB (ref <9.00)
Rubella: 4.88 Index — ABNORMAL HIGH (ref <0.90)
Rubeola IgG: 70.9 AU/mL — ABNORMAL HIGH (ref <25.00)

## 2015-12-16 LAB — TSH: TSH: 0.5 m[IU]/L

## 2015-12-16 LAB — VARICELLA ZOSTER ANTIBODY, IGG: Varicella IgG: 515.3 Index — ABNORMAL HIGH (ref <135.00)

## 2015-12-16 LAB — VITAMIN B12: VITAMIN B 12: 1119 pg/mL — AB (ref 200–1100)

## 2015-12-16 LAB — VITAMIN D 25 HYDROXY (VIT D DEFICIENCY, FRACTURES): Vit D, 25-Hydroxy: 37 ng/mL (ref 30–100)

## 2015-12-17 ENCOUNTER — Encounter: Payer: Self-pay | Admitting: Family Medicine

## 2015-12-18 ENCOUNTER — Other Ambulatory Visit: Payer: Self-pay

## 2015-12-18 ENCOUNTER — Encounter: Payer: Self-pay | Admitting: Family Medicine

## 2015-12-18 ENCOUNTER — Other Ambulatory Visit: Payer: Self-pay | Admitting: Family Medicine

## 2015-12-18 LAB — URINE CULTURE: Colony Count: >=100000

## 2015-12-18 MED ORDER — CIPROFLOXACIN HCL 500 MG PO TABS: 500.0000 mg | ORAL_TABLET | Freq: Two times a day (BID) | ORAL | Status: DC

## 2015-12-18 MED ORDER — FLUCONAZOLE 150 MG PO TABS: 150.0000 mg | ORAL_TABLET | Freq: Once | ORAL | Status: DC

## 2015-12-18 MED FILL — FLUCONAZOLE 150 MG TABLET: 150 | 7 days supply | Qty: 2 | Fill #0

## 2015-12-18 MED FILL — CIPROFLOXACIN HCL 500 MG TA: 500 | 3 days supply | Qty: 6 | Fill #0

## 2015-12-20 DIAGNOSIS — N3000 Acute cystitis without hematuria: Secondary | ICD-10-CM | POA: Insufficient documentation

## 2015-12-20 DIAGNOSIS — Z020 Encounter for examination for admission to educational institution: Secondary | ICD-10-CM | POA: Insufficient documentation

## 2015-12-20 NOTE — Assessment & Plan Note (Signed)
Controlled, no change in medication  

## 2015-12-20 NOTE — Assessment & Plan Note (Addendum)
Exam as documented. Counseling done  re healthy lifestyle involving commitment to 150 minutes exercise per week, heart healthy diet, and attaining healthy weight.The importance of adequate sleep also discussed. Regular seat belt use and home safety, is also discussed. Changes in health habits are decided on by the patient with goals and time frames  set for achieving them. Immunization and cancer screening needs are specifically addressed at this visit.  

## 2015-12-20 NOTE — Assessment & Plan Note (Signed)
Controlled, no change in medication DASH diet and commitment to daily physical activity for a minimum of 30 minutes discussed and encouraged, as a part of hypertension management. The importance of attaining a healthy weight is also discussed.  BP/Weight 12/15/2015 10/30/2015 10/07/2015 03/23/2015 02/24/2015 02/12/2015 [id number]  Systolic BP 123456 A999333 123XX123 A999333 - Q000111Q -  Diastolic BP 82 86 84 86 - 95 -  Wt. (Lbs) 195.12 - 197 237 247.5 - 259.5  BMI 29.67 - 29.96 37.11 37.64 - 39.47

## 2015-12-20 NOTE — Assessment & Plan Note (Signed)
Reports negative biopsy of thyroid nodule earlier this year, followed by eNT

## 2015-12-20 NOTE — Assessment & Plan Note (Signed)
unchnaged Patient re-educated about  the importance of commitment to a  minimum of 150 minutes of exercise per week.  The importance of healthy food choices with portion control discussed. Encouraged to start a food diary, count calories and to consider  joining a support group. Sample diet sheets offered. Goals set by the patient for the next several months.   Weight /BMI 12/15/2015 10/07/2015 03/23/2015  WEIGHT 195 lb 1.9 oz 197 lb 237 lb  HEIGHT 5\' 8"  5\' 8"  5\' 7"   BMI 29.67 kg/m2 29.96 kg/m2 37.11 kg/m2    Current exercise per week 90 minutes.

## 2015-12-20 NOTE — Assessment & Plan Note (Signed)
Sleep hygiene reviewed and written information offered also. Prescription sent for  medication needed.  

## 2015-12-20 NOTE — Assessment & Plan Note (Signed)

## 2015-12-20 NOTE — Assessment & Plan Note (Signed)
Mildly symptomatic with abn UA, will treat once c/s available

## 2015-12-22 ENCOUNTER — Other Ambulatory Visit: Payer: Self-pay | Admitting: Family Medicine

## 2015-12-22 MED FILL — traZODone HCL 50 MG TABS: 50 | 90 days supply | Qty: 90 | Fill #1

## 2015-12-28 ENCOUNTER — Telehealth: Payer: Self-pay | Admitting: Family Medicine

## 2015-12-28 NOTE — Telephone Encounter (Signed)
She has a question about the physical form that was filled out for her, please advise?

## 2015-12-28 NOTE — Telephone Encounter (Signed)
Forwarding to nurse that assisted in completing form.

## 2015-12-28 NOTE — Telephone Encounter (Signed)
Concern addressed

## 2015-12-30 ENCOUNTER — Encounter: Payer: Self-pay | Admitting: Family Medicine

## 2015-12-30 ENCOUNTER — Other Ambulatory Visit: Payer: Self-pay

## 2015-12-30 DIAGNOSIS — E8881 Metabolic syndrome: Secondary | ICD-10-CM

## 2015-12-30 DIAGNOSIS — Z020 Encounter for examination for admission to educational institution: Secondary | ICD-10-CM

## 2015-12-30 DIAGNOSIS — Z9884 Bariatric surgery status: Secondary | ICD-10-CM

## 2016-01-04 DIAGNOSIS — Z9884 Bariatric surgery status: Secondary | ICD-10-CM | POA: Diagnosis not present

## 2016-01-04 DIAGNOSIS — Z0289 Encounter for other administrative examinations: Secondary | ICD-10-CM | POA: Diagnosis not present

## 2016-01-04 LAB — CBC
HCT: 37.6 % (ref 35.0–45.0)
Hemoglobin: 12.3 g/dL (ref 11.7–15.5)
MCH: 28.5 pg (ref 27.0–33.0)
MCHC: 32.7 g/dL (ref 32.0–36.0)
MCV: 87.2 fL (ref 80.0–100.0)
MPV: 8.3 fL (ref 7.5–12.5)
PLATELETS: 349 10*3/uL (ref 140–400)
RBC: 4.31 MIL/uL (ref 3.80–5.10)
RDW: 13.8 % (ref 11.0–15.0)
WBC: 5.4 10*3/uL (ref 3.8–10.8)

## 2016-01-05 ENCOUNTER — Encounter: Payer: Self-pay | Admitting: Family Medicine

## 2016-01-05 LAB — HEPATITIS B SURFACE ANTIBODY, QUANTITATIVE: Hepatitis B-Post: 339 m[IU]/mL

## 2016-01-18 MED FILL — VIT D2 1.25 MG (50,000 UNIT: 1.25 MG | 84 days supply | Qty: 12 | Fill #0

## 2016-01-18 MED FILL — CYCLOBENZAPRINE 10 MG TAB: 10 | 90 days supply | Qty: 90 | Fill #1

## 2016-01-19 ENCOUNTER — Other Ambulatory Visit: Payer: Self-pay

## 2016-01-19 MED ORDER — HYDROXYZINE HCL 50 MG PO TABS: ORAL_TABLET | ORAL | Status: DC

## 2016-01-19 MED ORDER — TRAMADOL HCL 50 MG PO TABS
50.0000 mg | ORAL_TABLET | Freq: Three times a day (TID) | ORAL | Status: DC | PRN
Start: 2016-01-19 — End: 2018-02-04

## 2016-01-19 MED FILL — traMADol HCL 50 MG TABS: 50 | 13 days supply | Qty: 40 | Fill #0

## 2016-01-19 MED FILL — hydrOXYzine HCL 50 MG TABS: 50 | 90 days supply | Qty: 270 | Fill #0

## 2016-02-08 ENCOUNTER — Encounter: Payer: Self-pay | Admitting: Family Medicine

## 2016-02-08 NOTE — Telephone Encounter (Signed)
I think I have an understanding of what you need.  Was the deadline 05/09/2015?  And Dr. Moshe Cipro is asking if you have some type of proof of when you received your flu shot in 2016.  We don't have your flu shot documented for last year.

## 2016-02-20 DIAGNOSIS — H524 Presbyopia: Secondary | ICD-10-CM | POA: Diagnosis not present

## 2016-02-20 DIAGNOSIS — H40013 Open angle with borderline findings, low risk, bilateral: Secondary | ICD-10-CM | POA: Diagnosis not present

## 2016-02-20 DIAGNOSIS — H5213 Myopia, bilateral: Secondary | ICD-10-CM | POA: Diagnosis not present

## 2016-02-20 DIAGNOSIS — H52223 Regular astigmatism, bilateral: Secondary | ICD-10-CM | POA: Diagnosis not present

## 2016-03-18 ENCOUNTER — Other Ambulatory Visit: Payer: Self-pay | Admitting: Family Medicine

## 2016-03-18 MED FILL — TRIAMTERENE/HCTZ 37.5/25 TB: 37.5-25 | 90 days supply | Qty: 90 | Fill #1

## 2016-03-18 MED FILL — traMADol HCL 50 MG TABS: 50 | 13 days supply | Qty: 40 | Fill #1

## 2016-03-18 MED FILL — traZODone HCL 50 MG TABS: 50 | 90 days supply | Qty: 90 | Fill #2

## 2016-03-21 MED FILL — ACYCLOVIR 400 MG TABLET: 400 | 90 days supply | Qty: 90 | Fill #0

## 2016-04-15 ENCOUNTER — Encounter: Payer: Self-pay | Admitting: Family Medicine

## 2016-04-15 ENCOUNTER — Other Ambulatory Visit: Payer: Self-pay | Admitting: Family Medicine

## 2016-04-15 MED FILL — hydrOXYzine HCL 50 MG TABS: 50 | 90 days supply | Qty: 270 | Fill #1

## 2016-04-18 ENCOUNTER — Other Ambulatory Visit: Payer: Self-pay

## 2016-04-18 MED ORDER — CYCLOBENZAPRINE HCL 10 MG PO TABS
10.0000 mg | ORAL_TABLET | Freq: Every day | ORAL | 1 refills | Status: DC
Start: 2016-04-18 — End: 2018-02-04

## 2016-04-18 MED FILL — CYCLOBENZAPRINE 10 MG TAB: 10 | 90 days supply | Qty: 90 | Fill #0

## 2016-06-08 ENCOUNTER — Ambulatory Visit: Payer: 59 | Admitting: Family Medicine

## 2016-08-09 ENCOUNTER — Encounter (HOSPITAL_COMMUNITY): Payer: Self-pay

## 2016-10-12 ENCOUNTER — Encounter: Payer: Self-pay | Admitting: Family Medicine

## 2016-10-12 ENCOUNTER — Other Ambulatory Visit: Payer: Self-pay

## 2016-10-12 MED ORDER — HYDROXYZINE HCL 50 MG PO TABS: ORAL_TABLET | ORAL | 0 refills | Status: DC

## 2016-10-12 MED ORDER — TRAZODONE HCL 50 MG PO TABS: 50.0000 mg | ORAL_TABLET | Freq: Every day | ORAL | 0 refills | Status: DC

## 2016-10-13 ENCOUNTER — Telehealth: Payer: Self-pay

## 2016-10-13 NOTE — Telephone Encounter (Signed)
error 

## 2016-11-16 ENCOUNTER — Other Ambulatory Visit: Payer: Self-pay | Admitting: Family Medicine

## 2017-02-15 ENCOUNTER — Other Ambulatory Visit: Payer: Self-pay | Admitting: Family Medicine

## 2017-05-08 ENCOUNTER — Other Ambulatory Visit: Payer: Self-pay

## 2017-05-08 MED ORDER — TRIAMTERENE-HCTZ 37.5-25 MG PO TABS: 1.0000 | ORAL_TABLET | Freq: Every day | ORAL | 0 refills | Status: DC

## 2017-05-08 NOTE — Telephone Encounter (Signed)
Seen 5 23 17   Pt needs an appt with dr Moshe Cipro

## 2017-05-09 ENCOUNTER — Encounter: Payer: Self-pay | Admitting: Family Medicine

## 2017-06-21 ENCOUNTER — Other Ambulatory Visit: Payer: Self-pay | Admitting: Family Medicine

## 2017-06-22 NOTE — Telephone Encounter (Signed)
Last seen 5 23 17   Given 1 month supply of atarax, needs to be seen by dr Moshe Cipro

## 2017-06-26 ENCOUNTER — Telehealth: Payer: Self-pay | Admitting: Family Medicine

## 2017-06-26 NOTE — Telephone Encounter (Signed)
Called patient to make f/u appt because I received a message to do so, patient states that she is still in school, she has no insurance, and will make an appt when she graduates may 2019

## 2017-08-17 ENCOUNTER — Other Ambulatory Visit: Payer: Self-pay | Admitting: Family Medicine

## 2017-09-07 ENCOUNTER — Encounter (HOSPITAL_COMMUNITY): Payer: Self-pay

## 2017-09-24 ENCOUNTER — Other Ambulatory Visit: Payer: Self-pay | Admitting: Family Medicine

## 2017-09-29 ENCOUNTER — Other Ambulatory Visit: Payer: Self-pay | Admitting: Family Medicine

## 2017-10-31 ENCOUNTER — Other Ambulatory Visit: Payer: Self-pay | Admitting: Family Medicine

## 2018-01-05 ENCOUNTER — Other Ambulatory Visit: Payer: Self-pay | Admitting: Family Medicine

## 2018-01-31 ENCOUNTER — Other Ambulatory Visit: Payer: Self-pay | Admitting: Family Medicine

## 2018-01-31 ENCOUNTER — Other Ambulatory Visit (HOSPITAL_COMMUNITY)
Admission: RE | Admit: 2018-01-31 | Discharge: 2018-01-31 | Disposition: A | Discharge status: Home / Self Care | Payer: Self-pay | Source: Ambulatory Visit | Attending: Family Medicine | Admitting: Family Medicine

## 2018-01-31 ENCOUNTER — Encounter: Payer: Self-pay | Admitting: Family Medicine

## 2018-01-31 ENCOUNTER — Ambulatory Visit (INDEPENDENT_AMBULATORY_CARE_PROVIDER_SITE_OTHER): Payer: Self-pay | Admitting: Family Medicine

## 2018-01-31 ENCOUNTER — Telehealth (INDEPENDENT_AMBULATORY_CARE_PROVIDER_SITE_OTHER): Payer: Self-pay

## 2018-01-31 VITALS — BP 122/84 | HR 74 | Resp 16 | Ht 68.0 in | Wt 206.0 lb

## 2018-01-31 DIAGNOSIS — F322 Major depressive disorder, single episode, severe without psychotic features: Secondary | ICD-10-CM

## 2018-01-31 DIAGNOSIS — N760 Acute vaginitis: Secondary | ICD-10-CM

## 2018-01-31 DIAGNOSIS — R252 Cramp and spasm: Secondary | ICD-10-CM | POA: Insufficient documentation

## 2018-01-31 DIAGNOSIS — Z202 Contact with and (suspected) exposure to infections with a predominantly sexual mode of transmission: Secondary | ICD-10-CM

## 2018-01-31 DIAGNOSIS — E785 Hyperlipidemia, unspecified: Secondary | ICD-10-CM | POA: Insufficient documentation

## 2018-01-31 DIAGNOSIS — E559 Vitamin D deficiency, unspecified: Secondary | ICD-10-CM | POA: Insufficient documentation

## 2018-01-31 DIAGNOSIS — E663 Overweight: Secondary | ICD-10-CM

## 2018-01-31 DIAGNOSIS — E8881 Metabolic syndrome: Secondary | ICD-10-CM

## 2018-01-31 DIAGNOSIS — E049 Nontoxic goiter, unspecified: Secondary | ICD-10-CM

## 2018-01-31 DIAGNOSIS — I10 Essential (primary) hypertension: Secondary | ICD-10-CM

## 2018-01-31 DIAGNOSIS — M62838 Other muscle spasm: Secondary | ICD-10-CM

## 2018-01-31 DIAGNOSIS — M25561 Pain in right knee: Secondary | ICD-10-CM

## 2018-01-31 LAB — LIPID PANEL
Cholesterol: 178 mg/dL (ref 0–200)
HDL: 56 mg/dL (ref >40)
LDL CALC: 113 mg/dL — AB (ref 0–99)
Total CHOL/HDL Ratio: 3.2 RATIO
Triglycerides: 43 mg/dL (ref <150)
VLDL: 9 mg/dL (ref 0–40)

## 2018-01-31 LAB — CBC
HCT: 34.1 % — ABNORMAL LOW (ref 36.0–46.0)
HEMOGLOBIN: 9.9 g/dL — AB (ref 12.0–15.0)
MCH: 19.9 pg — ABNORMAL LOW (ref 26.0–34.0)
MCHC: 29 g/dL — ABNORMAL LOW (ref 30.0–36.0)
MCV: 68.6 fL — ABNORMAL LOW (ref 78.0–100.0)
Platelets: 426 10*3/uL — ABNORMAL HIGH (ref 150–400)
RBC: 4.97 MIL/uL (ref 3.87–5.11)
RDW: 15.9 % — AB (ref 11.5–15.5)
WBC: 8.5 10*3/uL (ref 4.0–10.5)

## 2018-01-31 LAB — COMPREHENSIVE METABOLIC PANEL
ALT: 34 U/L (ref 0–44)
AST: 36 U/L (ref 15–41)
Albumin: 4 g/dL (ref 3.5–5.0)
Alkaline Phosphatase: 52 U/L (ref 38–126)
Anion gap: 10 (ref 5–15)
BUN: 13 mg/dL (ref 6–20)
CHLORIDE: 98 mmol/L (ref 98–111)
CO2: 27 mmol/L (ref 22–32)
Calcium: 9.3 mg/dL (ref 8.9–10.3)
Creatinine, Ser: 0.89 mg/dL (ref 0.44–1.00)
Glucose, Bld: 92 mg/dL (ref 70–99)
POTASSIUM: 3.9 mmol/L (ref 3.5–5.1)
SODIUM: 135 mmol/L (ref 135–145)
Total Bilirubin: 0.7 mg/dL (ref 0.3–1.2)
Total Protein: 8.2 g/dL — ABNORMAL HIGH (ref 6.5–8.1)

## 2018-01-31 LAB — TSH: TSH: 0.609 u[IU]/mL (ref 0.350–4.500)

## 2018-01-31 LAB — MAGNESIUM: MAGNESIUM: 2.1 mg/dL (ref 1.7–2.4)
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
## 2018-01-31 NOTE — Patient Instructions (Signed)
F/u appointment in 4 months, please call if you need me sooner  Urine to be sent for  STD testing today  You are referred to telepsychiatry today  Labs today HIV, RPR, lipid, cmp and eGFR CBC magnesium  TSH ,  and Vit D  Work excuse from 07/08 to return 02/05/2018  Please check into marriage counseling options in the community, [facility] may have some available  ( in [place])  Together you can make the situation better than it was before

## 2018-02-01 ENCOUNTER — Encounter: Payer: Self-pay | Admitting: Family Medicine

## 2018-02-01 ENCOUNTER — Telehealth: Payer: Self-pay

## 2018-02-01 DIAGNOSIS — D539 Nutritional anemia, unspecified: Secondary | ICD-10-CM

## 2018-02-01 LAB — HIV ANTIBODY (ROUTINE TESTING W REFLEX): HIV Screen 4th Generation wRfx: NONREACTIVE

## 2018-02-01 LAB — VITAMIN D 25 HYDROXY (VIT D DEFICIENCY, FRACTURES): VIT D 25 HYDROXY: 16.9 ng/mL — AB (ref 30.0–100.0)

## 2018-02-01 LAB — RPR: RPR: NONREACTIVE

## 2018-02-01 MED ORDER — HYDROXYZINE HCL 50 MG PO TABS: 150.0000 mg | ORAL_TABLET | Freq: Every day | ORAL | 0 refills | Status: DC

## 2018-02-01 NOTE — Telephone Encounter (Signed)
-----   Message from Fayrene Helper, MD sent at 02/01/2018  3:31 PM EDT ----- pls add iron, B1 and folate

## 2018-02-02 ENCOUNTER — Encounter: Payer: Self-pay | Admitting: Family Medicine

## 2018-02-02 LAB — URINE CYTOLOGY ANCILLARY ONLY
CHLAMYDIA, DNA PROBE: NEGATIVE
NEISSERIA GONORRHEA: NEGATIVE
TRICH (WINDOWPATH): NEGATIVE

## 2018-02-04 ENCOUNTER — Encounter: Payer: Self-pay | Admitting: Family Medicine

## 2018-02-04 DIAGNOSIS — M62838 Other muscle spasm: Secondary | ICD-10-CM | POA: Insufficient documentation

## 2018-02-04 DIAGNOSIS — F329 Major depressive disorder, single episode, unspecified: Secondary | ICD-10-CM | POA: Insufficient documentation

## 2018-02-04 DIAGNOSIS — F32A Depression, unspecified: Secondary | ICD-10-CM | POA: Insufficient documentation

## 2018-02-04 MED ORDER — TRAMADOL HCL 50 MG PO TABS: ORAL_TABLET | ORAL | 0 refills | Status: DC

## 2018-02-04 MED ORDER — CYCLOBENZAPRINE HCL 10 MG PO TABS: 10.0000 mg | ORAL_TABLET | Freq: Every day | ORAL | 5 refills | Status: DC
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
## 2018-02-04 NOTE — Assessment & Plan Note (Signed)
Controlled, no change in medication DASH diet and commitment to daily physical activity for a minimum of 30 minutes discussed and encouraged, as a part of hypertension management. The importance of attaining a healthy weight is also discussed.  BP/Weight 01/31/2018 12/15/2015 10/30/2015 10/07/2015 03/23/2015 02/24/2015 0/41/3643  Systolic BP 837 793 968 864 847 - 207  Diastolic BP 84 82 86 84 86 - 95  Wt. (Lbs) 206 195.12 - 197 237 247.5 -  BMI 31.32 29.67 - 29.96 37.11 37.64 -

## 2018-02-04 NOTE — Assessment & Plan Note (Signed)
C/o generalized muscle spasms, even inside of her intestines, no abnormality in blood to explain, bedtime flexeril is prescribed

## 2018-02-04 NOTE — Assessment & Plan Note (Signed)
Intermittent right knee pain , needs tramadol for relief at times,will send limited supply

## 2018-02-04 NOTE — Progress Notes (Signed)
Kristine Lawson     MRN: [id number]      DOB: 1970/10/15   HPI [name] is here with her husband , tearful and  depressed having found out 2 days prior that he had been having an affair with a Art therapist. She states she had been suspicious since 7 months prior , but she was busy with School and work and just never though he would do that to her. This is her 3rd marriage and  She is angry, humiliated, devastated, does not want to be around people, needs to ventilate and is seeking help She is not suicidal or homicidal. She has hd no direct physical contact with the female involved although she has seen her, she is concerned that her husband continues to work with her but also realizes that he does need to work, she has not spoken to the female involved either. She does not feel as though she can discuss this with a faimily member. She is interested in therapy and possibly medication, but is concerned abou possible s/e of low sex drive ROS Denies recent fever or chills. Denies sinus pressure, nasal congestion, ear pain or sore throat. Denies chest congestion, productive cough or wheezing. Denies chest pains, palpitations and leg swelling Denies abdominal pain, nausea, vomiting,diarrhea or constipation.   Denies dysuria, frequency, hesitancy or incontinence. . Denies skin break down or rash. C/o eternalized muscle spasms, even in her abdomen, also joint pains, especially her kne Wants thyroid checked, has a goiter which needs follow p but holding on insurance Wants testing for STD exposure  PE  BP 122/84   Pulse 74   Resp 16   Ht 5\' 8"  (1.727 m)   Wt 206 lb (93.4 kg)   SpO2 98%   BMI 31.32 kg/m   Patient alert and oriented and in no cardiopulmonary distress.Tearful crying depressed and anxious  HEENT: No facial asymmetry, EOMI,   oropharynx pink and moist.  Neck supple no JVD, goiter Chest: Clear to auscultation bilaterally.  CVS: S1, S2 no murmurs, no S3.Regular rate.  ABD:  Soft non tender.   Ext: No edema  MS: Adequate ROM spine, shoulders, hips and knees.  Skin: Intact, no ulcerations or rash noted.  Psych: Good eye contact, crying, depressed , anxious  CNS: CN 2-12 intact, power,  normal throughout.no focal deficits noted.   Assessment & Plan  Depression (emotion) Pt needs and will benefit from intense personal therapy as well as couples counseling which they both seem to be interested in. I believe that MS Punch will recover from this without a prolonged course of severe depression. She is completely taken aback because she entirely trusted her spouse and has been disappointed, this more than anything else is what has her so devastated. This is her third marriage , she has been hurt in the past and has a lot of resilience hope is that her husband is also extremely committed and capable of following through on the plan of openness and honesty moving forward She is out of work for this week, return next week, being out longer will be of no benefit. Medication is withheld at her request  Goiter follows up [place] to be scheduled once she calls   HYPERTENSION, BENIGN ESSENTIAL Controlled, no change in medication DASH diet and commitment to daily physical activity for a minimum of 30 minutes discussed and encouraged, as a part of hypertension management. The importance of attaining a healthy weight is also discussed.  BP/Weight 01/31/2018  12/15/2015 10/30/2015 10/07/2015 03/23/2015 02/24/2015 04/07/7828  Systolic BP 562 130 865 784 696 - 295  Diastolic BP 84 82 86 84 86 - 95  Wt. (Lbs) 206 195.12 - 197 237 247.5 -  BMI 31.32 29.67 - 29.96 37.11 37.64 -       Muscle spasm C/o generalized muscle spasms, even inside of her intestines, no abnormality in blood to explain, bedtime flexeril is prescribed  KNEE PAIN, RIGHT Intermittent right knee pain , needs tramadol for relief at times,will send limited supply  Overweight (BMI  25.0-29.9) Deteriorated. Patient re-educated about  the importance of commitment to a  minimum of 150 minutes of exercise per week.  The importance of healthy food choices with portion control discussed. Encouraged to start a food diary, count calories and to consider  joining a support group. Sample diet sheets offered. Goals set by the patient for the next several months.   Weight /BMI 01/31/2018 12/15/2015 10/07/2015  WEIGHT 206 lb 195 lb 1.9 oz 197 lb  HEIGHT 5\' 8"  5\' 8"  5\' 8"   BMI 31.32 kg/m2 29.67 kg/m2 29.96 kg/m2

## 2018-02-04 NOTE — Assessment & Plan Note (Signed)
Deteriorated. Patient re-educated about  the importance of commitment to a  minimum of 150 minutes of exercise per week.  The importance of healthy food choices with portion control discussed. Encouraged to start a food diary, count calories and to consider  joining a support group. Sample diet sheets offered. Goals set by the patient for the next several months.   Weight /BMI 01/31/2018 12/15/2015 10/07/2015  WEIGHT 206 lb 195 lb 1.9 oz 197 lb  HEIGHT 5\' 8"  5\' 8"  5\' 8"   BMI 31.32 kg/m2 29.67 kg/m2 29.96 kg/m2

## 2018-02-04 NOTE — Assessment & Plan Note (Signed)
follows up [place] to be scheduled once she calls

## 2018-02-05 LAB — URINE CYTOLOGY ANCILLARY ONLY
BACTERIAL VAGINITIS: NEGATIVE
Candida vaginitis: NEGATIVE

## 2018-02-07 NOTE — Progress Notes (Signed)
The patient is not interested in psychotropics at this time. Noted that the patient recently found infidelity. She would benefit from antidepressant if her symptoms does not resolve over time and/or becomes more intense. The patient wants to search therapist on her own before making any appointment. Will sign off. Please contact us if any concerns or questions.

## 2018-03-01 ENCOUNTER — Other Ambulatory Visit: Payer: Self-pay | Admitting: Family Medicine

## 2018-03-01 ENCOUNTER — Encounter: Payer: Self-pay | Admitting: Family Medicine

## 2018-03-01 ENCOUNTER — Other Ambulatory Visit: Payer: Self-pay

## 2018-03-01 MED ORDER — TRIAMTERENE-HCTZ 37.5-25 MG PO TABS: 1.0000 | ORAL_TABLET | Freq: Every day | ORAL | 1 refills | Status: DC

## 2018-03-01 MED ORDER — TRAMADOL HCL 50 MG PO TABS: ORAL_TABLET | ORAL | 3 refills | Status: DC

## 2018-03-03 ENCOUNTER — Other Ambulatory Visit: Payer: Self-pay | Admitting: Family Medicine

## 2018-03-05 ENCOUNTER — Encounter: Payer: Self-pay | Admitting: Family Medicine

## 2018-03-06 ENCOUNTER — Other Ambulatory Visit: Payer: Self-pay | Admitting: Family Medicine

## 2018-03-06 MED ORDER — NAPROXEN 500 MG PO TBEC: DELAYED_RELEASE_TABLET | ORAL | 3 refills | Status: DC

## 2018-04-16 ENCOUNTER — Other Ambulatory Visit: Payer: Self-pay | Admitting: Family Medicine

## 2018-04-20 ENCOUNTER — Encounter: Payer: Self-pay | Admitting: Family Medicine

## 2018-04-23 ENCOUNTER — Encounter: Payer: Self-pay | Admitting: Family Medicine

## 2018-04-23 ENCOUNTER — Other Ambulatory Visit: Payer: Self-pay | Admitting: Family Medicine

## 2018-04-23 MED ORDER — OLMESARTAN MEDOXOMIL-HCTZ 20-12.5 MG PO TABS: 1.0000 | ORAL_TABLET | Freq: Every day | ORAL | 1 refills | Status: DC

## 2018-04-23 NOTE — Progress Notes (Signed)
maxzide discontinued per pt request

## 2018-04-24 MED FILL — OLMESARTAN-HCTZ 20-12.5 MG: 20-12.5 | 90 days supply | Qty: 90 | Fill #0

## 2018-04-30 ENCOUNTER — Other Ambulatory Visit: Payer: Self-pay | Admitting: Family Medicine

## 2018-04-30 DIAGNOSIS — Z1231 Encounter for screening mammogram for malignant neoplasm of breast: Secondary | ICD-10-CM

## 2018-05-02 ENCOUNTER — Ambulatory Visit (HOSPITAL_COMMUNITY)
Admission: RE | Admit: 2018-05-02 | Discharge: 2018-05-02 | Disposition: A | Discharge status: Home / Self Care | Payer: 59 | Source: Ambulatory Visit | Attending: Family Medicine | Admitting: Family Medicine

## 2018-05-02 DIAGNOSIS — Z1231 Encounter for screening mammogram for malignant neoplasm of breast: Secondary | ICD-10-CM | POA: Insufficient documentation

## 2018-05-11 ENCOUNTER — Encounter: Payer: Self-pay | Admitting: Family Medicine

## 2018-05-14 ENCOUNTER — Telehealth: Payer: 59 | Admitting: Physician Assistant

## 2018-05-14 ENCOUNTER — Other Ambulatory Visit: Payer: Self-pay | Admitting: Family Medicine

## 2018-05-14 ENCOUNTER — Other Ambulatory Visit: Payer: Self-pay

## 2018-05-14 DIAGNOSIS — B379 Candidiasis, unspecified: Secondary | ICD-10-CM

## 2018-05-14 MED ORDER — TRAZODONE HCL 50 MG PO TABS: 50.0000 mg | ORAL_TABLET | Freq: Every day | ORAL | 1 refills | Status: DC

## 2018-05-14 MED ORDER — CYCLOBENZAPRINE HCL 10 MG PO TABS: 10.0000 mg | ORAL_TABLET | Freq: Every day | ORAL | 1 refills | Status: AC

## 2018-05-14 MED ORDER — OLMESARTAN MEDOXOMIL-HCTZ 20-12.5 MG PO TABS: 1.0000 | ORAL_TABLET | Freq: Every day | ORAL | 2 refills | Status: DC

## 2018-05-14 MED ORDER — TRAMADOL HCL 50 MG PO TABS: ORAL_TABLET | ORAL | 0 refills | Status: DC

## 2018-05-14 MED ORDER — ACYCLOVIR 400 MG PO TABS: 400.0000 mg | ORAL_TABLET | ORAL | 1 refills | Status: DC | PRN

## 2018-05-14 MED ORDER — FLUCONAZOLE 150 MG PO TABS: 150.0000 mg | ORAL_TABLET | Freq: Once | ORAL | 0 refills | Status: AC

## 2018-05-14 MED ORDER — HYDROXYZINE HCL 50 MG PO TABS: 150.0000 mg | ORAL_TABLET | Freq: Every day | ORAL | 1 refills | Status: DC

## 2018-05-14 MED ORDER — NAPROXEN 500 MG PO TBEC: DELAYED_RELEASE_TABLET | ORAL | 1 refills | Status: DC

## 2018-05-14 MED FILL — traMADol HCL 50 MG TABS: 50 | 90 days supply | Qty: 90 | Fill #0

## 2018-05-14 MED FILL — SHIPPING COST: 1 days supply | Qty: 1 | Fill #0

## 2018-05-14 MED FILL — ACYCLOVIR 400 MG TABLET: 400 | 90 days supply | Qty: 90 | Fill #0

## 2018-05-14 MED FILL — CYCLOBENZAPRINE HCL 10 MG T: 10 | 90 days supply | Qty: 90 | Fill #0

## 2018-05-14 MED FILL — traZODone HCL 50 MG TABS: 50 | 90 days supply | Qty: 90 | Fill #0

## 2018-05-14 NOTE — Progress Notes (Signed)

## 2018-05-14 NOTE — Progress Notes (Signed)
Tramadol 50  

## 2018-05-31 ENCOUNTER — Ambulatory Visit (INDEPENDENT_AMBULATORY_CARE_PROVIDER_SITE_OTHER): Payer: 59 | Admitting: Otolaryngology

## 2018-05-31 DIAGNOSIS — D440 Neoplasm of uncertain behavior of thyroid gland: Secondary | ICD-10-CM | POA: Diagnosis not present

## 2018-06-01 ENCOUNTER — Other Ambulatory Visit (INDEPENDENT_AMBULATORY_CARE_PROVIDER_SITE_OTHER): Payer: Self-pay | Admitting: Otolaryngology

## 2018-06-01 DIAGNOSIS — E041 Nontoxic single thyroid nodule: Secondary | ICD-10-CM

## 2018-06-03 ENCOUNTER — Encounter: Payer: Self-pay | Admitting: Family Medicine

## 2018-06-07 ENCOUNTER — Ambulatory Visit: Payer: Self-pay | Admitting: Family Medicine

## 2018-06-08 ENCOUNTER — Ambulatory Visit (HOSPITAL_COMMUNITY)
Admission: RE | Admit: 2018-06-08 | Discharge: 2018-06-08 | Disposition: A | Discharge status: Home / Self Care | Payer: 59 | Source: Ambulatory Visit | Attending: Otolaryngology | Admitting: Otolaryngology

## 2018-06-08 ENCOUNTER — Other Ambulatory Visit: Payer: Self-pay | Admitting: Family Medicine

## 2018-06-08 DIAGNOSIS — E041 Nontoxic single thyroid nodule: Secondary | ICD-10-CM | POA: Diagnosis present

## 2018-06-08 DIAGNOSIS — E042 Nontoxic multinodular goiter: Secondary | ICD-10-CM | POA: Diagnosis not present

## 2018-06-08 MED FILL — SHIPPING COST: 1 days supply | Qty: 1 | Fill #1

## 2018-06-08 MED FILL — hydrOXYzine HCL 50 MG TABS: 50 | 90 days supply | Qty: 270 | Fill #0

## 2018-07-16 ENCOUNTER — Encounter: Payer: Self-pay | Admitting: Family Medicine

## 2018-07-16 ENCOUNTER — Other Ambulatory Visit: Payer: Self-pay

## 2018-07-16 MED ORDER — NAPROXEN 500 MG PO TBEC: DELAYED_RELEASE_TABLET | ORAL | 1 refills | Status: DC

## 2018-07-24 MED FILL — OLMESARTAN-HCTZ 20-12.5 MG: 20-12.5 | 90 days supply | Qty: 90 | Fill #0

## 2018-07-24 MED FILL — NAPROXEN 500 MG TABLET: 500 | 90 days supply | Qty: 180 | Fill #0

## 2018-07-24 MED FILL — SHIPPING COST: 1 days supply | Qty: 1 | Fill #2

## 2018-08-22 ENCOUNTER — Ambulatory Visit (INDEPENDENT_AMBULATORY_CARE_PROVIDER_SITE_OTHER): Payer: 59 | Admitting: Family Medicine

## 2018-08-22 ENCOUNTER — Encounter: Payer: Self-pay | Admitting: Family Medicine

## 2018-08-22 VITALS — BP 112/64 | HR 82 | Resp 14 | Ht 68.0 in | Wt 221.1 lb

## 2018-08-22 DIAGNOSIS — I10 Essential (primary) hypertension: Secondary | ICD-10-CM

## 2018-08-22 DIAGNOSIS — F5104 Psychophysiologic insomnia: Secondary | ICD-10-CM | POA: Diagnosis not present

## 2018-08-22 DIAGNOSIS — E785 Hyperlipidemia, unspecified: Secondary | ICD-10-CM

## 2018-08-22 DIAGNOSIS — L700 Acne vulgaris: Secondary | ICD-10-CM

## 2018-08-22 DIAGNOSIS — R8290 Unspecified abnormal findings in urine: Secondary | ICD-10-CM | POA: Diagnosis not present

## 2018-08-22 DIAGNOSIS — Z1211 Encounter for screening for malignant neoplasm of colon: Secondary | ICD-10-CM | POA: Diagnosis not present

## 2018-08-22 DIAGNOSIS — E663 Overweight: Secondary | ICD-10-CM

## 2018-08-22 DIAGNOSIS — E559 Vitamin D deficiency, unspecified: Secondary | ICD-10-CM

## 2018-08-22 DIAGNOSIS — Z9884 Bariatric surgery status: Secondary | ICD-10-CM

## 2018-08-22 DIAGNOSIS — M62838 Other muscle spasm: Secondary | ICD-10-CM

## 2018-08-22 DIAGNOSIS — M25561 Pain in right knee: Secondary | ICD-10-CM

## 2018-08-22 LAB — POCT URINALYSIS DIP (CLINITEK)
Bilirubin, UA: NEGATIVE
Glucose, UA: NEGATIVE mg/dL
Ketones, POC UA: NEGATIVE mg/dL
LEUKOCYTES UA: NEGATIVE
Nitrite, UA: NEGATIVE
PH UA: 6.5 (ref 5.0–8.0)
POC PROTEIN,UA: NEGATIVE
RBC UA: NEGATIVE
Spec Grav, UA: 1.02 (ref 1.010–1.025)
UROBILINOGEN UA: 0.2 U/dL

## 2018-08-22 MED ORDER — CLINDAMYCIN PHOS-BENZOYL PEROX 1-5 % EX GEL: CUTANEOUS | 1 refills | Status: DC

## 2018-08-22 MED ORDER — MINOCYCLINE HCL 50 MG PO CAPS: 50.0000 mg | ORAL_CAPSULE | Freq: Two times a day (BID) | ORAL | 1 refills | Status: DC

## 2018-08-22 MED FILL — traZODone HCL 50 MG TABS: 50 | 90 days supply | Qty: 90 | Fill #1

## 2018-08-22 MED FILL — SHIPPING COST: 1 days supply | Qty: 1 | Fill #3

## 2018-08-22 MED FILL — MINOCYCLINE 50 MG CAPSULE: 50 | 30 days supply | Qty: 60 | Fill #0

## 2018-08-22 MED FILL — CLINDAMYCIN PHOS-BENZOYL PE: 1-5 | 30 days supply | Qty: 50 | Fill #0

## 2018-08-22 NOTE — Progress Notes (Signed)
KATHYE CIPRIANI     MRN: [id number]      DOB: 12/11/1970   HPI Ms. Hughett is here for follow up C/o facial acne  C/ anemia , needs colonoscopy C/o abnormal urine odor, wants to be checked for UTI  C/o leg cramps and generalized body cramps which are worsening with spasm worse at night, concerned as to underlying cause  C/o weight regain and is working on reversing this with more control with diet  ROS Denies recent fever or chills. Denies sinus pressure, nasal congestion, ear pain or sore throat. Denies chest congestion, productive cough or wheezing. Denies chest pains, palpitations and leg swelling Denies abdominal pain, nausea, vomiting,diarrhea or constipation.   Denies dysuria, frequency, hesitancy or incontinence.c/o abnormal urine odor Intermittent knee pain, responds to anti inflammatory  Denies headaches, seizures, numbness, or tingling. Denies depression, anxiety chronic  Insomnia.responds to medication    PE  BP 112/64   Pulse 82   Resp 14   Ht 5\' 8"  (1.727 m)   Wt 221 lb 1.3 oz (100.3 kg)   SpO2 99% Comment: room air  BMI 33.62 kg/m   Patient alert and oriented and in no cardiopulmonary distress.  HEENT: No facial asymmetry, EOMI,   oropharynx pink and moist.  Neck supple no JVD, no mass.  Chest: Clear to auscultation bilaterally.  CVS: S1, S2 no murmurs, no S3.Regular rate.  ABD: Soft non tender.   Ext: No edema  MS: Adequate ROM spine, shoulders, hips and reduced in  knees.  Skin: Intact, moderately severe facial acne witrh pittin Psych: Good eye contact, normal affect. Memory intact not anxious or depressed appearing.  CNS: CN 2-12 intact, power,  normal throughout.no focal deficits noted.   Assessment & Plan   HYPERTENSION, BENIGN ESSENTIAL Controlled, no change in medication DASH diet and commitment to daily physical activity for a minimum of 30 minutes discussed and encouraged, as a part of hypertension management. The importance  of attaining a healthy weight is also discussed.  BP/Weight 08/22/2018 01/31/2018 12/15/2015 10/30/2015 10/07/2015 5/00/9381 02/24/9936  Systolic BP 169 678 938 101 751 025 -  Diastolic BP 64 84 82 86 84 86 -  Wt. (Lbs) 221.08 206 195.12 - 197 237 247.5  BMI 33.62 31.32 29.67 - 29.96 37.11 37.64       Insomnia Sleep hygiene reviewed and written information offered also. Prescription sent for  medication needed.   Muscle spasm Need to check magnesium and calcium and potassium, may also  Be related to her anti hypertensive, after the visit , message sent to d/c the thiazide and increase dose o benicar  Overweight (BMI 25.0-29.9) Deteriorated.  Patient re-educated about  the importance of commitment to a  minimum of 150 minutes of exercise per week as able.  The importance of healthy food choices with portion control discussed, as well as eating regularly and within a 12 hour window most days. The need to choose "clean , green" food 50 to 75% of the time is discussed, as well as to make water the primary drink and set a goal of 64 ounces water daily.  Encouraged to start a food diary,  and to consider  joining a support group. Sample diet sheets offered. Goals set by the patient for the next several months.   Weight /BMI 08/22/2018 01/31/2018 12/15/2015  WEIGHT 221 lb 1.3 oz 206 lb 195 lb 1.9 oz  HEIGHT 5\' 8"  5\' 8"  5\' 8"   BMI 33.62 kg/m2 31.32 kg/m2 29.67 kg/m2  Dyslipidemia Hyperlipidemia:Low fat diet discussed and encouraged.   Lipid Panel  Lab Results  Component Value Date   CHOL 178 01/31/2018   HDL 56 01/31/2018   LDLCALC 113 (H) 01/31/2018   TRIG 43 01/31/2018   CHOLHDL 3.2 01/31/2018   Updated lab needed     Acne vulgaris Current flare and uncontrolled , start topical medication and antibiotic  KNEE PAIN, RIGHT Relieved with tramadol as needed, weight loss encouraged  Vitamin D deficiency Updated lab needed at/ before next visit.   Abnormal urine  odor CCUA normal, pt reassured no infection

## 2018-08-22 NOTE — Patient Instructions (Addendum)
F/U in 6 months, call if you need me before, weight loss goal of 10 pounds   Fasting lipid, cmp and EGFR, cBC  , iron, ferritin , B12 , CPK and magnesium, asap  You NEED colonoscopy and have been referred   Medication for acne is prescribed  Please get pap smear   It is important that you exercise regularly at least 30 minutes 5 times a week. If you develop chest pain, have severe difficulty breathing, or feel very tired, stop exercising immediately and seek medical attention

## 2018-08-27 ENCOUNTER — Other Ambulatory Visit: Payer: Self-pay | Admitting: Family Medicine

## 2018-08-27 ENCOUNTER — Encounter: Payer: Self-pay | Admitting: Family Medicine

## 2018-08-27 MED ORDER — OLMESARTAN MEDOXOMIL 40 MG PO TABS: 40.0000 mg | ORAL_TABLET | Freq: Every day | ORAL | 1 refills | Status: DC

## 2018-08-28 MED FILL — OLMESARTAN MEDOXOMIL 40 MG: 40 | 30 days supply | Qty: 30 | Fill #0

## 2018-08-28 MED FILL — SHIPPING COST: 1 days supply | Qty: 1 | Fill #4

## 2018-09-01 ENCOUNTER — Encounter: Payer: Self-pay | Admitting: Family Medicine

## 2018-09-01 DIAGNOSIS — R8290 Unspecified abnormal findings in urine: Secondary | ICD-10-CM | POA: Insufficient documentation

## 2018-09-01 NOTE — Assessment & Plan Note (Signed)
Controlled, no change in medication DASH diet and commitment to daily physical activity for a minimum of 30 minutes discussed and encouraged, as a part of hypertension management. The importance of attaining a healthy weight is also discussed.  BP/Weight 08/22/2018 01/31/2018 12/15/2015 10/30/2015 10/07/2015 7/58/3074 6/0/0298  Systolic BP 473 085 694 370 052 591 -  Diastolic BP 64 84 82 86 84 86 -  Wt. (Lbs) 221.08 206 195.12 - 197 237 247.5  BMI 33.62 31.32 29.67 - 29.96 37.11 37.64

## 2018-09-01 NOTE — Assessment & Plan Note (Signed)
Deteriorated.  Patient re-educated about  the importance of commitment to a  minimum of 150 minutes of exercise per week as able.  The importance of healthy food choices with portion control discussed, as well as eating regularly and within a 12 hour window most days. The need to choose "clean , green" food 50 to 75% of the time is discussed, as well as to make water the primary drink and set a goal of 64 ounces water daily.  Encouraged to start a food diary,  and to consider  joining a support group. Sample diet sheets offered. Goals set by the patient for the next several months.   Weight /BMI 08/22/2018 01/31/2018 12/15/2015  WEIGHT 221 lb 1.3 oz 206 lb 195 lb 1.9 oz  HEIGHT 5\' 8"  5\' 8"  5\' 8"   BMI 33.62 kg/m2 31.32 kg/m2 29.67 kg/m2

## 2018-09-01 NOTE — Assessment & Plan Note (Signed)
Sleep hygiene reviewed and written information offered also. Prescription sent for  medication needed.  

## 2018-09-01 NOTE — Assessment & Plan Note (Signed)
CCUA normal, pt reassured no infection

## 2018-09-01 NOTE — Assessment & Plan Note (Signed)
Need to check magnesium and calcium and potassium, may also  Be related to her anti hypertensive, after the visit , message sent to d/c the thiazide and increase dose o benicar

## 2018-09-01 NOTE — Assessment & Plan Note (Signed)
Hyperlipidemia:Low fat diet discussed and encouraged.   Lipid Panel  Lab Results  Component Value Date   CHOL 178 01/31/2018   HDL 56 01/31/2018   LDLCALC 113 (H) 01/31/2018   TRIG 43 01/31/2018   CHOLHDL 3.2 01/31/2018   Updated lab needed

## 2018-09-01 NOTE — Assessment & Plan Note (Signed)
Current flare and uncontrolled , start topical medication and antibiotic

## 2018-09-01 NOTE — Assessment & Plan Note (Signed)
Updated lab needed at/ before next visit.   

## 2018-09-01 NOTE — Assessment & Plan Note (Signed)
Relieved with tramadol as needed, weight loss encouraged

## 2018-09-19 DIAGNOSIS — I10 Essential (primary) hypertension: Secondary | ICD-10-CM | POA: Diagnosis not present

## 2018-09-19 DIAGNOSIS — Z9884 Bariatric surgery status: Secondary | ICD-10-CM | POA: Diagnosis not present

## 2018-09-19 DIAGNOSIS — E785 Hyperlipidemia, unspecified: Secondary | ICD-10-CM | POA: Diagnosis not present

## 2018-09-20 ENCOUNTER — Encounter: Payer: Self-pay | Admitting: Family Medicine

## 2018-09-20 LAB — COMPLETE METABOLIC PANEL WITH GFR
AG RATIO: 1.3 (calc) (ref 1.0–2.5)
ALKALINE PHOSPHATASE (APISO): 56 U/L (ref 31–125)
ALT: 24 U/L (ref 6–29)
AST: 27 U/L (ref 10–35)
Albumin: 3.9 g/dL (ref 3.6–5.1)
BILIRUBIN TOTAL: 0.3 mg/dL (ref 0.2–1.2)
BUN: 20 mg/dL (ref 7–25)
CO2: 27 mmol/L (ref 20–32)
Calcium: 8.9 mg/dL (ref 8.6–10.2)
Chloride: 104 mmol/L (ref 98–110)
Creat: 0.83 mg/dL (ref 0.50–1.10)
GFR, EST NON AFRICAN AMERICAN: 84 mL/min/{1.73_m2} (ref >=60)
GFR, Est African American: 97 mL/min/{1.73_m2} (ref >=60)
GLUCOSE: 79 mg/dL (ref 65–99)
Globulin: 3 g/dL (calc) (ref 1.9–3.7)
POTASSIUM: 3.8 mmol/L (ref 3.5–5.3)
SODIUM: 139 mmol/L (ref 135–146)
Total Protein: 6.9 g/dL (ref 6.1–8.1)

## 2018-09-20 LAB — CBC
HCT: 27.3 % — ABNORMAL LOW (ref 35.0–45.0)
Hemoglobin: 7.1 g/dL — ABNORMAL LOW (ref 11.7–15.5)
MCH: 15.2 pg — ABNORMAL LOW (ref 27.0–33.0)
MCHC: 26 g/dL — ABNORMAL LOW (ref 32.0–36.0)
MCV: 58.6 fL — ABNORMAL LOW (ref 80.0–100.0)
MPV: 8.3 fL (ref 7.5–12.5)
Platelets: 509 10*3/uL — ABNORMAL HIGH (ref 140–400)
RBC: 4.66 10*6/uL (ref 3.80–5.10)
RDW: 19 % — ABNORMAL HIGH (ref 11.0–15.0)
WBC: 4.5 10*3/uL (ref 3.8–10.8)

## 2018-09-20 LAB — LIPID PANEL
CHOL/HDL RATIO: 2.9 (calc) (ref <5.0)
Cholesterol: 150 mg/dL (ref <200)
HDL: 52 mg/dL (ref >=50)
LDL CHOLESTEROL (CALC): 87 mg/dL
NON-HDL CHOLESTEROL (CALC): 98 mg/dL (ref <130)
TRIGLYCERIDES: 40 mg/dL (ref <150)

## 2018-09-20 LAB — CK: Total CK: 68 U/L (ref 29–143)

## 2018-09-20 LAB — MAGNESIUM: Magnesium: 1.9 mg/dL (ref 1.5–2.5)

## 2018-09-20 LAB — FERRITIN: Ferritin: 4 ng/mL — ABNORMAL LOW (ref 16–232)

## 2018-09-20 LAB — IRON: Iron: 14 ug/dL — ABNORMAL LOW (ref 40–190)

## 2018-09-20 LAB — VITAMIN B12: Vitamin B-12: 1024 pg/mL (ref 200–1100)

## 2018-09-24 ENCOUNTER — Ambulatory Visit (INDEPENDENT_AMBULATORY_CARE_PROVIDER_SITE_OTHER): Payer: 59 | Admitting: Internal Medicine

## 2018-09-24 ENCOUNTER — Encounter (INDEPENDENT_AMBULATORY_CARE_PROVIDER_SITE_OTHER): Payer: Self-pay | Admitting: Internal Medicine

## 2018-09-24 ENCOUNTER — Encounter (INDEPENDENT_AMBULATORY_CARE_PROVIDER_SITE_OTHER): Payer: Self-pay | Admitting: *Deleted

## 2018-09-24 VITALS — BP 123/65 | HR 88 | Temp 98.4°F | Resp 18 | Ht 68.0 in | Wt 218.9 lb

## 2018-09-24 DIAGNOSIS — Z9884 Bariatric surgery status: Secondary | ICD-10-CM | POA: Diagnosis not present

## 2018-09-24 DIAGNOSIS — D509 Iron deficiency anemia, unspecified: Secondary | ICD-10-CM

## 2018-09-24 DIAGNOSIS — Z711 Person with feared health complaint in whom no diagnosis is made: Secondary | ICD-10-CM

## 2018-09-24 MED ORDER — FLINTSTONES PLUS IRON PO CHEW: 1.0000 | CHEWABLE_TABLET | Freq: Two times a day (BID) | ORAL | Status: DC

## 2018-09-24 MED ORDER — IBUPROFEN 400 MG PO TABS: 400.0000 mg | ORAL_TABLET | Freq: Two times a day (BID) | ORAL | 1 refills | Status: DC | PRN

## 2018-09-24 MED ORDER — PANTOPRAZOLE SODIUM 40 MG PO TBEC: 40.0000 mg | DELAYED_RELEASE_TABLET | Freq: Two times a day (BID) | ORAL | 2 refills | Status: DC

## 2018-09-24 MED FILL — IBUPROFEN 400 MG TABS: 400 | 30 days supply | Qty: 60 | Fill #0

## 2018-09-24 MED FILL — PANTOPRAZOLE SOD DR 40 MG T: 40 | 30 days supply | Qty: 60 | Fill #0

## 2018-09-24 MED FILL — SHIPPING COST: 1 days supply | Qty: 1 | Fill #5

## 2018-09-24 NOTE — Progress Notes (Signed)
Reason for consultation;  Iron deficiency anemia.  History of present illness:  Patient is 48 year old African-American female who is referred through courtesy of Dr. Tula Nakayama for GI evaluation. Patient was in usual state of health until about 2 months ago when she noted weakness postural lightheadedness and exertional dyspnea and the symptoms have been gradually getting worse.  She also has been experiencing cramping involving abdominal muscles as well as both legs toes and hips.  She noted the massage would alleviate abdominal cramping.  She saw Dr. Moshe Cipro last week.  She was noted to have a hemoglobin of 7.1 g.  MCV was low.  Because of cramping she also had magnesium and CPK levels checked and these were normal.  Patient states her hemoglobin was 9.9 in July 2019.  She says she has been given Hemoccult cards by Dr. Moshe Cipro but she never returned them. She has good appetite.  She states she has gained 10 pounds last year.  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  Presurgery weight was 259 pounds.  She states she got down to around 190 pounds but since then she has gained few pounds.  She still is eating 3 small meals a day.  She did have EGD at the time of surgery and was found to have a small sliding hiatal hernia but no evidence of peptic ulcer disease.  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  She denies hematuria or vaginal bleeding.  She also denies heartburn dysphagia or chest pain.   Current Medications: Outpatient Encounter Medications as of 09/24/2018  Medication Sig  . acyclovir (ZOVIRAX) 400 MG tablet Take 1 tablet (400 mg total) by mouth as needed.  . calcium gluconate 500 MG tablet Take 1 tablet by mouth 3 (three) times daily.   . clindamycin-benzoyl peroxide (BENZACLIN) gel Apply twice daily to face  . cyclobenzaprine (FLEXERIL) 10 MG tablet Take 1 tablet (10 mg total) by mouth at bedtime.  . hydrOXYzine (ATARAX/VISTARIL) 50 MG  tablet TAKE 3 TABLETS BY MOUTH AT BEDTIME  . minocycline (MINOCIN,DYNACIN) 50 MG capsule Take 1 capsule (50 mg total) by mouth 2 (two) times daily.  . Multiple Vitamin (MULTIVITAMIN) tablet Take 1 tablet by mouth 2 (two) times daily.  . naproxen (EC NAPROSYN) 500 MG EC tablet Take one tablet two times daily as needed, for joint pain and stiffness  . olmesartan-hydrochlorothiazide (BENICAR HCT) 20-12.5 MG tablet Take 1 tablet by mouth daily.  . traMADol (ULTRAM) 50 MG tablet Take one tablet once daily for uncontrolled knee pain, if needed  . traZODone (DESYREL) 50 MG tablet Take 1 tablet (50 mg total) by mouth at bedtime.  . Vitamin D, Ergocalciferol, (DRISDOL) 50000 units CAPS capsule TAKE 1 CAPSULE BY MOUTH EVERY 7 DAYS.  Marland Kitchen olmesartan (BENICAR) 40 MG tablet Take 1 tablet (40 mg total) by mouth daily. (Patient not taking: Reported on 09/24/2018)   No facility-administered encounter medications on file as of 09/24/2018.    Past medical history:  Past Medical History:  Diagnosis Date  . Allergy   .  Osteoarthrosis of both knees.   . Hypertension   . Insomnia    meds needed daily to achieve 6+ hours sleep.        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  She has never been evaluated by a dermatologist.       Vitamin D deficiency.         Acne.  Fever blisters/aphthous oral ulcers.      Past Surgical History:  Procedure Laterality Date  . ABLATION     uterine ablation  . APPENDECTOMY at age 71.    .        . CHOLECYSTECTOMY    . LAPAROSCOPIC ROUX-EN-Y GASTRIC BYPASS WITH HIATAL HERNIA REPAIR N/A 02/10/2015   Procedure: LAPAROSCOPIC ROUX-EN-Y GASTRIC BYPASS ;  Surgeon: Greer Pickerel, MD;  Location: [facility];  Service: General;  Laterality: N/A;  . TUBAL LIGATION    . UPPER GI ENDOSCOPY N/A 02/10/2015   Procedure: UPPER GI ENDOSCOPY;  Surgeon: Greer Pickerel, MD;  Location: [facility];  Service: General;  Laterality: N/A;    Allergies:  Allergies  Allergen Reactions  . Lisinopril Cough  .  Aspirin Hives    Goody Powder-  . Triamterene Other (See Comments)    Cramps     Family history:  Father died of MI at age 63.  Mother died at age 32.  She had end-stage renal disease and found to have abdominal mass and died of sepsis before definite diagnosis could be made. She has a brother with type 2 diabetes and hypertension in her sister in good health.  Social history:  She is married.  She has 3 biologic children ages 2928 and 33.  Middle child is a son and others to her daughters.  She has 1 stepchild age 48.  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  She smoked cigarettes for total of 3 years but does not smoke anymore.  She does not drink alcohol.  She has been going to the gym 2-3 times a week until few weeks ago when she developed symptoms of anemia.  Physical examination:  Blood pressure 123/65, pulse 88, temperature 98.4 F (36.9 C), temperature source Rectal, resp. rate 18, height 5' 8"  (1.727 m), weight 218 lb 14.4 oz (99.3 kg). Patient is alert and in no acute distress. Conjunctiva is pale. Sclera is nonicteric Oropharyngeal mucosa is normal. No neck masses or thyromegaly noted. Cardiac exam with regular rhythm normal S1 and S2. No murmur or gallop noted. Lungs are clear to auscultation. Abdomen is symmetrical.  Bowel sounds are normal.  Laparoscopy scars noted.  She also has a long scar in right lower quadrant from previous appendectomy.  On palpation abdomen is soft and nontender with organomegaly or masses. Rectal examination deferred. No LE edema or clubbing noted.  Labs/studies Results:  CBC Latest Ref Rng & Units 09/19/2018 01/31/2018 01/04/2016  WBC 3.8 - 10.8 Thousand/uL 4.5 8.5 5.4  Hemoglobin 11.7 - 15.5 g/dL 7.1(L) 9.9(L) 12.3  Hematocrit 35.0 - 45.0 % 27.3(L) 34.1(L) 37.6  Platelets 140 - 400 Thousand/uL 509(H) 426(H) 349    CMP Latest Ref  Rng & Units 09/19/2018 01/31/2018 12/15/2015  Glucose 65 - 99 mg/dL 79 92 88  BUN 7 - 25 mg/dL 20 13 14   Creatinine 0.50 - 1.10 mg/dL 0.83 0.89 0.90  Sodium 135 - 146 mmol/L 139 135 141  Potassium 3.5 - 5.3 mmol/L 3.8 3.9 4.2  Chloride 98 - 110 mmol/L 104 98 103  CO2 20 - 32 mmol/L 27 27 29   Calcium 8.6 - 10.2 mg/dL 8.9 9.3 9.5  Total Protein 6.1 - 8.1 g/dL 6.9 8.2(H) 7.5  Total Bilirubin 0.2 - 1.2 mg/dL 0.3 0.7 0.5  Alkaline Phos 38 - 126 U/L - 52 61  AST 10 - 35 U/L 27 36 18  ALT 6 - 29 U/L 24 34 16    Hepatic Function Latest Ref Rng & Units 09/19/2018 01/31/2018 12/15/2015  Total Protein 6.1 - 8.1 g/dL 6.9 8.2(H) 7.5  Albumin 3.5 - 5.0 g/dL - 4.0 4.2  AST 10 - 35 U/L 27 36 18  ALT 6 - 29 U/L 24 34 16  Alk Phosphatase 38 - 126 U/L - 52 61  Total Bilirubin 0.2 - 1.2 mg/dL 0.3 0.7 0.5    Serum iron 14 and ferritin 4 on 09/19/2018.  Assessment:  #1.  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 Moshe Cipro with progressive weakness exertional dyspnea and postural symptoms.  She is on Naprosyn every day.  No history of melena or rectal bleeding.  No prior history of peptic ulcer disease. Differential diagnosis includes NSAID induced peptic ulcer disease or small or large bowel injury due to NSAID and she possibly also have impaired iron absorption secondary to bariatric surgery.   Recommendations:  Hemoccult x1. Discontinue Naprosyn. Use tramadol on as needed for pain. Can take OTC ibuprofen 400 mg by mouth twice daily on as-needed basis. Iron infusion x2 doses.  First dose as soon as possible. [place] chewable with iron 1 tablet twice daily. Pantoprazole 40 mg by mouth twice daily. Diagnostic esophagogastroduodenoscopy next week. Further recommendations to follow.

## 2018-09-24 NOTE — Patient Instructions (Signed)
Hemoccult x1  Do not take Naprosyn anymore. Can take tramadol for pain as needed. Take ibuprofen OTC 400  mg up to twice a day only if joint pain not controlled with tramadol. Iron infusion to be scheduled Esophagogastroduodenoscopy to be scheduled

## 2018-09-26 ENCOUNTER — Encounter (HOSPITAL_COMMUNITY): Payer: Self-pay

## 2018-09-26 ENCOUNTER — Other Ambulatory Visit: Payer: Self-pay

## 2018-09-26 ENCOUNTER — Encounter (HOSPITAL_COMMUNITY)
Admission: RE | Admit: 2018-09-26 | Discharge: 2018-09-26 | Disposition: A | Discharge status: Home / Self Care | Payer: 59 | Source: Ambulatory Visit | Attending: Internal Medicine | Admitting: Internal Medicine

## 2018-09-26 DIAGNOSIS — D509 Iron deficiency anemia, unspecified: Secondary | ICD-10-CM | POA: Insufficient documentation

## 2018-09-26 MED ORDER — SODIUM CHLORIDE 0.9 % IV SOLN
Freq: Once | INTRAVENOUS | Status: AC
  Administered 2018-09-26: 12:00:00 via INTRAVENOUS

## 2018-09-26 MED ORDER — SODIUM CHLORIDE 0.9 % IV SOLN
510.0000 mg | Freq: Once | INTRAVENOUS | Status: AC
  Administered 2018-09-26: 510 mg via INTRAVENOUS
  Filled 2018-09-26: qty 17

## 2018-09-26 NOTE — Discharge Instructions (Signed)

## 2018-09-26 NOTE — Progress Notes (Signed)
Patient received dose 1/2 of Feraheme.  Tolerated well.  Written material given to patient and verbalizes understanding.

## 2018-09-28 ENCOUNTER — Telehealth (INDEPENDENT_AMBULATORY_CARE_PROVIDER_SITE_OTHER): Payer: Self-pay | Admitting: *Deleted

## 2018-09-28 NOTE — Telephone Encounter (Signed)
   Diagnosis:    Result(s)   Card 1: Negative:            Completed by: Thomas Hoff , LPN   HEMOCCULT SENSA DEVELOPER: LOT#:  [id number] EXPIRATION DATE: 2021-11   HEMOCCULT SENSA CARD:  LOT#:  [id number] EXPIRATION DATE: 05/21   CARD CONTROL RESULTS:  POSITIVE: Positive NEGATIVE: Negative    ADDITIONAL COMMENTS: Patient was called and given result. Forwarded to [place] for  Review.

## 2018-09-30 NOTE — Telephone Encounter (Signed)
Stool is guaiac negative. 

## 2018-10-01 ENCOUNTER — Encounter (HOSPITAL_COMMUNITY): Payer: Self-pay

## 2018-10-01 ENCOUNTER — Other Ambulatory Visit: Payer: Self-pay

## 2018-10-01 ENCOUNTER — Other Ambulatory Visit (INDEPENDENT_AMBULATORY_CARE_PROVIDER_SITE_OTHER): Payer: Self-pay | Admitting: *Deleted

## 2018-10-01 ENCOUNTER — Ambulatory Visit (HOSPITAL_COMMUNITY)
Admission: RE | Admit: 2018-10-01 | Discharge: 2018-10-01 | Disposition: A | Discharge status: Home / Self Care | Payer: 59 | Source: Ambulatory Visit | Attending: Internal Medicine | Admitting: Internal Medicine

## 2018-10-01 ENCOUNTER — Encounter (INDEPENDENT_AMBULATORY_CARE_PROVIDER_SITE_OTHER): Payer: Self-pay

## 2018-10-01 ENCOUNTER — Encounter (HOSPITAL_COMMUNITY)
Admission: RE | Disposition: A | Discharge status: Home / Self Care | Payer: Self-pay | Source: Ambulatory Visit | Attending: Internal Medicine

## 2018-10-01 DIAGNOSIS — K259 Gastric ulcer, unspecified as acute or chronic, without hemorrhage or perforation: Secondary | ICD-10-CM | POA: Insufficient documentation

## 2018-10-01 DIAGNOSIS — Z8249 Family history of ischemic heart disease and other diseases of the circulatory system: Secondary | ICD-10-CM | POA: Diagnosis not present

## 2018-10-01 DIAGNOSIS — Z888 Allergy status to other drugs, medicaments and biological substances status: Secondary | ICD-10-CM | POA: Insufficient documentation

## 2018-10-01 DIAGNOSIS — K289 Gastrojejunal ulcer, unspecified as acute or chronic, without hemorrhage or perforation: Secondary | ICD-10-CM

## 2018-10-01 DIAGNOSIS — K279 Peptic ulcer, site unspecified, unspecified as acute or chronic, without hemorrhage or perforation: Secondary | ICD-10-CM

## 2018-10-01 DIAGNOSIS — D509 Iron deficiency anemia, unspecified: Secondary | ICD-10-CM | POA: Diagnosis not present

## 2018-10-01 DIAGNOSIS — T50905A Adverse effect of unspecified drugs, medicaments and biological substances, initial encounter: Principal | ICD-10-CM

## 2018-10-01 DIAGNOSIS — Z833 Family history of diabetes mellitus: Secondary | ICD-10-CM | POA: Diagnosis not present

## 2018-10-01 DIAGNOSIS — I10 Essential (primary) hypertension: Secondary | ICD-10-CM | POA: Diagnosis not present

## 2018-10-01 DIAGNOSIS — Z79899 Other long term (current) drug therapy: Secondary | ICD-10-CM | POA: Insufficient documentation

## 2018-10-01 DIAGNOSIS — Z9884 Bariatric surgery status: Secondary | ICD-10-CM | POA: Diagnosis not present

## 2018-10-01 DIAGNOSIS — Z886 Allergy status to analgesic agent status: Secondary | ICD-10-CM | POA: Insufficient documentation

## 2018-10-01 DIAGNOSIS — Z9049 Acquired absence of other specified parts of digestive tract: Secondary | ICD-10-CM | POA: Insufficient documentation

## 2018-10-01 DIAGNOSIS — G4700 Insomnia, unspecified: Secondary | ICD-10-CM | POA: Insufficient documentation

## 2018-10-01 DIAGNOSIS — Z980 Intestinal bypass and anastomosis status: Secondary | ICD-10-CM | POA: Insufficient documentation

## 2018-10-01 DIAGNOSIS — Z791 Long term (current) use of non-steroidal anti-inflammatories (NSAID): Secondary | ICD-10-CM | POA: Diagnosis not present

## 2018-10-01 DIAGNOSIS — M170 Bilateral primary osteoarthritis of knee: Secondary | ICD-10-CM | POA: Diagnosis not present

## 2018-10-01 DIAGNOSIS — L299 Pruritus, unspecified: Secondary | ICD-10-CM | POA: Insufficient documentation

## 2018-10-01 HISTORY — PX: ESOPHAGOGASTRODUODENOSCOPY: SHX5428

## 2018-10-01 SURGERY — EGD (ESOPHAGOGASTRODUODENOSCOPY)
Anesthesia: Moderate Sedation

## 2018-10-01 MED ORDER — MIDAZOLAM HCL 5 MG/5ML IJ SOLN
INTRAMUSCULAR | Status: DC | PRN
  Administered 2018-10-01: 1 mg via INTRAVENOUS
  Administered 2018-10-01 (×2): 2 mg via INTRAVENOUS

## 2018-10-01 MED ORDER — MEPERIDINE HCL 50 MG/ML IJ SOLN
INTRAMUSCULAR | Status: DC | PRN
  Administered 2018-10-01 (×2): 25 mg

## 2018-10-01 MED ORDER — LIDOCAINE VISCOUS HCL 2 % MT SOLN
OROMUCOSAL | Status: DC | PRN
  Administered 2018-10-01: 1 via OROMUCOSAL

## 2018-10-01 MED ORDER — STERILE WATER FOR IRRIGATION IR SOLN
Status: DC | PRN
  Administered 2018-10-01: 08:00:00

## 2018-10-01 MED ORDER — MEPERIDINE HCL 100 MG/ML IJ SOLN
INTRAMUSCULAR | Status: AC
  Filled 2018-10-01: qty 1

## 2018-10-01 MED ORDER — LIDOCAINE VISCOUS HCL 2 % MT SOLN
OROMUCOSAL | Status: AC
  Filled 2018-10-01: qty 15

## 2018-10-01 MED ORDER — SODIUM CHLORIDE 0.9 % IV SOLN: INTRAVENOUS | Status: DC

## 2018-10-01 MED ORDER — MIDAZOLAM HCL 5 MG/5ML IJ SOLN
INTRAMUSCULAR | Status: AC
  Filled 2018-10-01: qty 10

## 2018-10-01 NOTE — Op Note (Signed)
[facility] Patient Name: Kristine Lawson Procedure Date: 10/01/2018 8:01 AM MRN: [id number] Date of Birth: September 03, 1970 Attending MD: Hildred Laser , MD CSN: [id number] Age: 48 Admit Type: Outpatient Procedure:                Upper GI endoscopy Indications:              Iron deficiency anemia Providers:                Hildred Laser, MD, Janeece Riggers, RN, Aram Candela Referring MD:             Norwood Levo. Moshe Cipro, MD Medicines:                Lidocaine spray, Meperidine 50 mg IV, Midazolam 5                            mg IV Complications:            No immediate complications. Estimated Blood Loss:     Estimated blood loss: none. Procedure:                Pre-Anesthesia Assessment:                           - Prior to the procedure, a History and Physical                            was performed, and patient medications and                            allergies were reviewed. The patient's tolerance of                            previous anesthesia was also reviewed. The risks                            and benefits of the procedure and the sedation                            options and risks were discussed with the patient.                            All questions were answered, and informed consent                            was obtained. Prior Anticoagulants: The patient                            last took ibuprofen 1 day prior to the procedure.                            ASA Grade Assessment: II - A patient with mild                            systemic disease. After reviewing the risks and  benefits, the patient was deemed in satisfactory                            condition to undergo the procedure.                           After obtaining informed consent, the endoscope was                            passed under direct vision. Throughout the                            procedure, the patient's blood pressure, pulse, and                             oxygen saturations were monitored continuously. The                            GIF-H190 ([id number]) scope was introduced through the                            mouth, and advanced to the proximal jejunum. The                            upper GI endoscopy was accomplished without                            difficulty. The patient tolerated the procedure                            well. Scope In: 8:30:26 AM Scope Out: 8:35:00 AM Total Procedure Duration: 0 hours 4 minutes 34 seconds  Findings:      The examined esophagus was normal.      The Z-line was regular and was found 40 cm from the incisors.      Evidence of a Roux-en-Y gastrojejunostomy was found. Very small gastric       pouch. The gastrojejunal anastomosis was characterized by ulceration.       This was traversed.      The examined jejunum was normal. Impression:               - Normal esophagus.                           - Z-line regular, 40 cm from the incisors.                           - Roux-en-Y gastrojejunostomy with gastrojejunal                            anastomosis characterized by ulceration.                           - Normal examined jejunum.                           -  No specimens collected. Moderate Sedation:      Moderate (conscious) sedation was administered by the endoscopy nurse       and supervised by the endoscopist. The following parameters were       monitored: oxygen saturation, heart rate, blood pressure, CO2       capnography and response to care. Total physician intraservice time was       10 minutes. Recommendation:           - Patient has a contact number available for                            emergencies. The signs and symptoms of potential                            delayed complications were discussed with the                            patient. Return to normal activities tomorrow.                            Written discharge instructions were provided to the                             patient.                           - Resume previous diet today.                           - Continue present medications.                           - No aspirin, ibuprofen, naproxen, or other                            non-steroidal anti-inflammatory drugs.                           - Perform an H. pylori serology today.                           - Repeat upper endoscopy in 12 weeks to check                            healing. Procedure Code(s):        --- Professional ---                           [phone], Esophagogastroduodenoscopy, flexible,                            transoral; diagnostic, including collection of                            specimen(s) by brushing or washing, when performed                            (  separate procedure)                           G0500, Moderate sedation services provided by the                            same physician or other qualified health care                            professional performing a gastrointestinal                            endoscopic service that sedation supports,                            requiring the presence of an independent trained                            observer to assist in the monitoring of the                            patient's level of consciousness and physiological                            status; initial 15 minutes of intra-service time;                            patient age 80 years or older (additional time may                            be reported with [phone], as appropriate) Diagnosis Code(s):        --- Professional ---                           Z98.0, Intestinal bypass and anastomosis status                           D50.9, Iron deficiency anemia, unspecified CPT copyright 2018 American Medical Association. All rights reserved. The codes documented in this report are preliminary and upon coder review may  be revised to meet current compliance requirements. Hildred Laser, MD Hildred Laser,  MD 10/01/2018 8:45:26 AM This report has been signed electronically. Number of Addenda: 0

## 2018-10-01 NOTE — Discharge Instructions (Signed)
Resume usual medications as before. Keep ibuprofen use to minimum or not take it at all. Resume usual diet. No driving for 24 hours. Physician will call with results of blood test. Repeat EGD in 12 weeks to document healing of this ulcer.   Upper Endoscopy, Adult, Care After This sheet gives you information about how to care for yourself after your procedure. Your health care provider may also give you more specific instructions. If you have problems or questions, contact your health care provider. What can I expect after the procedure? After the procedure, it is common to have:  A sore throat.  Mild stomach pain or discomfort.  Bloating.  Nausea. Follow these instructions at home:   Follow instructions from your health care provider about what to eat or drink after your procedure.  Return to your normal activities as told by your health care provider. Ask your health care provider what activities are safe for you.  Take over-the-counter and prescription medicines only as told by your health care provider.  Do not drive for 24 hours if you were given a sedative during your procedure.  Keep all follow-up visits as told by your health care provider. This is important. Contact a health care provider if you have:  A sore throat that lasts longer than one day.  Trouble swallowing. Get help right away if:  You vomit blood or your vomit looks like coffee grounds.  You have: ? A fever. ? Bloody, black, or tarry stools. ? A severe sore throat or you cannot swallow. ? Difficulty breathing. ? Severe pain in your chest or abdomen. Summary  After the procedure, it is common to have a sore throat, mild stomach discomfort, bloating, and nausea.  Do not drive for 24 hours if you were given a sedative during the procedure.  Follow instructions from your health care provider about what to eat or drink after your procedure.  Return to your normal activities as told by your health  care provider. This information is not intended to replace advice given to you by your health care provider. Make sure you discuss any questions you have with your health care provider. Document Released: 01/10/2012 Document Revised: 12/11/2017 Document Reviewed: 12/11/2017 Elsevier Interactive Patient Education  2019 [employer].

## 2018-10-02 LAB — H. PYLORI ANTIBODY, IGG: H Pylori IgG: 0.46 Index Value (ref 0.00–0.79)

## 2018-10-03 ENCOUNTER — Encounter (HOSPITAL_COMMUNITY): Payer: Self-pay

## 2018-10-03 ENCOUNTER — Other Ambulatory Visit: Payer: Self-pay

## 2018-10-03 ENCOUNTER — Encounter: Payer: Self-pay | Admitting: Family Medicine

## 2018-10-03 ENCOUNTER — Encounter (HOSPITAL_COMMUNITY)
Admission: RE | Admit: 2018-10-03 | Discharge: 2018-10-03 | Disposition: A | Discharge status: Home / Self Care | Payer: 59 | Source: Ambulatory Visit | Attending: Internal Medicine | Admitting: Internal Medicine

## 2018-10-03 ENCOUNTER — Encounter (INDEPENDENT_AMBULATORY_CARE_PROVIDER_SITE_OTHER): Payer: Self-pay | Admitting: *Deleted

## 2018-10-03 DIAGNOSIS — D509 Iron deficiency anemia, unspecified: Secondary | ICD-10-CM | POA: Diagnosis not present

## 2018-10-03 LAB — HEMOGLOBIN AND HEMATOCRIT, BLOOD
HCT: 30.4 % — ABNORMAL LOW (ref 36.0–46.0)
Hemoglobin: 7.9 g/dL — ABNORMAL LOW (ref 12.0–15.0)

## 2018-10-03 MED ORDER — OLMESARTAN MEDOXOMIL-HCTZ 20-12.5 MG PO TABS: 1.0000 | ORAL_TABLET | Freq: Every day | ORAL | 2 refills | Status: DC

## 2018-10-03 MED ORDER — SODIUM CHLORIDE 0.9 % IV SOLN
Freq: Once | INTRAVENOUS | Status: AC
  Administered 2018-10-03: 12:00:00 via INTRAVENOUS

## 2018-10-03 MED ORDER — SODIUM CHLORIDE 0.9 % IV SOLN
510.0000 mg | Freq: Once | INTRAVENOUS | Status: AC
  Administered 2018-10-03: 510 mg via INTRAVENOUS
  Filled 2018-10-03: qty 17

## 2018-10-04 ENCOUNTER — Encounter (INDEPENDENT_AMBULATORY_CARE_PROVIDER_SITE_OTHER): Payer: Self-pay

## 2018-10-04 ENCOUNTER — Encounter (HOSPITAL_COMMUNITY): Payer: Self-pay | Admitting: Internal Medicine

## 2018-10-04 MED FILL — MINOCYCLINE 50 MG CAPSULE: 50 | 30 days supply | Qty: 60 | Fill #1

## 2018-10-04 MED FILL — SHIPPING COST: 1 days supply | Qty: 1 | Fill #6

## 2018-10-04 MED FILL — OLMESARTAN-HCTZ 20-12.5 MG: 20-12.5 | 90 days supply | Qty: 90 | Fill #0

## 2018-10-05 ENCOUNTER — Other Ambulatory Visit (INDEPENDENT_AMBULATORY_CARE_PROVIDER_SITE_OTHER): Payer: Self-pay | Admitting: *Deleted

## 2018-10-05 ENCOUNTER — Encounter (INDEPENDENT_AMBULATORY_CARE_PROVIDER_SITE_OTHER): Payer: Self-pay | Admitting: *Deleted

## 2018-10-05 DIAGNOSIS — D509 Iron deficiency anemia, unspecified: Secondary | ICD-10-CM

## 2018-10-11 ENCOUNTER — Other Ambulatory Visit: Payer: Self-pay | Admitting: Family Medicine

## 2018-10-11 MED FILL — PANTOPRAZOLE SOD DR 40 MG T: 40 | 60 days supply | Qty: 120 | Fill #1

## 2018-10-11 MED FILL — hydrOXYzine HCL 50 MG TABS: 50 | 90 days supply | Qty: 270 | Fill #1

## 2018-10-11 MED FILL — IBUPROFEN 400 MG TABS: 400 | 30 days supply | Qty: 60 | Fill #1

## 2018-10-11 MED FILL — ACYCLOVIR 400 MG TABLET: 400 | 90 days supply | Qty: 90 | Fill #1

## 2018-10-11 MED FILL — CYCLOBENZAPRINE HCL 10 MG T: 10 | 90 days supply | Qty: 90 | Fill #1

## 2018-10-15 ENCOUNTER — Ambulatory Visit (INDEPENDENT_AMBULATORY_CARE_PROVIDER_SITE_OTHER): Payer: 59 | Admitting: Internal Medicine

## 2018-10-22 ENCOUNTER — Encounter (INDEPENDENT_AMBULATORY_CARE_PROVIDER_SITE_OTHER): Payer: Self-pay

## 2018-10-26 ENCOUNTER — Encounter (INDEPENDENT_AMBULATORY_CARE_PROVIDER_SITE_OTHER): Payer: Self-pay

## 2018-10-26 DIAGNOSIS — D509 Iron deficiency anemia, unspecified: Secondary | ICD-10-CM | POA: Diagnosis not present

## 2018-10-26 LAB — HEMOGLOBIN AND HEMATOCRIT, BLOOD
HCT: 37.2 % (ref 35.0–45.0)
Hemoglobin: 11.4 g/dL — ABNORMAL LOW (ref 11.7–15.5)

## 2018-10-29 ENCOUNTER — Other Ambulatory Visit (INDEPENDENT_AMBULATORY_CARE_PROVIDER_SITE_OTHER): Payer: Self-pay | Admitting: *Deleted

## 2018-10-29 DIAGNOSIS — D649 Anemia, unspecified: Secondary | ICD-10-CM

## 2018-11-14 ENCOUNTER — Encounter (INDEPENDENT_AMBULATORY_CARE_PROVIDER_SITE_OTHER): Payer: Self-pay | Admitting: *Deleted

## 2018-11-14 ENCOUNTER — Other Ambulatory Visit (INDEPENDENT_AMBULATORY_CARE_PROVIDER_SITE_OTHER): Payer: Self-pay | Admitting: *Deleted

## 2018-11-14 DIAGNOSIS — D649 Anemia, unspecified: Secondary | ICD-10-CM

## 2018-11-28 ENCOUNTER — Other Ambulatory Visit: Payer: Self-pay | Admitting: Family Medicine

## 2018-11-28 MED FILL — traMADol HCL 50 MG TABS: 50 | 90 days supply | Qty: 90 | Fill #0

## 2018-12-03 DIAGNOSIS — D649 Anemia, unspecified: Secondary | ICD-10-CM | POA: Diagnosis not present

## 2018-12-03 LAB — CBC
HCT: 39.8 % (ref 35.0–45.0)
Hemoglobin: 12.8 g/dL (ref 11.7–15.5)
MCH: 25.4 pg — ABNORMAL LOW (ref 27.0–33.0)
MCHC: 32.2 g/dL (ref 32.0–36.0)
MCV: 79.1 fL — ABNORMAL LOW (ref 80.0–100.0)
MPV: 9.1 fL (ref 7.5–12.5)
Platelets: 376 10*3/uL (ref 140–400)
RBC: 5.03 10*6/uL (ref 3.80–5.10)
WBC: 5.2 10*3/uL (ref 3.8–10.8)

## 2018-12-18 ENCOUNTER — Encounter (INDEPENDENT_AMBULATORY_CARE_PROVIDER_SITE_OTHER): Payer: Self-pay

## 2018-12-21 ENCOUNTER — Other Ambulatory Visit (HOSPITAL_COMMUNITY): Payer: 59

## 2018-12-26 ENCOUNTER — Ambulatory Visit (HOSPITAL_COMMUNITY): Admission: RE | Admit: 2018-12-26 | Payer: 59 | Source: Home / Self Care | Admitting: Internal Medicine

## 2018-12-26 ENCOUNTER — Encounter (HOSPITAL_COMMUNITY): Admission: RE | Payer: Self-pay | Source: Home / Self Care

## 2018-12-26 DIAGNOSIS — T50905A Adverse effect of unspecified drugs, medicaments and biological substances, initial encounter: Principal | ICD-10-CM

## 2018-12-26 DIAGNOSIS — K289 Gastrojejunal ulcer, unspecified as acute or chronic, without hemorrhage or perforation: Principal | ICD-10-CM

## 2018-12-26 SURGERY — EGD (ESOPHAGOGASTRODUODENOSCOPY)
Anesthesia: Moderate Sedation

## 2019-01-07 ENCOUNTER — Encounter: Payer: Self-pay | Admitting: Family Medicine

## 2019-01-07 ENCOUNTER — Telehealth: Payer: Self-pay | Admitting: Family Medicine

## 2019-01-07 NOTE — Telephone Encounter (Signed)
Pt is coming in this week for CPE with Jarrett Soho

## 2019-01-07 NOTE — Telephone Encounter (Signed)
Was this even scheduled as an annual physical??  pls let me know as a learning experience.  I will do what I can will respond , conversation is ongoing

## 2019-01-07 NOTE — Telephone Encounter (Signed)
Kristine Lawson sent a my chart msg about her appt 08/22/18.  She said she had asked for this to be her annual physical.  It was charged as 05107.  Information from the chart does not have annual physical.  I let her know I would ask you about this but I did not think this could be changed to a annual physical.  Please advise.  From cht :  HPI Kristine Lawson is here for follow up C/o facial acne  C/ anemia , needs colonoscopy C/o abnormal urine odor, wants to be checked for UTI  C/o leg cramps and generalized body cramps which are worsening with spasm worse at night, concerned as to underlying cause  C/o weight regain and is working on reversing this with more control with diet

## 2019-01-07 NOTE — Telephone Encounter (Signed)
I have put a ticket in on this.  The question has been sent to the billing [place] for review and correction.

## 2019-01-10 ENCOUNTER — Other Ambulatory Visit: Payer: Self-pay

## 2019-01-10 ENCOUNTER — Encounter: Payer: Self-pay | Admitting: Family Medicine

## 2019-01-10 ENCOUNTER — Ambulatory Visit (INDEPENDENT_AMBULATORY_CARE_PROVIDER_SITE_OTHER): Payer: 59 | Admitting: Family Medicine

## 2019-01-10 VITALS — BP 108/74 | HR 76 | Temp 98.1°F | Resp 12 | Ht 68.0 in | Wt 216.1 lb

## 2019-01-10 DIAGNOSIS — R350 Frequency of micturition: Secondary | ICD-10-CM | POA: Diagnosis not present

## 2019-01-10 DIAGNOSIS — M1990 Unspecified osteoarthritis, unspecified site: Secondary | ICD-10-CM | POA: Diagnosis not present

## 2019-01-10 DIAGNOSIS — Z0000 Encounter for general adult medical examination without abnormal findings: Secondary | ICD-10-CM

## 2019-01-10 DIAGNOSIS — E559 Vitamin D deficiency, unspecified: Secondary | ICD-10-CM

## 2019-01-10 DIAGNOSIS — Z9884 Bariatric surgery status: Secondary | ICD-10-CM

## 2019-01-10 DIAGNOSIS — E669 Obesity, unspecified: Secondary | ICD-10-CM | POA: Diagnosis not present

## 2019-01-10 LAB — POCT URINALYSIS DIP (CLINITEK)
Bilirubin, UA: NEGATIVE
Blood, UA: NEGATIVE
Glucose, UA: NEGATIVE mg/dL
Ketones, POC UA: NEGATIVE mg/dL
Leukocytes, UA: NEGATIVE
Nitrite, UA: NEGATIVE
POC PROTEIN,UA: NEGATIVE
Spec Grav, UA: 1.02 (ref 1.010–1.025)
Urobilinogen, UA: 0.2 E.U./dL
pH, UA: 6 (ref 5.0–8.0)

## 2019-01-10 MED ORDER — TRAMADOL HCL 50 MG PO TABS: ORAL_TABLET | ORAL | 0 refills | Status: DC

## 2019-01-10 MED ORDER — VITAMIN D (ERGOCALCIFEROL) 1.25 MG (50000 UNIT) PO CAPS: ORAL_CAPSULE | ORAL | 3 refills | Status: DC

## 2019-01-10 MED FILL — VIT D2 1.25 MG (50,000 UNIT: 1.25 MG | 84 days supply | Qty: 12 | Fill #0

## 2019-01-10 NOTE — Patient Instructions (Signed)
    Thank you for coming into the office today. I appreciate the opportunity to provide you with the care for your health and wellness. Today we discussed:   Follow Up: 1 year annual   Labs in 3 months   HEALTH MAINTENANCE RECOMMENDATIONS:  It is recommended that you get at least 30 minutes of aerobic exercise at least 5 days/week (for weight loss, you may need as much as 60-90 minutes). This can be any activity that gets your heart rate up. This can be divided in 10-15 minute intervals if needed, but try and build up your endurance at least once a week.  Weight bearing exercise is also recommended twice weekly.  Eat a healthy diet with lots of vegetables, fruits and fiber.  "Colorful" foods have a lot of vitamins (ie green vegetables, tomatoes, red peppers, etc).  Limit sweet tea, regular sodas and alcoholic beverages, all of which has a lot of calories and sugar.  Up to 1 alcoholic drink daily may be beneficial for women (unless trying to lose weight, watch sugars).  Drink a lot of water.  Calcium recommendations are 1200-1500 mg daily (1500 mg for postmenopausal women or women without ovaries), and vitamin D 1000 IU daily.  This should be obtained from diet and/or supplements (vitamins), and calcium should not be taken all at once, but in divided doses.  Monthly self breast exams and yearly mammograms for women over the age of 19 is recommended.  Sunscreen of at least SPF 30 should be used on all sun-exposed parts of the skin when outside between the hours of 10 am and 4 pm (not just when at beach or pool, but even with exercise, golf, tennis, and yard work!)  Use a sunscreen that says "broad spectrum" so it covers both UVA and UVB rays, and make sure to reapply every 1-2 hours.  Remember to change the batteries in your smoke detectors when changing your clock times in the spring and fall.  Use your seat belt every time you are in a car, and please drive safely and not be distracted with  cell phones and texting while driving.   [place] YOUR HANDS WELL AND FREQUENTLY. AVOID TOUCHING YOUR FACE, UNLESS YOUR HANDS ARE FRESHLY WASHED.  GET FRESH AIR DAILY. STAY HYDRATED WITH WATER.   It was a pleasure to see you and I look forward to continuing to work together on your health and well-being. Please do not hesitate to call the office if you need care or have questions about your care.  Have a wonderful day and week. With Gratitude, Cherly Beach, DNP, AGNP-BC

## 2019-01-10 NOTE — Addendum Note (Signed)
Addended by: Obie Dredge A on: 01/10/2019 10:28 AM   Modules accepted: Orders

## 2019-01-10 NOTE — Progress Notes (Signed)
Health Maintenance reviewed   Immunization History  Administered Date(s) Administered   Influenza Split 05/08/2014   PPD Test 12/15/2015   Tdap 08/30/2013   Last Pap smear: 2017- Due 2022 Last mammogram: 2019 Last colonoscopy: n/a has had upper EGD  Last DEXA: n/a Dentist: Due in July Ophtho: Needs to set up this year Exercise: 2 times a week, 30-45 minutes walking, play tennis Diet: low carbs and sweets, tries to avoid these as best as possible, eats a lot of protein, and veggies.  Other doctors caring for patient include:  Patient Care Team: Fayrene Helper, MD as PCP - General (Family Medicine) Buelah Manis, Modena Nunnery, MD (Family Medicine)    Subjective:   HPI  Kristine Lawson is a 48 y.o. female who presents for annual wellness visit and follow-up on chronic medical conditions.    Past Medical, Surgical, Social History, Allergies, and Medications have been Reviewed.   Review Of Systems  Review of Systems  Constitutional: Negative for activity change, appetite change, chills and fever.  HENT: Positive for congestion.   Eyes: Negative for discharge and visual disturbance.  Respiratory: Negative for cough and shortness of breath.   Cardiovascular: Negative for chest pain, palpitations and leg swelling.  Gastrointestinal: Negative.   Endocrine: Negative for polydipsia, polyphagia and polyuria.  Genitourinary: Positive for flank pain, frequency and urgency. Negative for difficulty urinating, dysuria, vaginal bleeding, vaginal discharge and vaginal pain.  Musculoskeletal: Positive for back pain.  Skin: Negative.   Allergic/Immunologic: Positive for environmental allergies.  Neurological: Negative for dizziness and headaches.  Hematological: Negative.   Psychiatric/Behavioral: Negative for confusion and sleep disturbance.  All other systems reviewed and are negative.   Objective:   PHYSICAL EXAM:  BP 108/74    Pulse 76    Temp 98.1 F (36.7 C)  (Temporal)    Resp 12    Ht 5\' 8"  (1.727 m)    Wt 216 lb 1.3 oz (98 kg)    SpO2 98%    BMI 32.85 kg/m    Physical Exam Vitals signs and nursing note reviewed.  Constitutional:      Appearance: Normal appearance. She is obese.  HENT:     Head: Normocephalic and atraumatic.     Right Ear: External ear normal.     Left Ear: External ear normal.     Nose: Nose normal.  Eyes:     General:        Right eye: No discharge.        Left eye: No discharge.     Conjunctiva/sclera: Conjunctivae normal.  Neck:     Musculoskeletal: Normal range of motion and neck supple.  Cardiovascular:     Rate and Rhythm: Normal rate and regular rhythm.     Pulses: Normal pulses.     Heart sounds: Normal heart sounds.  Pulmonary:     Effort: Pulmonary effort is normal.     Breath sounds: Normal breath sounds.  Musculoskeletal: Normal range of motion.  Skin:    General: Skin is warm.  Neurological:     Mental Status: She is alert and oriented to person, place, and time.  Psychiatric:        Mood and Affect: Mood normal.        Behavior: Behavior normal.        Thought Content: Thought content normal.        Judgment: Judgment normal.      Depression Screening  Depression screen [facility] 2/9 01/10/2019  08/22/2018 01/31/2018 01/31/2018 12/02/2014  Decreased Interest 0 1 3 1  0  Down, Depressed, Hopeless 0 0 3 1 0  PHQ - 2 Score 0 1 6 2  0  Altered sleeping - - 2 2 -  Tired, decreased energy - - 3 2 -  Change in appetite - - 3 2 -  Feeling bad or failure about yourself  - - 3 1 -  Trouble concentrating - - 3 1 -  Moving slowly or fidgety/restless - - 0 0 -  Suicidal thoughts - - 1 1 -  PHQ-9 Score - - 21 11 -  Difficult doing work/chores - - Somewhat difficult Very difficult -      Assessment & Plan:   1. Healthcare maintenance  Discussed monthly self breast exams and yearly mammograms; at least 30 minutes of aerobic activity at least 5 days/week and weight-bearing exercise 2x/week; proper  sunscreen use reviewed; healthy diet, including goals of calcium and vitamin D intake and alcohol recommendations (less than or equal to 1 drink/day) reviewed; regular seatbelt use; changing batteries in smoke detectors.  Immunization recommendations discussed.  Colonoscopy recommendations reviewed. Encourage dentist and eye exams through the year.  The patient's weight, height, BMI, and visual acuity have been recorded in the chart.  I have made referrals, counseling, and provided education to the patient based on review of the above and I have provided the patient with a written personalized care plan for preventive services.    2. Overweight (BMI 30.0-34.9) Has gain some weight, but overall doing well. Is trying to eat a wellbalanced diet.  Encourage to continue this in addition to walking 5 days of the week for 60 minutes at a time.  - TSH  3. S/P gastric bypass Stable, maintain vitamins.  - CBC with Differential/Platelet - VITAMIN D 25 Hydroxy (Vit-D Deficiency, Fractures)  4. Vitamin D deficiency Recheck of vitamin D level needed Refill provided   - VITAMIN D 25 Hydroxy (Vit-D Deficiency, Fractures) - Vitamin D, Ergocalciferol, (DRISDOL) 1.25 MG (50000 UT) CAPS capsule; TAKE 1 CAPSULE BY MOUTH EVERY 7 DAYS.  Dispense: 12 capsule; Refill: 3  5. Osteoarthritis, unspecified osteoarthritis type, unspecified site Stable, not as well controlled per her, but unable to take naproxen due to ulcer. Refill provided.   - traMADol (ULTRAM) 50 MG tablet; TAKE 1 TABLET BY MOUTH ONCE DAILY FOR UNCONTROLLED KNEE PAIN IF NEEDED  Dispense: 90 tablet; Refill: 0  6. Increased frequency of urination Reported some increase in urination and urgency. UA in office neg  - Urinalysis   Follow Up: 1 year for annual without pap  Perlie Mayo, NP   01/10/2019

## 2019-01-14 MED FILL — OLMESARTAN-HCTZ 20-12.5 MG: 20-12.5 | 30 days supply | Qty: 30 | Fill #1

## 2019-02-04 ENCOUNTER — Encounter: Payer: Self-pay | Admitting: Family Medicine

## 2019-02-06 ENCOUNTER — Encounter: Payer: Self-pay | Admitting: Family Medicine

## 2019-02-06 ENCOUNTER — Other Ambulatory Visit: Payer: Self-pay

## 2019-02-06 ENCOUNTER — Other Ambulatory Visit: Payer: Self-pay | Admitting: Family Medicine

## 2019-02-06 MED ORDER — OLMESARTAN MEDOXOMIL-HCTZ 20-12.5 MG PO TABS: 1.0000 | ORAL_TABLET | Freq: Every day | ORAL | 3 refills | Status: DC

## 2019-02-06 MED ORDER — HYDROXYZINE HCL 50 MG PO TABS: 150.0000 mg | ORAL_TABLET | Freq: Every day | ORAL | 1 refills | Status: DC

## 2019-02-06 MED FILL — hydrOXYzine HCL 50 MG TABS: 50 | 90 days supply | Qty: 270 | Fill #0

## 2019-02-07 MED FILL — OLMESARTAN-HCTZ 20-12.5 MG: 20-12.5 | 30 days supply | Qty: 30 | Fill #2

## 2019-03-06 MED FILL — OLMESARTAN-HCTZ 20-12.5 MG: 20-12.5 | 30 days supply | Qty: 30 | Fill #3

## 2019-03-11 MED FILL — traMADol HCL 50 MG TABS: 50 | 90 days supply | Qty: 90 | Fill #0

## 2019-04-15 MED FILL — OLMESARTAN-HCTZ 20-12.5 MG: 20-12.5 | 30 days supply | Qty: 30 | Fill #4

## 2019-05-01 ENCOUNTER — Encounter: Payer: Self-pay | Admitting: Family Medicine

## 2019-05-02 ENCOUNTER — Other Ambulatory Visit: Payer: Self-pay | Admitting: Family Medicine

## 2019-05-02 DIAGNOSIS — L700 Acne vulgaris: Secondary | ICD-10-CM

## 2019-05-02 MED ORDER — CLINDAMYCIN PHOS-BENZOYL PEROX 1-5 % EX GEL: CUTANEOUS | 1 refills | Status: DC

## 2019-05-02 MED FILL — CLINDAMYCIN PHOS-BENZOYL PE: 1-5 | 30 days supply | Qty: 50 | Fill #0

## 2019-05-13 ENCOUNTER — Encounter: Payer: Self-pay | Admitting: Family Medicine

## 2019-05-15 ENCOUNTER — Telehealth: Payer: Self-pay

## 2019-05-22 MED FILL — OLMESARTAN-HCTZ 20-12.5 MG: 20-12.5 | 30 days supply | Qty: 30 | Fill #5

## 2019-05-27 ENCOUNTER — Other Ambulatory Visit: Payer: Self-pay | Admitting: Family Medicine

## 2019-05-27 DIAGNOSIS — M1990 Unspecified osteoarthritis, unspecified site: Secondary | ICD-10-CM

## 2019-05-28 ENCOUNTER — Encounter: Payer: Self-pay | Admitting: Family Medicine

## 2019-05-28 ENCOUNTER — Other Ambulatory Visit: Payer: Self-pay | Admitting: Family Medicine

## 2019-05-28 MED ORDER — TRAMADOL HCL 50 MG PO TABS: ORAL_TABLET | ORAL | 0 refills | Status: DC

## 2019-05-29 ENCOUNTER — Encounter: Payer: Self-pay | Admitting: Family Medicine

## 2019-06-05 ENCOUNTER — Other Ambulatory Visit: Payer: Self-pay

## 2019-06-05 ENCOUNTER — Encounter: Payer: Self-pay | Admitting: Family Medicine

## 2019-06-05 ENCOUNTER — Ambulatory Visit (INDEPENDENT_AMBULATORY_CARE_PROVIDER_SITE_OTHER): Payer: 59 | Admitting: Family Medicine

## 2019-06-05 VITALS — Ht 68.0 in | Wt 217.0 lb

## 2019-06-05 DIAGNOSIS — M1990 Unspecified osteoarthritis, unspecified site: Secondary | ICD-10-CM | POA: Diagnosis not present

## 2019-06-05 DIAGNOSIS — L700 Acne vulgaris: Secondary | ICD-10-CM | POA: Diagnosis not present

## 2019-06-05 MED ORDER — DOXYCYCLINE HYCLATE 100 MG PO CAPS: 100.0000 mg | ORAL_CAPSULE | Freq: Two times a day (BID) | ORAL | 0 refills | Status: AC

## 2019-06-05 MED FILL — DOXYCYCLINE HYCLATE 100 MG: 100 | 14 days supply | Qty: 28 | Fill #0

## 2019-06-05 NOTE — Patient Instructions (Addendum)
  I appreciate the opportunity to provide you with care for your health and wellness. Today we discussed: Arthritis and acne  Follow [id number] 2021 annual, with Pap-Dr. Moshe Cipro  No labs or referrals today  Sent in doxycycline for you to take to see if we can knock some of that cystic acne down.  Tylenol is usually best for osteoarthritis  Use of long term Ultram is hard on kidney's so if you can get relief with tylenol, try using it in rotation with the ultram.  I hope you have a wonderful, happy, safe, and healthy Holiday Season! See you in the New Year :)  Please continue to practice social distancing to keep you, your family, and our community safe.  If you must go out, please wear a mask and practice good handwashing.  It was a pleasure to see you and I look forward to continuing to work together on your health and well-being. Please do not hesitate to call the office if you need care or have questions about your care.  Have a wonderful day and week. With Gratitude, Cherly Beach, DNP, AGNP-BC

## 2019-06-05 NOTE — Progress Notes (Signed)
Virtual Visit via Telephone Note   This visit type was conducted due to national recommendations for restrictions regarding the COVID-19 Pandemic (e.g. social distancing) in an effort to limit this patient's exposure and mitigate transmission in our community.  Due to her co-morbid illnesses, this patient is at least at moderate risk for complications without adequate follow up.  This format is felt to be most appropriate for this patient at this time.  The patient did not have access to video technology/had technical difficulties with video requiring transitioning to audio format only (telephone).  All issues noted in this document were discussed and addressed.  No physical exam could be performed with this format.     Evaluation Performed:  Follow-up visit  Date:  06/05/2019   ID:  Kristine Lawson, DOB May 26, 1971, MRN [id number]  Patient Location: Home Provider Location: Office  Location of Patient: Home Location of Provider: Telehealth Consent was obtain for visit to be over via telehealth. I verified that I am speaking with the correct person using two identifiers.  PCP:  Fayrene Helper, MD   Chief Complaint:    History of Present Illness:    Kristine Lawson is a 48 y.o. female with history of allergy, osteoarthritis, hypertension and acne.  Presents today due to increasing arthritis pain and cystic acne that is not being treated with her topical.  Arthritis: Reports having a tramadol today because her arthritis pain has gotten worse with a temperature change.  Has been told she does not have RA was told that she had osteoarthritis.  Multiple joints.  Has had to stop taking Aleve secondary to ulcer formation.  Has been taking tramadol and had to take it more than once a day normally she just takes it once a day has not tried Tylenol.  Acne: Just restarted taking her BenzaClin topically reports that she has a cystic acne that is not forming ahead without use of the  BenzaClin plus heat compresses.  Having some pain and rather large cystic-like acne.  Tender and uncomfortable has had to use antibiotics in the past to help cleared up.  Overall she has no other complaints today.   The patient does not have symptoms concerning for COVID-19 infection (fever, chills, cough, or new shortness of breath).   Past Medical, Surgical, Social History, Allergies, and Medications have been Reviewed. Past Medical History:  Diagnosis Date  . Allergy   . Arthritis   . Hypertension   . Insomnia    meds needed daily to achieve 6+ hours sleep.   Past Surgical History:  Procedure Laterality Date  . ABLATION     uterine ablation  . APPENDECTOMY    . BREATH TEK H PYLORI N/A 11/18/2014   Procedure: BREATH TEK H PYLORI;  Surgeon: Greer Pickerel, MD;  Location: [facility];  Service: General;  Laterality: N/A;  . CHOLECYSTECTOMY    . ESOPHAGOGASTRODUODENOSCOPY N/A 10/01/2018   Procedure: ESOPHAGOGASTRODUODENOSCOPY (EGD);  Surgeon: Rogene Houston, MD;  [facility];  Service: Endoscopy;  Laterality: N/A;  . LAPAROSCOPIC ROUX-EN-Y GASTRIC BYPASS WITH HIATAL HERNIA REPAIR N/A 02/10/2015   Procedure: LAPAROSCOPIC ROUX-EN-Y GASTRIC BYPASS ;  Surgeon: Greer Pickerel, MD;  Location: [facility];  Service: General;  Laterality: N/A;  . TUBAL LIGATION    . UPPER GI ENDOSCOPY N/A 02/10/2015   Procedure: UPPER GI ENDOSCOPY;  Surgeon: Greer Pickerel, MD;  Location: [facility];  Service: General;  Laterality: N/A;     Current Meds  Medication Sig  . acyclovir (ZOVIRAX) 400 MG tablet Take 1 tablet (400 mg total) by mouth as needed.  . calcium gluconate 500 MG tablet Take 1 tablet by mouth 3 (three) times daily.   . clindamycin-benzoyl peroxide (BENZACLIN) gel Apply twice daily to face  . hydrOXYzine (ATARAX/VISTARIL) 50 MG tablet Take 3 tablets (150 mg total) by mouth at bedtime.  Marland Kitchen ibuprofen (ADVIL,MOTRIN) 400 MG tablet Take 1 tablet (400 mg total) by mouth 2 (two) times daily as needed.   . Multiple Vitamin (MULTIVITAMIN) tablet Take 1 tablet by mouth daily.   Marland Kitchen olmesartan-hydrochlorothiazide (BENICAR HCT) 20-12.5 MG tablet Take 1 tablet by mouth daily.  . pantoprazole (PROTONIX) 40 MG tablet Take 1 tablet (40 mg total) by mouth 2 (two) times daily before a meal.  . Pediatric Multivitamins-Iron (FLINTSTONES PLUS IRON) chewable tablet Chew 1 tablet by mouth 2 (two) times daily.  . traMADol (ULTRAM) 50 MG tablet TAKE 1 TABLET BY MOUTH ONCE DAILY FOR UNCONTROLLED KNEE PAIN IF NEEDED  . traMADol (ULTRAM) 50 MG tablet Take one tablet by mouth once daily for uncontrolled pain  . traZODone (DESYREL) 50 MG tablet TAKE 1 TABLET BY MOUTH AT BEDTIME  . Vitamin D, Ergocalciferol, (DRISDOL) 1.25 MG (50000 UT) CAPS capsule TAKE 1 CAPSULE BY MOUTH EVERY 7 DAYS.     Allergies:   Lisinopril, Aspirin, and Triamterene   Social History   Tobacco Use  . Smoking status: Former Smoker    Packs/day: 0.25    Years: 3.00    Pack years: 0.75    Quit date: 02/05/2007    Years since quitting: 12.3  . Smokeless tobacco: Never Used  Substance Use Topics  . Alcohol use: No  . Drug use: No     Family Hx: The patient's family history includes Blindness in her daughter; Cancer in her mother; Colon cancer (age of onset: 55) in her mother; Diabetes in her brother and mother; Healthy in her daughter, sister, and son; Heart disease in her father; Hyperlipidemia in her father and mother; Hypertension in her brother and mother; Kidney disease in her daughter, father, and mother; Other in her daughter; Stroke in her father.  ROS:   Please see the history of present illness.    All other systems reviewed and are negative.   Labs/Other Tests and Data Reviewed:    Recent Labs: 09/19/2018: ALT 24; BUN 20; Creat 0.83; Magnesium 1.9; Potassium 3.8; Sodium 139 12/03/2018: Hemoglobin 12.8; Platelets 376   Recent Lipid Panel Lab Results  Component Value Date/Time   CHOL 150 09/19/2018 07:25 AM   TRIG 40  09/19/2018 07:25 AM   HDL 52 09/19/2018 07:25 AM   CHOLHDL 2.9 09/19/2018 07:25 AM   LDLCALC 87 09/19/2018 07:25 AM    Wt Readings from Last 3 Encounters:  06/05/19 217 lb (98.4 kg)  01/10/19 216 lb 1.3 oz (98 kg)  09/24/18 218 lb 14.4 oz (99.3 kg)     Objective:    Vital Signs:  Ht 5\' 8"  (1.727 m)   Wt 217 lb (98.4 kg)   BMI 32.99 kg/m    GEN:  Alert and oriented RESPIRATORY:  No shortness of breath noted in conversation PSYCH:  Normal affect and mood  ASSESSMENT & PLAN:    1. Acne vulgaris Not well controlled at this time.  Topical medication not working as good.  Will prescribe doxycycline to see if she can get the cystic acne under control.  Advised for her to take this as directed.  And to continue to use the topical medication.  - doxycycline (VIBRAMYCIN) 100 MG capsule; Take 1 capsule (100 mg total) by mouth 2 (two) times daily for 14 days.  Dispense: 28 capsule; Refill: 0  2. Arthritis Not as well-controlled.  Reports she has been increasing her use of Ultram.  Has not tried Tylenol at this time.  Strongly educated on the use of current Ultram with the impact on kidneys.  As well as trying to use Tylenol either in conjunction or prior to use of Ultram to help reduce the amount that she is taking.  Has not had x-rays that I am aware of.  She reports she was told that she did not have RA.  Played a lot of sports and is a little bit heavier weight wise from what she was told that is what caused her to have osteoarthritis.    Time:   Today, I have spent 10 minutes with the patient with telehealth technology discussing the above problems.     Medication Adjustments/Labs and Tests Ordered: Current medicines are reviewed at length with the patient today.  Concerns regarding medicines are outlined above.   Tests Ordered: No orders of the defined types were placed in this encounter.   Medication Changes: Meds ordered this encounter  Medications  . doxycycline  (VIBRAMYCIN) 100 MG capsule    Sig: Take 1 capsule (100 mg total) by mouth 2 (two) times daily for 14 days.    Dispense:  28 capsule    Refill:  0    Order Specific Question:   Supervising Provider    Answer:   Fayrene Helper [id number]    Disposition:  Follow up June 2021 annual with Pap Dr. Moshe Cipro  Signed, Perlie Mayo, NP  06/05/2019 11:36 AM     [place]

## 2019-06-06 MED FILL — traMADol HCL 50 MG TABS: 50 | 90 days supply | Qty: 90 | Fill #0

## 2019-06-21 ENCOUNTER — Encounter: Payer: Self-pay | Admitting: Family Medicine

## 2019-06-25 MED FILL — OLMESARTAN-HCTZ 20-12.5 MG: 20-12.5 | 30 days supply | Qty: 30 | Fill #6

## 2019-07-24 MED FILL — OLMESARTAN-HCTZ 20-12.5 MG: 20-12.5 | 30 days supply | Qty: 30 | Fill #0

## 2019-07-24 MED FILL — hydrOXYzine HCL 50 MG TABS: 50 | 90 days supply | Qty: 270 | Fill #1

## 2019-07-30 MED FILL — VIT D2 1.25 MG (50,000 UNIT: 1.25 MG | 84 days supply | Qty: 12 | Fill #1

## 2019-08-02 ENCOUNTER — Encounter: Payer: Self-pay | Admitting: Family Medicine

## 2019-08-22 MED FILL — OLMESARTAN-HCTZ 20-12.5 MG: 20-12.5 | 30 days supply | Qty: 30 | Fill #1

## 2019-09-06 DIAGNOSIS — H5213 Myopia, bilateral: Secondary | ICD-10-CM | POA: Diagnosis not present

## 2019-09-06 DIAGNOSIS — H52223 Regular astigmatism, bilateral: Secondary | ICD-10-CM | POA: Diagnosis not present

## 2019-09-06 DIAGNOSIS — H524 Presbyopia: Secondary | ICD-10-CM | POA: Diagnosis not present

## 2019-09-09 ENCOUNTER — Telehealth: Payer: Self-pay

## 2019-09-09 ENCOUNTER — Encounter: Payer: Self-pay | Admitting: Family Medicine

## 2019-09-09 DIAGNOSIS — E785 Hyperlipidemia, unspecified: Secondary | ICD-10-CM

## 2019-09-09 DIAGNOSIS — E559 Vitamin D deficiency, unspecified: Secondary | ICD-10-CM | POA: Diagnosis not present

## 2019-09-09 DIAGNOSIS — I10 Essential (primary) hypertension: Secondary | ICD-10-CM | POA: Diagnosis not present

## 2019-09-09 DIAGNOSIS — D539 Nutritional anemia, unspecified: Secondary | ICD-10-CM | POA: Diagnosis not present

## 2019-09-09 NOTE — Telephone Encounter (Signed)
Labs ordered.

## 2019-09-09 NOTE — Telephone Encounter (Signed)
Labs ordered per patient

## 2019-09-10 ENCOUNTER — Encounter: Payer: Self-pay | Admitting: Family Medicine

## 2019-09-10 ENCOUNTER — Other Ambulatory Visit: Payer: Self-pay | Admitting: Family Medicine

## 2019-09-10 LAB — LIPID PANEL
Cholesterol: 199 mg/dL (ref <200)
HDL: 62 mg/dL (ref >=50)
LDL Cholesterol (Calc): 118 mg/dL (calc) — ABNORMAL HIGH
Non-HDL Cholesterol (Calc): 137 mg/dL (calc) — ABNORMAL HIGH (ref <130)
Total CHOL/HDL Ratio: 3.2 (calc) (ref <5.0)
Triglycerides: 88 mg/dL (ref <150)

## 2019-09-10 LAB — COMPLETE METABOLIC PANEL WITH GFR
AG Ratio: 1.3 (calc) (ref 1.0–2.5)
ALT: 25 U/L (ref 6–29)
AST: 23 U/L (ref 10–35)
Albumin: 4.3 g/dL (ref 3.6–5.1)
Alkaline phosphatase (APISO): 49 U/L (ref 31–125)
BUN: 12 mg/dL (ref 7–25)
CO2: 29 mmol/L (ref 20–32)
Calcium: 9.4 mg/dL (ref 8.6–10.2)
Chloride: 106 mmol/L (ref 98–110)
Creat: 0.8 mg/dL (ref 0.50–1.10)
GFR, Est African American: 101 mL/min/{1.73_m2} (ref >=60)
GFR, Est Non African American: 87 mL/min/{1.73_m2} (ref >=60)
Globulin: 3.2 g/dL (calc) (ref 1.9–3.7)
Glucose, Bld: 84 mg/dL (ref 65–99)
Potassium: 4.3 mmol/L (ref 3.5–5.3)
Sodium: 140 mmol/L (ref 135–146)
Total Bilirubin: 0.5 mg/dL (ref 0.2–1.2)
Total Protein: 7.5 g/dL (ref 6.1–8.1)

## 2019-09-10 LAB — CBC WITH DIFFERENTIAL/PLATELET
Absolute Monocytes: 400 cells/uL (ref 200–950)
Basophils Absolute: 38 cells/uL (ref 0–200)
Basophils Relative: 0.7 %
Eosinophils Absolute: 200 cells/uL (ref 15–500)
Eosinophils Relative: 3.7 %
HCT: 41.6 % (ref 35.0–45.0)
Hemoglobin: 13.9 g/dL (ref 11.7–15.5)
Lymphs Abs: 2489 cells/uL (ref 850–3900)
MCH: 29 pg (ref 27.0–33.0)
MCHC: 33.4 g/dL (ref 32.0–36.0)
MCV: 86.8 fL (ref 80.0–100.0)
MPV: 8.6 fL (ref 7.5–12.5)
Monocytes Relative: 7.4 %
Neutro Abs: 2273 cells/uL (ref 1500–7800)
Neutrophils Relative %: 42.1 %
Platelets: 354 10*3/uL (ref 140–400)
RBC: 4.79 10*6/uL (ref 3.80–5.10)
RDW: 12.9 % (ref 11.0–15.0)
Total Lymphocyte: 46.1 %
WBC: 5.4 10*3/uL (ref 3.8–10.8)

## 2019-09-10 LAB — VITAMIN B12: Vitamin B-12: 809 pg/mL (ref 200–1100)

## 2019-09-10 LAB — VITAMIN D 25 HYDROXY (VIT D DEFICIENCY, FRACTURES): Vit D, 25-Hydroxy: 28 ng/mL — ABNORMAL LOW (ref 30–100)

## 2019-09-10 LAB — TSH: TSH: 0.57 m[IU]/L

## 2019-09-10 MED ORDER — ERGOCALCIFEROL 1.25 MG (50000 UT) PO CAPS: 50000.0000 [IU] | ORAL_CAPSULE | ORAL | 1 refills | Status: DC

## 2019-09-18 MED FILL — OLMESARTAN-HCTZ 20-12.5 MG: 20-12.5 | 30 days supply | Qty: 30 | Fill #2

## 2019-10-09 ENCOUNTER — Encounter: Payer: Self-pay | Admitting: Specialist

## 2019-10-09 DIAGNOSIS — Z1231 Encounter for screening mammogram for malignant neoplasm of breast: Secondary | ICD-10-CM | POA: Diagnosis not present

## 2019-10-09 DIAGNOSIS — Z6834 Body mass index (BMI) 34.0-34.9, adult: Secondary | ICD-10-CM | POA: Diagnosis not present

## 2019-10-09 DIAGNOSIS — R3915 Urgency of urination: Secondary | ICD-10-CM | POA: Diagnosis not present

## 2019-10-09 DIAGNOSIS — Z01419 Encounter for gynecological examination (general) (routine) without abnormal findings: Secondary | ICD-10-CM | POA: Diagnosis not present

## 2019-10-09 DIAGNOSIS — R350 Frequency of micturition: Secondary | ICD-10-CM | POA: Diagnosis not present

## 2019-10-10 ENCOUNTER — Encounter: Payer: Self-pay | Admitting: Family Medicine

## 2019-10-11 ENCOUNTER — Telehealth: Payer: Self-pay

## 2019-10-11 ENCOUNTER — Other Ambulatory Visit: Payer: Self-pay | Admitting: Family Medicine

## 2019-10-11 DIAGNOSIS — Z1211 Encounter for screening for malignant neoplasm of colon: Secondary | ICD-10-CM

## 2019-10-11 DIAGNOSIS — Z803 Family history of malignant neoplasm of breast: Secondary | ICD-10-CM

## 2019-10-11 NOTE — Telephone Encounter (Signed)
Referred to [facility] for colonoscopy

## 2019-10-11 NOTE — Telephone Encounter (Signed)
Referral done for colonoscopy. Not sure how to do the referral for the oncology testing

## 2019-10-14 ENCOUNTER — Encounter: Payer: Self-pay | Admitting: Family Medicine

## 2019-10-14 ENCOUNTER — Encounter (INDEPENDENT_AMBULATORY_CARE_PROVIDER_SITE_OTHER): Payer: Self-pay | Admitting: *Deleted

## 2019-10-21 ENCOUNTER — Encounter (HOSPITAL_COMMUNITY): Payer: Self-pay | Admitting: *Deleted

## 2019-10-21 NOTE — Progress Notes (Signed)
Oncology Navigator Note:  Patient was referred to our office by her primary care physician for genetic testing.   I called patient today to introduce myself and provide information in how I will be involved with their care.  I provided information on their first visit and what to expect.  I made sure patient was aware of appointment time and directions to the cancer center.  My phone number was given so that she can call me with any questions or concerns.  She voices appreciation and understanding.

## 2019-10-24 ENCOUNTER — Encounter (HOSPITAL_COMMUNITY): Payer: Self-pay | Admitting: Surgery

## 2019-10-24 ENCOUNTER — Other Ambulatory Visit: Payer: Self-pay | Admitting: Family Medicine

## 2019-10-24 ENCOUNTER — Other Ambulatory Visit: Payer: Self-pay

## 2019-10-24 MED FILL — OLMESARTAN-HCTZ 20-12.5 MG: 20-12.5 | 30 days supply | Qty: 30 | Fill #3

## 2019-10-24 MED FILL — VIT D2 1.25 MG (50,000 UNIT: 1.25 MG | 84 days supply | Qty: 12 | Fill #2

## 2019-10-25 MED FILL — hydrOXYzine HCL 50 MG TABS: 50 | 90 days supply | Qty: 270 | Fill #0

## 2019-10-28 ENCOUNTER — Inpatient Hospital Stay (HOSPITAL_COMMUNITY): Payer: 59 | Attending: Hematology | Admitting: Hematology

## 2019-10-28 ENCOUNTER — Other Ambulatory Visit: Payer: Self-pay

## 2019-10-28 ENCOUNTER — Encounter (INDEPENDENT_AMBULATORY_CARE_PROVIDER_SITE_OTHER): Payer: Self-pay | Admitting: *Deleted

## 2019-10-28 ENCOUNTER — Encounter (HOSPITAL_COMMUNITY): Payer: Self-pay | Admitting: Hematology

## 2019-10-28 ENCOUNTER — Other Ambulatory Visit (INDEPENDENT_AMBULATORY_CARE_PROVIDER_SITE_OTHER): Payer: Self-pay | Admitting: *Deleted

## 2019-10-28 DIAGNOSIS — Z79899 Other long term (current) drug therapy: Secondary | ICD-10-CM | POA: Diagnosis not present

## 2019-10-28 DIAGNOSIS — I10 Essential (primary) hypertension: Secondary | ICD-10-CM | POA: Insufficient documentation

## 2019-10-28 DIAGNOSIS — Z801 Family history of malignant neoplasm of trachea, bronchus and lung: Secondary | ICD-10-CM | POA: Insufficient documentation

## 2019-10-28 DIAGNOSIS — Z87891 Personal history of nicotine dependence: Secondary | ICD-10-CM | POA: Diagnosis not present

## 2019-10-28 DIAGNOSIS — Z809 Family history of malignant neoplasm, unspecified: Secondary | ICD-10-CM | POA: Insufficient documentation

## 2019-10-28 DIAGNOSIS — Z1211 Encounter for screening for malignant neoplasm of colon: Secondary | ICD-10-CM

## 2019-10-28 DIAGNOSIS — Z800 Family history of malignant neoplasm of digestive organs: Secondary | ICD-10-CM

## 2019-10-28 DIAGNOSIS — K259 Gastric ulcer, unspecified as acute or chronic, without hemorrhage or perforation: Secondary | ICD-10-CM

## 2019-10-28 DIAGNOSIS — Z8049 Family history of malignant neoplasm of other genital organs: Secondary | ICD-10-CM | POA: Diagnosis not present

## 2019-10-28 DIAGNOSIS — Z8042 Family history of malignant neoplasm of prostate: Secondary | ICD-10-CM | POA: Insufficient documentation

## 2019-10-28 DIAGNOSIS — M1990 Unspecified osteoarthritis, unspecified site: Secondary | ICD-10-CM | POA: Insufficient documentation

## 2019-10-28 DIAGNOSIS — Z803 Family history of malignant neoplasm of breast: Secondary | ICD-10-CM | POA: Insufficient documentation

## 2019-10-28 NOTE — Assessment & Plan Note (Signed)
1.  Extensive family history of malignancy: -Patient evaluated at the request of Dr. Moshe Cipro and Dr. Garwin Brothers. -She does not have any personal history of malignancy.  No B symptoms. -Did not have colonoscopy yet.  Had Pap smear recently which was normal. -Mammogram on 10/09/2019 was normal. -Family history significant for mother with breast cancer at age 49.  3 paternal aunts had breast cancer.  Paternal grandfather had lung cancer.  Maternal grandfather had prostate cancer.  Maternal grandmother had cervical cancer and died in her 20s.  Maternal aunt died of gallbladder cancer.  Multiple maternal second cousins had breast cancers. -She does not have any signs or symptoms of malignancy to do further work-up.  She has colonoscopy scheduled. -I will make a referral to our geneticist to see if she qualifies for genetic testing. -We will do a phone visit after genetic testing is resulted.

## 2019-10-28 NOTE — Progress Notes (Signed)
[facility] NOTE  Patient Care Team: Fayrene Helper, MD as PCP - General (Family Medicine) Buelah Manis, Modena Nunnery, MD (Family Medicine) Derek Jack, MD as Medical Oncologist (Oncology) Donetta Potts, RN as Oncology Nurse Navigator (Oncology)  CHIEF COMPLAINTS/PURPOSE OF CONSULTATION:  Family history of malignancy.  HISTORY OF PRESENTING ILLNESS:  Kristine Lawson 49 y.o. female is seen in consultation today at the request of Dr. Garwin Brothers and Dr. Moshe Cipro for extensive family history of malignancy.  She recently had Pap smear which was normal.  Did not have a colonoscopy but is planning to have one soon.  Had a mammogram on 10/09/2019 which was normal.  Mammogram was done at Dr. Harvie Bridge office.  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  Family history was significant for mother with breast cancer at age 72.  3 paternal aunts had breast cancer.  Maternal grandmother had cervical cancer and died in her 16s.  Paternal grandfather had lung cancer.  Maternal grandfather had prostate cancer.  Maternal aunt died of gallbladder cancer.  Maternal second cousins multiple of them had breast cancers.  Patient denies any fevers, night sweats or weight loss.  No new onset pains.  No personal history of any malignancies including skin cancers.  Appetite and energy levels are 100%.  MEDICAL HISTORY:  Past Medical History:  Diagnosis Date  . Allergy   . Anemia    IDA  . Arthritis   . Hypertension   . Insomnia    meds needed daily to achieve 6+ hours sleep.    SURGICAL HISTORY: Past Surgical History:  Procedure Laterality Date  . ABLATION     uterine ablation  . APPENDECTOMY    . BREATH TEK H PYLORI N/A 11/18/2014   Procedure: BREATH TEK H PYLORI;  Surgeon: Greer Pickerel, MD;  Location: [facility];  Service: General;  Laterality: N/A;  . CHOLECYSTECTOMY    . ESOPHAGOGASTRODUODENOSCOPY N/A 10/01/2018   Procedure: ESOPHAGOGASTRODUODENOSCOPY  (EGD);  Surgeon: Rogene Houston, MD;  [facility];  Service: Endoscopy;  Laterality: N/A;  . LAPAROSCOPIC ROUX-EN-Y GASTRIC BYPASS WITH HIATAL HERNIA REPAIR N/A 02/10/2015   Procedure: LAPAROSCOPIC ROUX-EN-Y GASTRIC BYPASS ;  Surgeon: Greer Pickerel, MD;  Location: [facility];  Service: General;  Laterality: N/A;  . TUBAL LIGATION    . UPPER GI ENDOSCOPY N/A 02/10/2015   Procedure: UPPER GI ENDOSCOPY;  Surgeon: Greer Pickerel, MD;  Location: [facility];  Service: General;  Laterality: N/A;    SOCIAL HISTORY: Social History   Socioeconomic History  . Marital status: Married    Spouse name: [name]  . Number of children: 4  . Years of education: Not on file  . Highest education level: Associate degree: occupational, Hotel manager, or vocational program  Occupational History  . Occupation: CARDIOLOGY    Employer: [employer]  Tobacco Use  . Smoking status: Former Smoker    Packs/day: 0.25    Years: 3.00    Pack years: 0.75    Quit date: 02/05/2007    Years since quitting: 12.7  . Smokeless tobacco: Never Used  Substance and Sexual Activity  . Alcohol use: Yes    Comment: occassional wine 2-3 times a year  . Drug use: No  . Sexual activity: Yes    Birth control/protection: Surgical  Other Topics Concern  . Not on file  Social History Narrative   Lives [name] husband-10 years    4 kids  blended together   3 biologically hers.      No pets in the home   Enjoy: playing tennis, watching games on tv , hgtv      Diet: well balanced, eats a lot of protein, and veggies, tries to avoid sweets and carbs   Caffeine: 2-3 cups daily   Water: 2.5 16 oz bottles      Wear seat beat    Doesn't wear sunscreen    Smoke and carbon monoxide detectors    Uses speaker phone while driving, avoids texting.    Social Determinants of Health   Financial Resource Strain: Low Risk   . Difficulty of Paying Living Expenses: Not hard at all  Food Insecurity: No Food Insecurity  .  Worried About Charity fundraiser in the Last Year: Never true  . Ran Out of Food in the Last Year: Never true  Transportation Needs: No Transportation Needs  . Lack of Transportation (Medical): No  . Lack of Transportation (Non-Medical): No  Physical Activity: Insufficiently Active  . Days of Exercise per Week: 2 days  . Minutes of Exercise per Session: 40 min  Stress: No Stress Concern Present  . Feeling of Stress : Only a little  Social Connections: Slightly Isolated  . Frequency of Communication with Friends and Family: More than three times a week  . Frequency of Social Gatherings with Friends and Family: Twice a week  . Attends Religious Services: More than 4 times per year  . Active Member of Clubs or Organizations: No  . Attends Archivist Meetings: Never  . Marital Status: Married  Human resources officer Violence: Not At Risk  . Fear of Current or Ex-Partner: No  . Emotionally Abused: No  . Physically Abused: No  . Sexually Abused: No    FAMILY HISTORY: Family History  Problem Relation Age of Onset  . Hypertension Mother   . Diabetes Mother   . Kidney disease Mother   . Hyperlipidemia Mother   . Cancer Mother        breast  . Colon cancer Mother 44       highly suspious of colon mass  . Kidney disease Father   . Stroke Father   . Heart disease Father   . Hyperlipidemia Father   . Heart attack Father   . Diverticulitis Father   . Hypertension Brother   . Diabetes Brother   . Healthy Sister   . Other Daughter        hypertension  . Kidney disease Daughter   . Blindness Daughter   . Healthy Daughter   . Healthy Son   . Gallbladder disease Maternal Aunt   . Breast cancer Paternal Aunt   . Cervical cancer Maternal Grandmother   . Prostate cancer Maternal Grandfather   . Heart attack Paternal Grandmother   . Hypertension Paternal Grandmother   . Stroke Paternal Grandmother   . Lung cancer Paternal Grandfather   . Throat cancer Paternal Grandfather   .  Breast cancer Paternal Aunt   . Breast cancer Paternal Aunt     ALLERGIES:  is allergic to lisinopril; aspirin; and triamterene.  MEDICATIONS:  Current Outpatient Medications  Medication Sig Dispense Refill  . acyclovir (ZOVIRAX) 400 MG tablet Take 1 tablet (400 mg total) by mouth as needed. 90 tablet 1  . calcium gluconate 500 MG tablet Take 1 tablet by mouth 3 (three) times daily.     . clindamycin-benzoyl peroxide (BENZACLIN) gel Apply twice daily to face (  Patient taking differently: as needed. ) 50 g 1  . ergocalciferol (VITAMIN D2) 1.25 MG (50000 UT) capsule Take 1 capsule (50,000 Units total) by mouth once a week. One capsule once weekly 12 capsule 1  . hydrOXYzine (ATARAX/VISTARIL) 50 MG tablet TAKE 3 TABLETS BY MOUTH AT BEDTIME 270 tablet 1  . ibuprofen (ADVIL,MOTRIN) 400 MG tablet Take 1 tablet (400 mg total) by mouth 2 (two) times daily as needed. 60 tablet 1  . Multiple Vitamin (MULTIVITAMIN) tablet Take 1 tablet by mouth daily.     Marland Kitchen olmesartan-hydrochlorothiazide (BENICAR HCT) 20-12.5 MG tablet Take 1 tablet by mouth daily. 90 tablet 3  . pantoprazole (PROTONIX) 40 MG tablet Take 1 tablet (40 mg total) by mouth 2 (two) times daily before a meal. 60 tablet 2  . traMADol (ULTRAM) 50 MG tablet TAKE 1 TABLET BY MOUTH ONCE DAILY FOR UNCONTROLLED KNEE PAIN IF NEEDED 90 tablet 0  . traZODone (DESYREL) 50 MG tablet TAKE 1 TABLET BY MOUTH AT BEDTIME 90 tablet 1   No current facility-administered medications for this visit.    REVIEW OF SYSTEMS:   Constitutional: Denies fevers, chills or abnormal night sweats Eyes: Denies blurriness of vision, double vision or watery eyes Ears, nose, mouth, throat, and face: Denies mucositis or sore throat Respiratory: Denies cough, dyspnea or wheezes Cardiovascular: Denies palpitation, chest discomfort or lower extremity swelling Gastrointestinal:  Denies nausea, heartburn or change in bowel habits Skin: Denies abnormal skin rashes Lymphatics:  Denies new lymphadenopathy or easy bruising Neurological:Denies numbness, tingling or new weaknesses Behavioral/Psych: Mood is stable, no new changes  All other systems were reviewed with the patient and are negative.  PHYSICAL EXAMINATION: ECOG PERFORMANCE STATUS: 0 - Asymptomatic  Vitals:   10/28/19 1347  BP: 112/68  Pulse: 80  Resp: 18  Temp: (!) 97.5 F (36.4 C)  SpO2: 95%   Filed Weights   10/28/19 1347  Weight: 229 lb (103.9 kg)    GENERAL:alert, no distress and comfortable SKIN: skin color, texture, turgor are normal, no rashes or significant lesions EYES: normal, conjunctiva are pink and non-injected, sclera clear OROPHARYNX:no exudate, no erythema and lips, buccal mucosa, and tongue normal  NECK: supple, thyroid normal size, non-tender, without nodularity LYMPH:  no palpable lymphadenopathy in the cervical, axillary or inguinal LUNGS: clear to auscultation and percussion with normal breathing effort HEART: regular rate & rhythm and no murmurs and no lower extremity edema ABDOMEN:abdomen soft, non-tender and normal bowel sounds Musculoskeletal:no cyanosis of digits and no clubbing  PSYCH: alert & oriented x 3 with fluent speech NEURO: no focal motor/sensory deficits  LABORATORY DATA:  I have reviewed the data as listed Lab Results  Component Value Date   WBC 5.4 09/09/2019   HGB 13.9 09/09/2019   HCT 41.6 09/09/2019   MCV 86.8 09/09/2019   PLT 354 09/09/2019     Chemistry      Component Value Date/Time   NA 140 09/09/2019 1030   K 4.3 09/09/2019 1030   CL 106 09/09/2019 1030   CO2 29 09/09/2019 1030   BUN 12 09/09/2019 1030   CREATININE 0.80 09/09/2019 1030      Component Value Date/Time   CALCIUM 9.4 09/09/2019 1030   ALKPHOS 52 01/31/2018 1519   AST 23 09/09/2019 1030   ALT 25 09/09/2019 1030   BILITOT 0.5 09/09/2019 1030       RADIOGRAPHIC STUDIES: I have personally reviewed the radiological images as listed and agreed with the findings in  the report.  ASSESSMENT & PLAN:  Family history of cancer 1.  Extensive family history of malignancy: -Patient evaluated at the request of Dr. Moshe Cipro and Dr. Garwin Brothers. -She does not have any personal history of malignancy.  No B symptoms. -Did not have colonoscopy yet.  Had Pap smear recently which was normal. -Mammogram on 10/09/2019 was normal. -Family history significant for mother with breast cancer at age 56.  3 paternal aunts had breast cancer.  Paternal grandfather had lung cancer.  Maternal grandfather had prostate cancer.  Maternal grandmother had cervical cancer and died in her 46s.  Maternal aunt died of gallbladder cancer.  Multiple maternal second cousins had breast cancers. -She does not have any signs or symptoms of malignancy to do further work-up.  She has colonoscopy scheduled. -I will make a referral to our geneticist to see if she qualifies for genetic testing. -We will do a phone visit after genetic testing is resulted.  No orders of the defined types were placed in this encounter.   All questions were answered. The patient knows to call the clinic with any problems, questions or concerns.      Derek Jack, MD 10/28/2019 6:54 PM

## 2019-10-28 NOTE — Patient Instructions (Addendum)
[place] at [facility] Discharge Instructions  You were seen today by Dr. Delton Coombes. He discussed your family history with you.  We will schedule you to see a geneticist.  He will do a phone visit with you after your visit with the geneticist.   Thank you for choosing [place] at [facility] to provide your oncology and hematology care.  To afford each patient quality time with our provider, please arrive at least 15 minutes before your scheduled appointment time.   If you have a lab appointment with the [facility] please come in thru the  Main Entrance and check in at the main information desk  You need to re-schedule your appointment should you arrive 10 or more minutes late.  We strive to give you quality time with our providers, and arriving late affects you and other patients whose appointments are after yours.  Also, if you no show three or more times for appointments you may be dismissed from the clinic at the providers discretion.     Again, thank you for choosing [facility].  Our hope is that these requests will decrease the amount of time that you wait before being seen by our physicians.       _____________________________________________________________  Should you have questions after your visit to [facility], please contact our office at [phone] between the hours of 8:00 a.m. and 4:30 p.m.  Voicemails left after 4:00 p.m. will not be returned until the following business day.  For prescription refill requests, have your pharmacy contact our office and allow 72 hours.    Cancer Center Support Programs:   > Cancer Support Group  2nd Tuesday of the month 1pm-2pm, Journey Room

## 2019-11-06 ENCOUNTER — Encounter (INDEPENDENT_AMBULATORY_CARE_PROVIDER_SITE_OTHER): Payer: Self-pay | Admitting: *Deleted

## 2019-11-12 ENCOUNTER — Encounter: Payer: Self-pay | Admitting: Family Medicine

## 2019-11-12 ENCOUNTER — Inpatient Hospital Stay: Admission: RE | Admit: 2019-11-12 | Payer: 59 | Source: Ambulatory Visit

## 2019-11-12 ENCOUNTER — Other Ambulatory Visit: Payer: Self-pay

## 2019-11-12 ENCOUNTER — Telehealth: Payer: Self-pay | Admitting: Orthopedic Surgery

## 2019-11-12 NOTE — Telephone Encounter (Signed)
Kristine Lawson called today requesting to make an appointment with Dr Aline Brochure for both her knees and heel pain.  She asked if she needed a referral or if she could make an appointment herself.  I told her that she could indeed make an appointment for herself.  I told her that Dr. Aline Brochure did not have any openings tomorrow morning but I would be happy to schedule her for Friday.  She said she didn't feel that she could wait until Friday and was thinking of going to Urgent Care.  I told her if she changed her mind to let me know and I would be glad to schedule her here.

## 2019-11-13 ENCOUNTER — Ambulatory Visit: Payer: 59 | Admitting: Family Medicine

## 2019-11-13 ENCOUNTER — Encounter: Payer: Self-pay | Admitting: Family Medicine

## 2019-11-13 ENCOUNTER — Other Ambulatory Visit: Payer: Self-pay | Admitting: Family Medicine

## 2019-11-13 VITALS — BP 131/80 | HR 84 | Temp 97.7°F | Ht 68.0 in | Wt 226.6 lb

## 2019-11-13 DIAGNOSIS — M25562 Pain in left knee: Secondary | ICD-10-CM | POA: Diagnosis not present

## 2019-11-13 DIAGNOSIS — S46819A Strain of other muscles, fascia and tendons at shoulder and upper arm level, unspecified arm, initial encounter: Secondary | ICD-10-CM | POA: Insufficient documentation

## 2019-11-13 DIAGNOSIS — M779 Enthesopathy, unspecified: Secondary | ICD-10-CM | POA: Diagnosis not present

## 2019-11-13 DIAGNOSIS — M1990 Unspecified osteoarthritis, unspecified site: Secondary | ICD-10-CM

## 2019-11-13 HISTORY — DX: Strain of other muscles, fascia and tendons at shoulder and upper arm level, unspecified arm, initial encounter: S46.819A

## 2019-11-13 MED ORDER — PREDNISONE 10 MG PO TABS: 10.0000 mg | ORAL_TABLET | Freq: Two times a day (BID) | ORAL | 0 refills | Status: DC

## 2019-11-13 MED ORDER — CYCLOBENZAPRINE HCL 10 MG PO TABS: 10.0000 mg | ORAL_TABLET | Freq: Every day | ORAL | 1 refills | Status: DC

## 2019-11-13 MED FILL — CYCLOBENZAPRINE HCL 10 MG T: 10 | 90 days supply | Qty: 90 | Fill #0

## 2019-11-13 MED FILL — predniSONE 10 MG TABS: 10 | 5 days supply | Qty: 10 | Fill #0

## 2019-11-13 NOTE — Patient Instructions (Addendum)
   Use Voltaren as needed topically Pick up rx at pharmacy  If you have lab work done today you will be contacted with your lab results within the next 2 weeks.  If you have not heard from [place] then please contact us. The fastest way to get your results is to register for My Chart.   IF you received an x-ray today, you will receive an invoice from [facility]. Please contact [facility] at [phone] with questions or concerns regarding your invoice.   IF you received labwork today, you will receive an invoice from [facility]. Please contact [facility] at [phone] with questions or concerns regarding your invoice.   Our billing staff will not be able to assist you with questions regarding bills from these companies.  You will be contacted with the lab results as soon as they are available. The fastest way to get your results is to activate your My Chart account. Instructions are located on the last page of this paperwork. If you have not heard from [place] regarding the results in 2 weeks, please contact this office.

## 2019-11-13 NOTE — Progress Notes (Signed)
Acute Office Visit  Subjective:    Patient ID: Kristine Lawson, female    DOB: 03-26-1971, 49 y.o.   MRN: [id number]  Chief Complaint  Patient presents with  . Foot Pain    pain and swelling in right heel and both knees    HPI Patient is in today for tendonitis-stinging, burning onset-"even laying down" onset 2 weeks ago-pt states playing tennis with pain noted.  Pt with knee pain the week later.  Pt walking using a 30 minute video.  No biking or swimming.  Pt playing tennis 3 days/week, walk at track and "walk away the lbs"  No prior injections. Seen by Dr. Clide Deutscher recommendation for surgery.   Pt with gastric bypass 2016-using tylenol-ulcer noted on EGD.  Pt taking Naprosyn 500mg  /day.   Pt with no kidney concerns in the past-2/21 Cr-.8 , GFR-101 Vit D -weekly Upper back-onset 2 days ago-injury to lower back-MRI-no upper body change in activity IMPRESSION MRI-12/2003 1. 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  2. Please note numbering system as described. Recommend plain film correlation before intervention.      Past Medical History:  Diagnosis Date  . Allergy   . Anemia    IDA  . Arthritis   . Hypertension   . Insomnia    meds needed daily to achieve 6+ hours sleep.    Past Surgical History:  Procedure Laterality Date  . ABLATION     uterine ablation  . APPENDECTOMY    . BREATH TEK H PYLORI N/A 11/18/2014   Procedure: BREATH TEK H PYLORI;  Surgeon: Greer Pickerel, MD;  Location: [facility];  Service: General;  Laterality: N/A;  . CHOLECYSTECTOMY    . ESOPHAGOGASTRODUODENOSCOPY N/A 10/01/2018   Procedure: ESOPHAGOGASTRODUODENOSCOPY (EGD);  Surgeon: Rogene Houston, MD;  Location: [facility];  Service: Endoscopy;  Laterality: N/A;  . LAPAROSCOPIC ROUX-EN-Y GASTRIC BYPASS WITH HIATAL HERNIA REPAIR N/A 02/10/2015   Procedure: LAPAROSCOPIC ROUX-EN-Y GASTRIC BYPASS ;  Surgeon: Greer Pickerel, MD;   Location: [facility];  Service: General;  Laterality: N/A;  . TUBAL LIGATION    . UPPER GI ENDOSCOPY N/A 02/10/2015   Procedure: UPPER GI ENDOSCOPY;  Surgeon: Greer Pickerel, MD;  Location: [facility];  Service: General;  Laterality: N/A;    Family History  Problem Relation Age of Onset  . Hypertension Mother   . Diabetes Mother   . Kidney disease Mother   . Hyperlipidemia Mother   . Cancer Mother        breast  . Colon cancer Mother 62       highly suspious of colon mass  . Kidney disease Father   . Stroke Father   . Heart disease Father   . Hyperlipidemia Father   . Heart attack Father   . Diverticulitis Father   . Hypertension Brother   . Diabetes Brother   . Healthy Sister   . Other Daughter        hypertension  . Kidney disease Daughter   . Blindness Daughter   . Healthy Daughter   . Healthy Son   . Gallbladder disease Maternal Aunt   . Breast cancer Paternal Aunt   . Cervical cancer Maternal Grandmother   . Prostate cancer Maternal Grandfather   . Heart attack Paternal Grandmother   . Hypertension Paternal Grandmother   . Stroke Paternal Grandmother   . Lung cancer Paternal Grandfather   . Throat cancer  Paternal Grandfather   . Breast cancer Paternal Aunt   . Breast cancer Paternal Aunt     Social History   Socioeconomic History  . Marital status: Married    Spouse name: [name]  . Number of children: 4  . Years of education: Not on file  . Highest education level: Associate degree: occupational, Hotel manager, or vocational program  Occupational History  . Occupation: CARDIOLOGY    Employer: [employer]  Tobacco Use  . Smoking status: Former Smoker    Packs/day: 0.25    Years: 3.00    Pack years: 0.75    Quit date: 02/05/2007    Years since quitting: 12.7  . Smokeless tobacco: Never Used  Substance and Sexual Activity  . Alcohol use: Yes    Comment: occassional wine 2-3 times a year  . Drug use: No  . Sexual activity: Yes    Birth  control/protection: Surgical  Other Topics Concern  . Not on file  Social History Narrative   Lives [name] husband-10 years    4 kids blended together   3 biologically hers.      No pets in the home   Enjoy: playing tennis, watching games on tv , hgtv      Diet: well balanced, eats a lot of protein, and veggies, tries to avoid sweets and carbs   Caffeine: 2-3 cups daily   Water: 2.5 16 oz bottles      Wear seat beat    Doesn't wear sunscreen    Smoke and carbon monoxide detectors    Uses speaker phone while driving, avoids texting.    Social Determinants of Health   Financial Resource Strain: Low Risk   . Difficulty of Paying Living Expenses: Not hard at all  Food Insecurity: No Food Insecurity  . Worried About Charity fundraiser in the Last Year: Never true  . Ran Out of Food in the Last Year: Never true  Transportation Needs: No Transportation Needs  . Lack of Transportation (Medical): No  . Lack of Transportation (Non-Medical): No  Physical Activity: Insufficiently Active  . Days of Exercise per Week: 2 days  . Minutes of Exercise per Session: 40 min  Stress: No Stress Concern Present  . Feeling of Stress : Only a little  Social Connections: Slightly Isolated  . Frequency of Communication with Friends and Family: More than three times a week  . Frequency of Social Gatherings with Friends and Family: Twice a week  . Attends Religious Services: More than 4 times per year  . Active Member of Clubs or Organizations: No  . Attends Archivist Meetings: Never  . Marital Status: Married  Human resources officer Violence: Not At Risk  . Fear of Current or Ex-Partner: No  . Emotionally Abused: No  . Physically Abused: No  . Sexually Abused: No    Outpatient Medications Prior to Visit  Medication Sig Dispense Refill  . acyclovir (ZOVIRAX) 400 MG tablet Take 1 tablet (400 mg total) by mouth as needed. 90 tablet 1  . calcium gluconate 500 MG tablet Take 1 tablet by mouth  3 (three) times daily.     . clindamycin-benzoyl peroxide (BENZACLIN) gel Apply twice daily to face (Patient taking differently: as needed. ) 50 g 1  . diphenhydrAMINE (BENADRYL) 50 MG tablet Take 50 mg by mouth at bedtime as needed for itching.    . ergocalciferol (VITAMIN D2) 1.25 MG (50000 UT) capsule Take 1 capsule (50,000 Units total)  by mouth once a week. One capsule once weekly 12 capsule 1  . hydrOXYzine (ATARAX/VISTARIL) 50 MG tablet TAKE 3 TABLETS BY MOUTH AT BEDTIME 270 tablet 1  . ibuprofen (ADVIL,MOTRIN) 400 MG tablet Take 1 tablet (400 mg total) by mouth 2 (two) times daily as needed. 60 tablet 1  . Multiple Vitamin (MULTIVITAMIN) tablet Take 1 tablet by mouth daily.     Marland Kitchen olmesartan-hydrochlorothiazide (BENICAR HCT) 20-12.5 MG tablet Take 1 tablet by mouth daily. 90 tablet 3  . pantoprazole (PROTONIX) 40 MG tablet Take 1 tablet (40 mg total) by mouth 2 (two) times daily before a meal. 60 tablet 2  . traMADol (ULTRAM) 50 MG tablet TAKE 1 TABLET BY MOUTH ONCE DAILY FOR UNCONTROLLED KNEE PAIN IF NEEDED 90 tablet 0  . traZODone (DESYREL) 50 MG tablet TAKE 1 TABLET BY MOUTH AT BEDTIME 90 tablet 1   No facility-administered medications prior to visit.    Allergies  Allergen Reactions  . Lisinopril Cough  . Aspirin Hives    Goody Powder-  . Triamterene Other (See Comments)    Cramps     Review of Systems  Respiratory: Negative.   Cardiovascular: Negative.   Musculoskeletal: Positive for arthralgias, back pain, gait problem, joint swelling, myalgias and neck pain.       Objective:    Physical Exam Constitutional:      Appearance: Normal appearance.  Cardiovascular:     Rate and Rhythm: Normal rate and regular rhythm.     Pulses: Normal pulses.     Heart sounds: Normal heart sounds.  Pulmonary:     Effort: Pulmonary effort is normal.     Breath sounds: Normal breath sounds.  Musculoskeletal:        General: Tenderness present. No deformity.     Cervical back:  Tenderness present.     Right lower leg: No edema.     Left lower leg: No edema.     Comments: Knee pain with FROM Tenderness to palpation-achilles tendon-FROM  Neurological:     Mental Status: She is alert and oriented to person, place, and time.  Psychiatric:        Mood and Affect: Mood normal.        Behavior: Behavior normal.     BP 131/80 (BP Location: Right Arm, Patient Position: Sitting, Cuff Size: Large)   Pulse 84   Temp 97.7 F (36.5 C) (Temporal)   Ht 5\' 8"  (1.727 m)   Wt 226 lb 9.6 oz (102.8 kg)   SpO2 97%   BMI 34.45 kg/m  Wt Readings from Last 3 Encounters:  11/13/19 226 lb 9.6 oz (102.8 kg)  10/28/19 229 lb (103.9 kg)  06/05/19 217 lb (98.4 kg)     Lab Results  Component Value Date   TSH 0.57 09/09/2019   Lab Results  Component Value Date   WBC 5.4 09/09/2019   HGB 13.9 09/09/2019   HCT 41.6 09/09/2019   MCV 86.8 09/09/2019   PLT 354 09/09/2019   Lab Results  Component Value Date   NA 140 09/09/2019   K 4.3 09/09/2019   CO2 29 09/09/2019   GLUCOSE 84 09/09/2019   BUN 12 09/09/2019   CREATININE 0.80 09/09/2019   BILITOT 0.5 09/09/2019   ALKPHOS 52 01/31/2018   AST 23 09/09/2019   ALT 25 09/09/2019   PROT 7.5 09/09/2019   ALBUMIN 4.0 01/31/2018   CALCIUM 9.4 09/09/2019   ANIONGAP 10 01/31/2018   Lab Results  Component Value Date  CHOL 199 09/09/2019   Lab Results  Component Value Date   HDL 62 09/09/2019   Lab Results  Component Value Date   LDLCALC 118 (H) 09/09/2019   Lab Results  Component Value Date   TRIG 88 09/09/2019   Lab Results  Component Value Date   CHOLHDL 3.2 09/09/2019   Lab Results  Component Value Date   HGBA1C 5.6 03/23/2015       Assessment & Plan:   1. Acute pain of left knee Prednisone 10mg  BID x 5 days Ice, avoid tennis, exercise bike or aqua therapy to rehab 2. Tendinitis Prednisone 10mg  BID 5 days 3. Arthritis Concern for lateral movement with tennis -consider exercise bike, aqua therapy  , yoga for strength with less impact on knees, ankles  4. Strain of trapezius muscle, unspecified laterality, initial encounter Flexeril 10mg  -rx, heat to neck, Voltaren otc  22min discussion on exercise, medications-otc and rx-side effects/risk/benefit Fatima Fedie Hannah Beat, MD

## 2019-11-14 ENCOUNTER — Encounter: Payer: Self-pay | Admitting: Family Medicine

## 2019-11-21 ENCOUNTER — Ambulatory Visit (INDEPENDENT_AMBULATORY_CARE_PROVIDER_SITE_OTHER): Payer: Self-pay

## 2019-11-21 ENCOUNTER — Other Ambulatory Visit: Payer: Self-pay

## 2019-11-21 ENCOUNTER — Telehealth (INDEPENDENT_AMBULATORY_CARE_PROVIDER_SITE_OTHER): Payer: Self-pay | Admitting: *Deleted

## 2019-11-21 ENCOUNTER — Other Ambulatory Visit (INDEPENDENT_AMBULATORY_CARE_PROVIDER_SITE_OTHER): Payer: Self-pay | Admitting: *Deleted

## 2019-11-21 NOTE — Telephone Encounter (Signed)
Referring MD/PCP: simpson   Procedure: tcs/egd  Reason/Indication:  Screening, fam hx colon ca, gastric ulcer  Has patient had this procedure before?  No ([facility]), yes (EGD-09/2018)  If so, when, by whom and where?    Is there a family history of colon cancer?  Yes, mother  Who?  What age when diagnosed?    Is patient diabetic?   no      Does patient have prosthetic heart valve or mechanical valve?  no  Do you have a pacemaker/defibrillator?  no  Has patient ever had endocarditis/atrial fibrillation? no  Does patient use oxygen? no  Has patient had joint replacement within last 12 months?  no  Is patient constipated or do they take laxatives? no  Does patient have a history of alcohol/drug use?  no  Is patient on blood thinner such as Coumadin, Plavix and/or Aspirin? no  Medications: acyclovir 400 mg daily, prn, calcium 500 mg tid, clindamycin bid, vit d3 daily, hydroxyzine 50 mg tid, advill 400 mg bid prn, minocycline 50 mg bid, mvi daily, olmesartan/hctz 20/12.5 mg daily, pantoprazole 40 mg bid, flintstone vit bid, tramadol 50 mg 1 tab daily if needed for knee pain  Allergies: lisinopril, BC powder, triamterene  Medication Adjustment per Dr Laural Golden: flintstone vit 7 days  Procedure date & time: 12/18/19

## 2019-11-22 ENCOUNTER — Ambulatory Visit: Payer: 59 | Admitting: Orthopedic Surgery

## 2019-11-22 ENCOUNTER — Encounter (INDEPENDENT_AMBULATORY_CARE_PROVIDER_SITE_OTHER): Payer: Self-pay

## 2019-11-22 ENCOUNTER — Encounter (INDEPENDENT_AMBULATORY_CARE_PROVIDER_SITE_OTHER): Payer: Self-pay | Admitting: *Deleted

## 2019-11-22 NOTE — Telephone Encounter (Signed)
error    This encounter was created in error - please disregard.

## 2019-11-22 NOTE — Telephone Encounter (Signed)
Ok to schedule.

## 2019-11-26 ENCOUNTER — Encounter (INDEPENDENT_AMBULATORY_CARE_PROVIDER_SITE_OTHER): Payer: Self-pay

## 2019-11-26 MED FILL — OLMESARTAN-HCTZ 20-12.5 MG: 20-12.5 | 30 days supply | Qty: 30 | Fill #4

## 2019-12-05 ENCOUNTER — Inpatient Hospital Stay (HOSPITAL_COMMUNITY): Payer: 59 | Attending: Hematology | Admitting: Genetic Counselor

## 2019-12-05 ENCOUNTER — Inpatient Hospital Stay (HOSPITAL_COMMUNITY): Payer: 59

## 2019-12-05 ENCOUNTER — Encounter: Payer: Self-pay | Admitting: Genetic Counselor

## 2019-12-05 ENCOUNTER — Other Ambulatory Visit: Payer: Self-pay

## 2019-12-05 DIAGNOSIS — Z801 Family history of malignant neoplasm of trachea, bronchus and lung: Secondary | ICD-10-CM | POA: Diagnosis not present

## 2019-12-05 DIAGNOSIS — Z8049 Family history of malignant neoplasm of other genital organs: Secondary | ICD-10-CM | POA: Diagnosis not present

## 2019-12-05 DIAGNOSIS — Z803 Family history of malignant neoplasm of breast: Secondary | ICD-10-CM

## 2019-12-05 DIAGNOSIS — Z800 Family history of malignant neoplasm of digestive organs: Secondary | ICD-10-CM | POA: Diagnosis not present

## 2019-12-05 DIAGNOSIS — Z8042 Family history of malignant neoplasm of prostate: Secondary | ICD-10-CM

## 2019-12-05 NOTE — Progress Notes (Signed)
REFERRING PROVIDER: Derek Jack, MD [address] [place],  [place] [address]  PRIMARY PROVIDER:  Fayrene Helper, MD  PRIMARY REASON FOR VISIT:  1. Family history of breast cancer   2. Family history of prostate cancer   3. Family history of cancer of gallbladder   4. Family history of cervical cancer   5. Family history of lung cancer   6. Family history of throat cancer      I connected with Kristine Lawson on 12/05/2019 at 8:45am EDT by video conference and verified that I am speaking with the correct person using two identifiers.   Patient location: [facility] Provider location: [facility]  HISTORY OF PRESENT ILLNESS:   Kristine Lawson, a 49 y.o. female, was seen for a [place] cancer genetics consultation at the request of Dr. Delton Coombes due to a family history of cancer.  Kristine Lawson presents to clinic today to discuss the possibility of a hereditary predisposition to cancer, genetic testing, and to further clarify her future cancer risks, as well as potential cancer risks for family members.   Kristine Lawson does not have a personal history of cancer.     RISK FACTORS:  Menarche was at age 16.  First live birth at age 51.  OCP use for less than 1 year.  Ovaries intact: yes.  Hysterectomy: no.  Menopausal status: postmenopausal.  HRT use: 0 years. Colonoscopy: no; scheduled 12/18/19. Mammogram within the last year: yes. Number of breast biopsies: 0. Any excessive radiation exposure in the past: no   Past Medical History:  Diagnosis Date  . Allergy   . Anemia    IDA  . Arthritis   . Family history of breast cancer   . Family history of cancer of gallbladder   . Family history of cervical cancer   . Family history of lung cancer   . Family history of prostate cancer   . Family history of throat cancer   . Hypertension   . Insomnia    meds needed daily to achieve 6+ hours sleep.    Past Surgical History:  Procedure Laterality Date  . ABLATION      uterine ablation  . APPENDECTOMY    . BREATH TEK H PYLORI N/A 11/18/2014   Procedure: BREATH TEK H PYLORI;  Surgeon: Greer Pickerel, MD;  Location: [facility];  Service: General;  Laterality: N/A;  . CHOLECYSTECTOMY    . ESOPHAGOGASTRODUODENOSCOPY N/A 10/01/2018   Procedure: ESOPHAGOGASTRODUODENOSCOPY (EGD);  Surgeon: Rogene Houston, MD;  Location: [facility];  Service: Endoscopy;  Laterality: N/A;  . LAPAROSCOPIC ROUX-EN-Y GASTRIC BYPASS WITH HIATAL HERNIA REPAIR N/A 02/10/2015   Procedure: LAPAROSCOPIC ROUX-EN-Y GASTRIC BYPASS ;  Surgeon: Greer Pickerel, MD;  Location: [facility];  Service: General;  Laterality: N/A;  . TUBAL LIGATION    . UPPER GI ENDOSCOPY N/A 02/10/2015   Procedure: UPPER GI ENDOSCOPY;  Surgeon: Greer Pickerel, MD;  Location: [facility];  Service: General;  Laterality: N/A;    Social History   Socioeconomic History  . Marital status: Married    Spouse name: [name]  . Number of children: 4  . Years of education: Not on file  . Highest education level: Associate degree: occupational, Hotel manager, or vocational program  Occupational History  . Occupation: CARDIOLOGY    Employer: [employer]  Tobacco Use  . Smoking status: Former Smoker    Packs/day: 0.25    Years: 3.00    Pack years: 0.75  Quit date: 02/05/2007    Years since quitting: 12.8  . Smokeless tobacco: Never Used  Substance and Sexual Activity  . Alcohol use: Yes    Comment: occassional wine 2-3 times a year  . Drug use: No  . Sexual activity: Yes    Birth control/protection: Surgical  Other Topics Concern  . Not on file  Social History Narrative   Lives [name] husband-10 years    4 kids blended together   3 biologically hers.      No pets in the home   Enjoy: playing tennis, watching games on tv , hgtv      Diet: well balanced, eats a lot of protein, and veggies, tries to avoid sweets and carbs   Caffeine: 2-3 cups daily   Water: 2.5 16 oz bottles      Wear seat beat     Doesn't wear sunscreen    Smoke and carbon monoxide detectors    Uses speaker phone while driving, avoids texting.    Social Determinants of Health   Financial Resource Strain: Low Risk   . Difficulty of Paying Living Expenses: Not hard at all  Food Insecurity: No Food Insecurity  . Worried About Charity fundraiser in the Last Year: Never true  . Ran Out of Food in the Last Year: Never true  Transportation Needs: No Transportation Needs  . Lack of Transportation (Medical): No  . Lack of Transportation (Non-Medical): No  Physical Activity: Insufficiently Active  . Days of Exercise per Week: 2 days  . Minutes of Exercise per Session: 40 min  Stress: No Stress Concern Present  . Feeling of Stress : Only a little  Social Connections: Slightly Isolated  . Frequency of Communication with Friends and Family: More than three times a week  . Frequency of Social Gatherings with Friends and Family: Twice a week  . Attends Religious Services: More than 4 times per year  . Active Member of Clubs or Organizations: No  . Attends Archivist Meetings: Never  . Marital Status: Married     FAMILY HISTORY:  We obtained a detailed, 4-generation family history.  Significant diagnoses are listed below: Family History  Problem Relation Age of Onset  . Hypertension Mother   . Diabetes Mother   . Kidney disease Mother   . Hyperlipidemia Mother   . Colon cancer Mother 81       highly suspious of colon mass  . Breast cancer Mother 73  . Kidney disease Father   . Stroke Father   . Heart disease Father   . Hyperlipidemia Father   . Heart attack Father   . Diverticulitis Father   . Hypertension Brother   . Diabetes Brother   . Healthy Sister   . Other Daughter        hypertension  . Kidney disease Daughter   . Blindness Daughter   . Healthy Daughter   . Healthy Son   . Cancer Maternal Aunt 66       gallbladder cancer  . Breast cancer Paternal Aunt        dx. in her 48s  .  Cervical cancer Maternal Grandmother        dx. in her 26s  . Prostate cancer Maternal Grandfather        metastatic  . Heart attack Paternal Grandmother   . Hypertension Paternal Grandmother   . Stroke Paternal Grandmother   . Lung cancer Paternal Grandfather   . Throat cancer Paternal  Grandfather   . Breast cancer Paternal Aunt        dx. in her 29s  . Breast cancer Paternal Aunt        dx. in her 67s  . Throat cancer Paternal Uncle   . Lung cancer Paternal Uncle   . Breast cancer Other        dx. 88s or 60s (mother's first cousins x3)  . SIDS Maternal Aunt   . Prostate cancer Maternal Uncle        dx. in his 56s, not metastatic  . Prostate cancer Maternal Uncle        dx. in his 39s, not metastatic  . Breast cancer Cousin        dx. late 55s (paternal cousin)  . Breast cancer Other        dx. 40s/early 31s (maternal second cousins x2)   Kristine Lawson has two daughters (ages 64 and 66) and one son (age 4). She has one brother (age 10) and one sister (age 79). None of these family members have had cancer.  Kristine Lawson's mother died at the age of 48 and had a history of breast cancer diagnosed when she was 80 and a colon mass that was suspicious for malignancy when she was 73. A biopsy was never taken from the colon mass to confirm malignancy. Kristine Lawson had two maternal aunts and four maternal uncles. One aunt died as an infant from SIDS, and the other aunt died at the age of 10 from gallbladder cancer. Two of her maternal uncles had non-metastatic prostate cancer diagnosed in their 46s. She has a maternal first cousin who died from cancer in her 52s, although Kristine Lawson is not sure the type of cancer. Kristine Lawson's maternal grandmother died in her 20s from cervical cancer. Her maternal grandfather died in his early 30s from prostate cancer. Kristine Lawson notes that there is additional extended maternal family history of cancer - three of her mother's first cousins had breast  cancer in their 39s and 53s. Additionally, Kristine Lawson has two maternal second-cousins who had breast cancer, on in her 19s and the other in her early 69s.   Kristine Lawson father died at the age of 63 and did not have cancer. She had two paternal uncles and five paternal aunts. Three of her paternal aunts (all currently living) had breast cancer diagnosed in their 58s. One paternal uncle had throat and lung cancer when he was older than 90. Kristine Lawson has one paternal cousin who had breast cancer diagnosed in her late 59s. Her paternal grandmother died in her late 24s and did not have cancer. Her paternal grandfather died in his early 25s and had lung and throat cancer.   Kristine Lawson is unaware of previous family history of genetic testing for hereditary cancer risks. Patient's maternal ancestors are of [place] African and European descent, and paternal ancestors are of Sub-Saharan African descent. There is no reported Ashkenazi Jewish ancestry. There is no known consanguinity. She notes that two of her paternal aunts married members of her mother's family.  GENETIC COUNSELING ASSESSMENT: Kristine Lawson is a 49 y.o. female with a family history of young-onset breast cancer and pancreatic cancer, which is somewhat suggestive of a hereditary cancer syndrome and predisposition to cancer. We, therefore, discussed and recommended the following at today's visit.   DISCUSSION: We discussed that 5-10% of breast cancer is hereditary, with most cases associated with the BRCA1 and BRCA2 genes. There are other genes  that can be associated with hereditary breast cancer syndromes. These include ATM, CHEK2, PALB2, etc. We discussed that testing is beneficial for several reasons, including knowing about other cancer risks, identifying potential screening and risk-reduction options that may be appropriate, and to understand if other family members could be at risk for cancer and allow them to undergo genetic  testing.  We reviewed the characteristics, features and inheritance patterns of hereditary cancer syndromes. We also discussed genetic testing, including the appropriate family members to test, the process of testing, insurance coverage and turn-around-time for results. We discussed the implications of a negative, positive and/or variant of uncertain significant result. We recommended Kristine Lawson pursue genetic testing for the [employer].   The Multi-Cancer Panel offered by [facility] includes sequencing and/or deletion duplication testing of the following 85 genes: AIP, ALK, APC, ATM, AXIN2,BAP1,  BARD1, BLM, BMPR1A, BRCA1, BRCA2, BRIP1, CASR, CDC73, CDH1, CDK4, CDKN1B, CDKN1C, CDKN2A (p14ARF), CDKN2A (p16INK4a), CEBPA, CHEK2, CTNNA1, DICER1, DIS3L2, EGFR (c.2369C>T, p.Thr790Met variant only), EPCAM (Deletion/duplication testing only), FH, FLCN, GATA2, GPC3, GREM1 (Promoter region deletion/duplication testing only), HOXB13 (c.251G>A, p.Gly84Glu), HRAS, KIT, MAX, MEN1, MET, MITF (c.952G>A, p.Glu318Lys variant only), MLH1, MSH2, MSH3, MSH6, MUTYH, NBN, NF1, NF2, NTHL1, PALB2, PDGFRA, PHOX2B, PMS2, POLD1, POLE, POT1, PRKAR1A, PTCH1, PTEN, RAD50, RAD51C, RAD51D, RB1, RECQL4, RET, RNF43, RUNX1, SDHAF2, SDHA (sequence changes only), SDHB, SDHC, SDHD, SMAD4, SMARCA4, SMARCB1, SMARCE1, STK11, SUFU, TERC, TERT, TMEM127, TP53, TSC1, TSC2, VHL, WRN and WT1.    Based on Kristine Lawson's family history of cancer, she meets medical criteria for genetic testing. Despite that she meets criteria, she may still have an out of pocket cost. We discussed that if her out of pocket cost for testing is over $100, the laboratory contact her via text or email to let her know. If the out of pocket cost of testing is less than $100 she will be billed by the genetic testing laboratory.   We discussed that some people do not want to undergo genetic testing due to fear of genetic discrimination.  A federal law called the  Genetic Information Non-Discrimination Act (GINA) of 2008 helps protect individuals against genetic discrimination based on their genetic test results. It impacts both health insurance and employment. With health insurance, it protects against increased premiums, being kicked off insurance or being forced to take a test in order to be insured. For employment it protects against hiring, firing and promoting decisions based on genetic test results. Health status due to a cancer diagnosis is not protected under GINA. Additionally, life, disability, and long-term care insurance is not protected under GINA.   PLAN: After considering the risks, benefits, and limitations, Kristine Lawson provided informed consent to pursue genetic testing and the blood sample was sent to [facility] for analysis of the Multi-Cancer panel. Results should be available within approximately two-three weeks' time, at which point they will be disclosed by telephone to Ms. Mccauley, as will any additional recommendations warranted by these results. Ms. Oxendine will receive a summary of her genetic counseling visit and a copy of her results once available. This information will also be available in Epic.   Ms. Hansmann's questions were answered to her satisfaction today. Our contact information was provided should additional questions or concerns arise. Thank you for the referral and allowing [place] to share in the care of your patient.   Clint Guy, [place], [facility] Licensed, Certified Dispensing [email or web address] Phone: [phone]  The patient was seen for a total of 50 minutes in  face-to-face genetic counseling.  This patient was discussed with Drs. Magrinat, Lindi Adie and/or Burr Medico who agrees with the above.    _______________________________________________________________________ For Office Staff:  Number of people involved in session: 1 Was an Intern/ student involved with case: no

## 2019-12-15 MED ORDER — ACETAMINOPHEN 10 MG/ML IV SOLN
INTRAVENOUS | Status: AC
  Filled 2019-12-15: qty 100

## 2019-12-16 ENCOUNTER — Telehealth: Payer: Self-pay | Admitting: Genetic Counselor

## 2019-12-16 ENCOUNTER — Other Ambulatory Visit (HOSPITAL_COMMUNITY)
Admission: RE | Admit: 2019-12-16 | Discharge: 2019-12-16 | Disposition: A | Discharge status: Home / Self Care | Payer: 59 | Source: Ambulatory Visit | Attending: Internal Medicine | Admitting: Internal Medicine

## 2019-12-16 ENCOUNTER — Ambulatory Visit: Payer: Self-pay | Admitting: Genetic Counselor

## 2019-12-16 ENCOUNTER — Encounter: Payer: Self-pay | Admitting: Genetic Counselor

## 2019-12-16 ENCOUNTER — Other Ambulatory Visit: Payer: Self-pay

## 2019-12-16 DIAGNOSIS — Z1379 Encounter for other screening for genetic and chromosomal anomalies: Secondary | ICD-10-CM | POA: Insufficient documentation

## 2019-12-16 DIAGNOSIS — Z20822 Contact with and (suspected) exposure to covid-19: Secondary | ICD-10-CM | POA: Diagnosis not present

## 2019-12-16 DIAGNOSIS — Z01812 Encounter for preprocedural laboratory examination: Secondary | ICD-10-CM | POA: Diagnosis not present

## 2019-12-16 NOTE — Telephone Encounter (Signed)
Revealed negative genetic testing. Discussed that we do not know why there is cancer in the family. There could be a genetic mutation in the family that Kristine Lawson did not inherit. There could also be a mutation in a different gene that we are not testing, or our current technology may not be able detect certain mutations. It will therefore be important for her to stay in contact with genetics to keep up with whether additional testing may be appropriate in the future.   A variant of uncertain significance was detected in the ALK gene called c.2229C>T (Silent). Her result is still considered normal at this time and should not impact her medical management.

## 2019-12-16 NOTE — Progress Notes (Addendum)
HPI:  Kristine Lawson was previously seen in the [facility] due to a family history of cancer and concerns regarding a hereditary predisposition to cancer. Please refer to our prior cancer genetics clinic note for more information regarding our discussion, assessment and recommendations, at the time. Kristine Lawson's recent genetic test results were disclosed to her, as were recommendations warranted by these results. These results and recommendations are discussed in more detail below.  FAMILY HISTORY:  We obtained a detailed, 4-generation family history.  Significant diagnoses are listed below: Family History  Problem Relation Age of Onset   Hypertension Mother    Diabetes Mother    Kidney disease Mother    Hyperlipidemia Mother    Colon cancer Mother 82       highly suspious of colon mass   Breast cancer Mother 53   Kidney disease Father    Stroke Father    Heart disease Father    Hyperlipidemia Father    Heart attack Father    Diverticulitis Father    Hypertension Brother    Diabetes Brother    Healthy Sister    Other Daughter        hypertension   Kidney disease Daughter    Blindness Daughter    Healthy Daughter    Healthy Son    Cancer Maternal Aunt 66       gallbladder cancer   Breast cancer Paternal Aunt        dx. in her 71s   Cervical cancer Maternal Grandmother        dx. in her 16s   Prostate cancer Maternal Grandfather        metastatic   Heart attack Paternal Grandmother    Hypertension Paternal Grandmother    Stroke Paternal Grandmother    Lung cancer Paternal Grandfather    Throat cancer Paternal Grandfather    Breast cancer Paternal Aunt        dx. in her 33s   Breast cancer Paternal Aunt        dx. in her 64s   Throat cancer Paternal Uncle    Lung cancer Paternal Uncle    Breast cancer Other        dx. 5s or 70s (mother's first cousins x3)   SIDS Maternal Aunt    Prostate cancer Maternal Uncle        dx. in his 52s, not  metastatic   Prostate cancer Maternal Uncle        dx. in his 57s, not metastatic   Breast cancer Cousin        dx. late 74s (paternal cousin)   Breast cancer Other        dx. 40s/early 77s (maternal second cousins x2)     Kristine Lawson has two daughters (ages 24 and 40) and one son (age 87). She has one brother (age 53) and one sister (age 42). None of these family members have had cancer.   Kristine Lawson's mother died at the age of 41 and had a history of breast cancer diagnosed when she was 58 and a colon mass that was suspicious for malignancy when she was 60. A biopsy was never taken from the colon mass to confirm malignancy. Kristine Lawson had two maternal aunts and four maternal uncles. One aunt died as an infant from SIDS, and the other aunt died at the age of 2 from gallbladder cancer. Two of her maternal uncles had non-metastatic prostate cancer diagnosed in their 75s.  She has a maternal first cousin who died from cancer in her 53s, although Kristine Lawson is not sure the type of cancer. Kristine Lawson's maternal grandmother died in her 72s from cervical cancer. Her maternal grandfather died in his early 62s from prostate cancer. Kristine Lawson notes that there is additional extended maternal family history of cancer - three of her mother's first cousins had breast cancer in their 104s and 79s. Additionally, Kristine Lawson has two maternal second-cousins who had breast cancer, on in her 22s and the other in her early 23s.    Kristine Lawson father died at the age of 23 and did not have cancer. She had two paternal uncles and five paternal aunts. Three of her paternal aunts (all currently living) had breast cancer diagnosed in their 55s. One paternal uncle had throat and lung cancer when he was older than 6. Kristine Lawson has one paternal cousin who had breast cancer diagnosed in her late 97s. Her paternal grandmother died in her late 30s and did not have cancer. Her paternal grandfather died in his early  33s and had lung and throat cancer.    Kristine Lawson is unaware of previous family history of genetic testing for hereditary cancer risks. Patient's maternal ancestors are of [place] African and European descent, and paternal ancestors are of Sub-Saharan African descent. There is no reported Ashkenazi Jewish ancestry. There is no known consanguinity. She notes that two of her paternal aunts married members of her mother's family.  GENETIC TEST RESULTS: Genetic testing reported out on 12/13/2019 through the [facility] Multi-Cancer panel. No pathogenic variants were detected.   The Multi-Cancer Panel offered by [facility] includes sequencing and/or deletion duplication testing of the following 85 genes: AIP, ALK, APC, ATM, AXIN2,BAP1,  BARD1, BLM, BMPR1A, BRCA1, BRCA2, BRIP1, CASR, CDC73, CDH1, CDK4, CDKN1B, CDKN1C, CDKN2A (p14ARF), CDKN2A (p16INK4a), CEBPA, CHEK2, CTNNA1, DICER1, DIS3L2, EGFR (c.2369C>T, p.Thr790Met variant only), EPCAM (Deletion/duplication testing only), FH, FLCN, GATA2, GPC3, GREM1 (Promoter region deletion/duplication testing only), HOXB13 (c.251G>A, p.Gly84Glu), HRAS, KIT, MAX, MEN1, MET, MITF (c.952G>A, p.Glu318Lys variant only), MLH1, MSH2, MSH3, MSH6, MUTYH, NBN, NF1, NF2, NTHL1, PALB2, PDGFRA, PHOX2B, PMS2, POLD1, POLE, POT1, PRKAR1A, PTCH1, PTEN, RAD50, RAD51C, RAD51D, RB1, RECQL4, RET, RNF43, RUNX1, SDHAF2, SDHA (sequence changes only), SDHB, SDHC, SDHD, SMAD4, SMARCA4, SMARCB1, SMARCE1, STK11, SUFU, TERC, TERT, TMEM127, TP53, TSC1, TSC2, VHL, WRN and WT1. The test report will be scanned into EPIC and located under the Molecular Pathology section of the Results Review tab.  A portion of the result report is included below for reference.     We discussed with Kristine Lawson that because current genetic testing is not perfect, it is possible there may be a gene mutation in one of these genes that current testing cannot detect, but that chance is small.  We also discussed, that there  could be another gene that has not yet been discovered, or that we have not yet tested, that is responsible for the cancer diagnoses in the family. It is also possible there is a hereditary cause for the cancer in the family that Kristine Lawson did not inherit and therefore was not identified in her testing.  Therefore, it is important to remain in touch with cancer genetics in the future so that we can continue to offer Kristine Lawson the most up to date genetic testing.   Genetic testing did identify a variant of uncertain significance (VUS) in the ALK gene called c.2229C>T (Silent). At this time, it is unknown if this variant is  associated with increased cancer risk or if this is a normal finding, but most variants such as this get reclassified to being inconsequential. It should not be used to make medical management decisions. With time, we suspect the lab will determine the significance of this variant, if any. If we do learn more about it, we will try to contact Kristine Lawson to discuss it further. However, it is important to stay in touch with [place] periodically and keep the address and phone number up to date.  UPDATE:  The ALK c.2229C>T (Silent) VUS was reclassified to "Likely Benign" on 02/01/2021. The change in variant classification was made as a result of re-review of the evidence in light of new variant interpretation guidelines and/or new information.  CANCER SCREENING RECOMMENDATIONS: Kristine Lawson test result is considered negative (normal).  This means that we have not identified a hereditary cause for her family history of cancer at this time. While reassuring, this does not definitively rule out a hereditary predisposition to cancer. It is still possible that there could be genetic mutations that are undetectable by current technology. There could be genetic mutations in genes that have not been tested or identified to increase cancer risk.  Therefore, it is recommended she continue to follow the  cancer management and screening guidelines provided by her primary healthcare provider.   An individual's cancer risk and medical management are not determined by genetic test results alone. Overall cancer risk assessment incorporates additional factors, including personal medical history, family history, and any available genetic information that may result in a personalized plan for cancer prevention and surveillance.  Based on Kristine Lawson's personal and family history, as well as her genetic test results, a statistical model Midwife) was used to estimate her risk of developing breast cancer. Tyrer-Cuzick estimates her lifetime risk of developing breast cancer to be approximately 11.3% (calculated with breast density) to 20.5% (calculated without breast density). This lifetime breast cancer risk is a preliminary estimate based on available information using one of several models endorsed by the [employer] (ACS). The ACS recommends consideration of breast MRI screening as an adjunct to mammography for patients at high risk (defined as 20% or greater lifetime risk).   We discussed that because her lifetime risk may be up to 20.5%, it is reasonable to consider annual breast MRIs in addition to annual mammograms. Kristine Lawson should discuss her individual situation with her referring physician and determine a breast cancer screening plan with which they are both comfortable.      RECOMMENDATIONS FOR FAMILY MEMBERS:  Individuals in this family might be at some increased risk of developing cancer, over the general population risk, simply due to the family history of cancer.  We recommended women in this family have a yearly mammogram beginning at age 23, or 66 years younger than the earliest onset of cancer, an annual clinical breast exam, and perform monthly breast self-exams. Women in this family should also have a gynecological exam as recommended by their primary provider. All family  members should have a colonoscopy by age 29.  It is also possible there is a hereditary cause for the cancer in Ms. Breach's family that she did not inherit and therefore was not identified in her.  Based on Ms. Cabreja's family history, we recommended her relatives who have had breast cancer, particularly those diagnosed at younger ages, have genetic counseling and testing. Ms. Nicolaou will let us know if we can be of any assistance in coordinating genetic counseling  and/or testing for these family members.   FOLLOW-UP: Lastly, we discussed with Ms. Pat that cancer genetics is a rapidly advancing field and it is possible that new genetic tests will be appropriate for her and/or her family members in the future. We encouraged her to remain in contact with cancer genetics on an annual basis so we can update her personal and family histories and let her know of advances in cancer genetics that may benefit this family.   Our contact number was provided. Ms. Rowles's questions were answered to her satisfaction, and she knows she is welcome to call us at anytime with additional questions or concerns.   Clint Guy, MS, [facility] Genetic Counselor [email or web address] Phone: [phone]

## 2019-12-17 LAB — SARS CORONAVIRUS 2 (TAT 6-24 HRS): SARS Coronavirus 2: NEGATIVE

## 2019-12-18 ENCOUNTER — Encounter (HOSPITAL_COMMUNITY)
Admission: RE | Disposition: A | Discharge status: Home / Self Care | Payer: Self-pay | Source: Home / Self Care | Attending: Internal Medicine

## 2019-12-18 ENCOUNTER — Other Ambulatory Visit: Payer: Self-pay

## 2019-12-18 ENCOUNTER — Encounter (HOSPITAL_COMMUNITY): Payer: Self-pay | Admitting: Internal Medicine

## 2019-12-18 ENCOUNTER — Ambulatory Visit (HOSPITAL_COMMUNITY)
Admission: RE | Admit: 2019-12-18 | Discharge: 2019-12-18 | Disposition: A | Discharge status: Home / Self Care | Payer: 59 | Attending: Internal Medicine | Admitting: Internal Medicine

## 2019-12-18 DIAGNOSIS — Z1211 Encounter for screening for malignant neoplasm of colon: Secondary | ICD-10-CM | POA: Insufficient documentation

## 2019-12-18 DIAGNOSIS — Z888 Allergy status to other drugs, medicaments and biological substances status: Secondary | ICD-10-CM | POA: Diagnosis not present

## 2019-12-18 DIAGNOSIS — K5730 Diverticulosis of large intestine without perforation or abscess without bleeding: Secondary | ICD-10-CM | POA: Diagnosis not present

## 2019-12-18 DIAGNOSIS — G4700 Insomnia, unspecified: Secondary | ICD-10-CM | POA: Diagnosis not present

## 2019-12-18 DIAGNOSIS — Z980 Intestinal bypass and anastomosis status: Secondary | ICD-10-CM | POA: Insufficient documentation

## 2019-12-18 DIAGNOSIS — Z87891 Personal history of nicotine dependence: Secondary | ICD-10-CM | POA: Diagnosis not present

## 2019-12-18 DIAGNOSIS — Z886 Allergy status to analgesic agent status: Secondary | ICD-10-CM | POA: Insufficient documentation

## 2019-12-18 DIAGNOSIS — Z09 Encounter for follow-up examination after completed treatment for conditions other than malignant neoplasm: Secondary | ICD-10-CM | POA: Diagnosis not present

## 2019-12-18 DIAGNOSIS — Z79899 Other long term (current) drug therapy: Secondary | ICD-10-CM | POA: Insufficient documentation

## 2019-12-18 DIAGNOSIS — I10 Essential (primary) hypertension: Secondary | ICD-10-CM | POA: Diagnosis not present

## 2019-12-18 DIAGNOSIS — Z8711 Personal history of peptic ulcer disease: Secondary | ICD-10-CM | POA: Diagnosis not present

## 2019-12-18 DIAGNOSIS — K5180 Other ulcerative colitis without complications: Secondary | ICD-10-CM | POA: Diagnosis not present

## 2019-12-18 DIAGNOSIS — Z800 Family history of malignant neoplasm of digestive organs: Secondary | ICD-10-CM | POA: Insufficient documentation

## 2019-12-18 DIAGNOSIS — K289 Gastrojejunal ulcer, unspecified as acute or chronic, without hemorrhage or perforation: Secondary | ICD-10-CM | POA: Diagnosis not present

## 2019-12-18 DIAGNOSIS — K259 Gastric ulcer, unspecified as acute or chronic, without hemorrhage or perforation: Secondary | ICD-10-CM | POA: Diagnosis not present

## 2019-12-18 DIAGNOSIS — Z9884 Bariatric surgery status: Secondary | ICD-10-CM | POA: Insufficient documentation

## 2019-12-18 DIAGNOSIS — Z8249 Family history of ischemic heart disease and other diseases of the circulatory system: Secondary | ICD-10-CM | POA: Diagnosis not present

## 2019-12-18 DIAGNOSIS — M1990 Unspecified osteoarthritis, unspecified site: Secondary | ICD-10-CM | POA: Diagnosis not present

## 2019-12-18 HISTORY — PX: BIOPSY: SHX5522

## 2019-12-18 HISTORY — PX: COLONOSCOPY: SHX5424

## 2019-12-18 HISTORY — PX: ESOPHAGOGASTRODUODENOSCOPY: SHX5428

## 2019-12-18 SURGERY — COLONOSCOPY
Anesthesia: Moderate Sedation

## 2019-12-18 MED ORDER — STERILE WATER FOR IRRIGATION IR SOLN
Status: DC | PRN
  Administered 2019-12-18: 1.5 mL

## 2019-12-18 MED ORDER — LIDOCAINE VISCOUS HCL 2 % MT SOLN
OROMUCOSAL | Status: AC
  Filled 2019-12-18: qty 15

## 2019-12-18 MED ORDER — MEPERIDINE HCL 50 MG/ML IJ SOLN
INTRAMUSCULAR | Status: AC
  Filled 2019-12-18: qty 1

## 2019-12-18 MED ORDER — MEPERIDINE HCL 50 MG/ML IJ SOLN
INTRAMUSCULAR | Status: DC | PRN
  Administered 2019-12-18 (×3): 25 mg via INTRAVENOUS

## 2019-12-18 MED ORDER — MIDAZOLAM HCL 5 MG/5ML IJ SOLN
INTRAMUSCULAR | Status: AC
  Filled 2019-12-18: qty 10

## 2019-12-18 MED ORDER — SODIUM CHLORIDE 0.9 % IV SOLN: INTRAVENOUS | Status: DC

## 2019-12-18 MED ORDER — MIDAZOLAM HCL 5 MG/5ML IJ SOLN
INTRAMUSCULAR | Status: DC | PRN
  Administered 2019-12-18: 2 mg via INTRAVENOUS
  Administered 2019-12-18: 1 mg via INTRAVENOUS
  Administered 2019-12-18: 2 mg via INTRAVENOUS
  Administered 2019-12-18 (×2): 1 mg via INTRAVENOUS

## 2019-12-18 MED ORDER — LIDOCAINE VISCOUS HCL 2 % MT SOLN
OROMUCOSAL | Status: DC | PRN
  Administered 2019-12-18: 4 mL via OROMUCOSAL

## 2019-12-18 NOTE — Op Note (Signed)
[facility] Patient Name: Kristine Lawson Procedure Date: 12/18/2019 9:54 AM MRN: [id number] Date of Birth: [birth year] Attending MD: Hildred Laser , MD CSN: [id number] Age: 49 Admit Type: Outpatient Procedure:                Colonoscopy Indications:              Screening for colorectal malignant neoplasm,                            Screening in patient at increased risk: Colorectal                            cancer in mother 45 or older Providers:                Hildred Laser, MD, Otis Peak B. Sharon Seller, RN, Nelma Rothman, Technician Referring MD:             Norwood Levo. Moshe Cipro, MD Medicines:                Midazolam 1 mg IV Complications:            No immediate complications. Estimated Blood Loss:     Estimated blood loss: none. Procedure:                Pre-Anesthesia Assessment:                           - Prior to the procedure, a History and Physical                            was performed, and patient medications and                            allergies were reviewed. The patient's tolerance of                            previous anesthesia was also reviewed. The risks                            and benefits of the procedure and the sedation                            options and risks were discussed with the patient.                            All questions were answered, and informed consent                            was obtained. Prior Anticoagulants: The patient has                            taken no previous anticoagulant or antiplatelet  agents. ASA Grade Assessment: II - A patient with                            mild systemic disease. After reviewing the risks                            and benefits, the patient was deemed in                            satisfactory condition to undergo the procedure.                           After obtaining informed consent, the colonoscope                            was passed  under direct vision. Throughout the                            procedure, the patient's blood pressure, pulse, and                            oxygen saturations were monitored continuously. The                            [id number]-[id number]) scope was introduced through                            the anus and advanced to the the cecum, identified                            by appendiceal orifice and ileocecal valve. The                            colonoscopy was performed without difficulty. The                            patient tolerated the procedure well. The quality                            of the bowel preparation was good. The ileocecal                            valve, appendiceal orifice, and rectum were                            photographed. Scope In: 10:01:20 AM Scope Out: 10:16:12 AM Scope Withdrawal Time: 0 hours 8 minutes 6 seconds  Total Procedure Duration: 0 hours 14 minutes 52 seconds  Findings:      The perianal and digital rectal examinations were normal.      Scattered diverticula were found in the sigmoid colon.      Two small-mouthed diverticula were found in the transverse colon.      The retroflexed view of the distal rectum and anal verge was normal and  showed no anal or rectal abnormalities. Impression:               - Diverticulosis in the sigmoid colon.                           - Diverticulosis in the transverse colon.                           - No specimens collected. Moderate Sedation:      Moderate (conscious) sedation was administered by the endoscopy nurse       and supervised by the endoscopist. The following parameters were       monitored: oxygen saturation, heart rate, blood pressure, CO2       capnography and response to care. Total physician intraservice time was       22 minutes. Recommendation:           - Patient has a contact number available for                            emergencies. The signs and symptoms of potential                             delayed complications were discussed with the                            patient. Return to normal activities tomorrow.                            Written discharge instructions were provided to the                            patient.                           - High fiber diet today.                           - Continue present medications.                           - Repeat colonoscopy in 5 years for screening                            purposes. Procedure Code(s):        --- Professional ---                           [phone], Colonoscopy, flexible; diagnostic, including                            collection of specimen(s) by brushing or washing,                            when performed (separate procedure)                           G0500, Moderate sedation  services provided by the                            same physician or other qualified health care                            professional performing a gastrointestinal                            endoscopic service that sedation supports,                            requiring the presence of an independent trained                            observer to assist in the monitoring of the                            patient's level of consciousness and physiological                            status; initial 15 minutes of intra-service time;                            patient age 63 years or older (additional time may                            be reported with [phone], as appropriate) Diagnosis Code(s):        --- Professional ---                           Z12.11, Encounter for screening for malignant                            neoplasm of colon                           Z80.0, Family history of malignant neoplasm of                            digestive organs                           K57.30, Diverticulosis of large intestine without                            perforation or abscess without bleeding CPT copyright 2019  American Medical Association. All rights reserved. The codes documented in this report are preliminary and upon coder review may  be revised to meet current compliance requirements. Hildred Laser, MD Hildred Laser, MD 12/18/2019 10:35:05 AM This report has been signed electronically. Number of Addenda: 0

## 2019-12-18 NOTE — Discharge Instructions (Signed)
No aspirin or NSAIDs. Resume usual medications  as before. High-fiber diet. No driving for 24 hours. Physician will call with biopsy results. May consider next screening exam in 5 years.   Colonoscopy, Adult, Care After This sheet gives you information about how to care for yourself after your procedure. Your doctor may also give you more specific instructions. If you have problems or questions, call your doctor. What can I expect after the procedure? After the procedure, it is common to have:  A small amount of blood in your poop (stool) for 24 hours.  Some gas.  Mild cramping or bloating in your belly (abdomen). Follow these instructions at home: Eating and drinking   Drink enough fluid to keep your pee (urine) pale yellow.  Follow instructions from your doctor about what you cannot eat or drink.  Return to your normal diet as told by your doctor. Avoid heavy or fried foods that are hard to digest. Activity  Rest as told by your doctor.  Do not sit for a long time without moving. Get up to take short walks every 1-2 hours. This is important. Ask for help if you feel weak or unsteady.  Return to your normal activities as told by your doctor. Ask your doctor what activities are safe for you. To help cramping and bloating:   Try walking around.  Put heat on your belly as told by your doctor. Use the heat source that your doctor recommends, such as a moist heat pack or a heating pad. ? Put a towel between your skin and the heat source. ? Leave the heat on for 20-30 minutes. ? Remove the heat if your skin turns bright red. This is very important if you are unable to feel pain, heat, or cold. You may have a greater risk of getting burned. General instructions  For the first 24 hours after the procedure: ? Do not drive or use machinery. ? Do not sign important documents. ? Do not drink alcohol. ? Do your daily activities more slowly than normal. ? Eat foods that are soft  and easy to digest.  Take over-the-counter or prescription medicines only as told by your doctor.  Keep all follow-up visits as told by your doctor. This is important. Contact a doctor if:  You have blood in your poop 2-3 days after the procedure. Get help right away if:  You have more than a small amount of blood in your poop.  You see large clumps of tissue (blood clots) in your poop.  Your belly is swollen.  You feel like you may vomit (nauseous).  You vomit.  You have a fever.  You have belly pain that gets worse, and medicine does not help your pain. Summary  After the procedure, it is common to have a small amount of blood in your poop. You may also have mild cramping and bloating in your belly.  For the first 24 hours after the procedure, do not drive or use machinery, do not sign important documents, and do not drink alcohol.  Get help right away if you have a lot of blood in your poop, feel like you may vomit, have a fever, or have more belly pain. This information is not intended to replace advice given to you by your health care provider. Make sure you discuss any questions you have with your health care provider. Document Revised: 02/04/2019 Document Reviewed: 02/04/2019 Elsevier Patient Education  [place].  Upper Endoscopy, Adult, Care After This  sheet gives you information about how to care for yourself after your procedure. Your health care provider may also give you more specific instructions. If you have problems or questions, contact your health care provider. What can I expect after the procedure? After the procedure, it is common to have:  A sore throat.  Mild stomach pain or discomfort.  Bloating.  Nausea. Follow these instructions at home:   Follow instructions from your health care provider about what to eat or drink after your procedure.  Return to your normal activities as told by your health care provider. Ask your health care  provider what activities are safe for you.  Take over-the-counter and prescription medicines only as told by your health care provider.  Do not drive for 24 hours if you were given a sedative during your procedure.  Keep all follow-up visits as told by your health care provider. This is important. Contact a health care provider if you have:  A sore throat that lasts longer than one day.  Trouble swallowing. Get help right away if:  You vomit blood or your vomit looks like coffee grounds.  You have: ? A fever. ? Bloody, black, or tarry stools. ? A severe sore throat or you cannot swallow. ? Difficulty breathing. ? Severe pain in your chest or abdomen. Summary  After the procedure, it is common to have a sore throat, mild stomach discomfort, bloating, and nausea.  Do not drive for 24 hours if you were given a sedative during the procedure.  Follow instructions from your health care provider about what to eat or drink after your procedure.  Return to your normal activities as told by your health care provider. This information is not intended to replace advice given to you by your health care provider. Make sure you discuss any questions you have with your health care provider. Document Revised: 01/02/2018 Document Reviewed: 12/11/2017 Elsevier Patient Education  [place].  High-Fiber Diet Fiber, also called dietary fiber, is a type of carbohydrate that is found in fruits, vegetables, whole grains, and beans. A high-fiber diet can have many health benefits. Your health care provider may recommend a high-fiber diet to help:  Prevent constipation. Fiber can make your bowel movements more regular.  Lower your cholesterol.  Relieve the following conditions: ? Swelling of veins in the anus (hemorrhoids). ? Swelling and irritation (inflammation) of specific areas of the digestive tract (uncomplicated diverticulosis). ? A problem of the large intestine (colon) that  sometimes causes pain and diarrhea (irritable bowel syndrome, IBS).  Prevent overeating as part of a weight-loss plan.  Prevent heart disease, type 2 diabetes, and certain cancers. What is my plan? The recommended daily fiber intake in grams (g) includes:  38 g for men age 34 or younger.  30 g for men over age 26.  34 g for women age 64 or younger.  21 g for women over age 81. You can get the recommended daily intake of dietary fiber by:  Eating a variety of fruits, vegetables, grains, and beans.  Taking a fiber supplement, if it is not possible to get enough fiber through your diet. What do I need to know about a high-fiber diet?  It is better to get fiber through food sources rather than from fiber supplements. There is not a lot of research about how effective supplements are.  Always check the fiber content on the nutrition facts label of any prepackaged food. Look for foods that contain 5 g of  fiber or more per serving.  Talk with a diet and nutrition specialist (dietitian) if you have questions about specific foods that are recommended or not recommended for your medical condition, especially if those foods are not listed below.  Gradually increase how much fiber you consume. If you increase your intake of dietary fiber too quickly, you may have bloating, cramping, or gas.  Drink plenty of water. Water helps you to digest fiber. What are tips for following this plan?  Eat a wide variety of high-fiber foods.  Make sure that half of the grains that you eat each day are whole grains.  Eat breads and cereals that are made with whole-grain flour instead of refined flour or white flour.  Eat brown rice, bulgur wheat, or millet instead of white rice.  Start the day with a breakfast that is high in fiber, such as a cereal that contains 5 g of fiber or more per serving.  Use beans in place of meat in soups, salads, and pasta dishes.  Eat high-fiber snacks, such as berries,  raw vegetables, nuts, and popcorn.  Choose whole fruits and vegetables instead of processed forms like juice or sauce. What foods can I eat?  Fruits Berries. Pears. Apples. Oranges. Avocado. Prunes and raisins. Dried figs. Vegetables Sweet potatoes. Spinach. Kale. Artichokes. Cabbage. Broccoli. Cauliflower. Green peas. Carrots. Squash. Grains Whole-grain breads. Multigrain cereal. Oats and oatmeal. Brown rice. Barley. Bulgur wheat. [place]. Quinoa. Bran muffins. Popcorn. Rye wafer crackers. Meats and other proteins Navy, kidney, and pinto beans. Soybeans. Split peas. Lentils. Nuts and seeds. Dairy Fiber-fortified yogurt. Beverages Fiber-fortified soy milk. Fiber-fortified orange juice. Other foods Fiber bars. The items listed above may not be a complete list of recommended foods and beverages. Contact a dietitian for more options. What foods are not recommended? Fruits Fruit juice. Cooked, strained fruit. Vegetables Fried potatoes. Canned vegetables. Well-cooked vegetables. Grains White bread. Pasta made with refined flour. White rice. Meats and other proteins Fatty cuts of meat. Fried chicken or fried fish. Dairy Milk. Yogurt. Cream cheese. Sour cream. Fats and oils Butters. Beverages Soft drinks. Other foods Cakes and pastries. The items listed above may not be a complete list of foods and beverages to avoid. Contact a dietitian for more information. Summary  Fiber is a type of carbohydrate. It is found in fruits, vegetables, whole grains, and beans.  There are many health benefits of eating a high-fiber diet, such as preventing constipation, lowering blood cholesterol, helping with weight loss, and reducing your risk of heart disease, diabetes, and certain cancers.  Gradually increase your intake of fiber. Increasing too fast can result in cramping, bloating, and gas. Drink plenty of water while you increase your fiber.  The best sources of fiber include whole fruits  and vegetables, whole grains, nuts, seeds, and beans. This information is not intended to replace advice given to you by your health care provider. Make sure you discuss any questions you have with your health care provider. Document Revised: 05/15/2017 Document Reviewed: 05/15/2017 Elsevier Patient Education  [place].  Diverticulosis  Diverticulosis is a condition that develops when small pouches (diverticula) form in the wall of the large intestine (colon). The colon is where water is absorbed and stool (feces) is formed. The pouches form when the inside layer of the colon pushes through weak spots in the outer layers of the colon. You may have a few pouches or many of them. The pouches usually do not cause problems unless they become inflamed or  infected. When this happens, the condition is called diverticulitis. What are the causes? The cause of this condition is not known. What increases the risk? The following factors may make you more likely to develop this condition:  Being older than age 30. Your risk for this condition increases with age. Diverticulosis is rare among people younger than age 56. By age 46, many people have it.  Eating a low-fiber diet.  Having frequent constipation.  Being overweight.  Not getting enough exercise.  Smoking.  Taking over-the-counter pain medicines, like aspirin and ibuprofen.  Having a family history of diverticulosis. What are the signs or symptoms? In most people, there are no symptoms of this condition. If you do have symptoms, they may include:  Bloating.  Cramps in the abdomen.  Constipation or diarrhea.  Pain in the lower left side of the abdomen. How is this diagnosed? Because diverticulosis usually has no symptoms, it is most often diagnosed during an exam for other colon problems. The condition may be diagnosed by:  Using a flexible scope to examine the colon (colonoscopy).  Taking an X-ray of the colon after  dye has been put into the colon (barium enema).  Having a CT scan. How is this treated? You may not need treatment for this condition. Your health care provider may recommend treatment to prevent problems. You may need treatment if you have symptoms or if you previously had diverticulitis. Treatment may include:  Eating a high-fiber diet.  Taking a fiber supplement.  Taking a live bacteria supplement (probiotic).  Taking medicine to relax your colon. Follow these instructions at home: Medicines  Take over-the-counter and prescription medicines only as told by your health care provider.  If told by your health care provider, take a fiber supplement or probiotic. Constipation prevention Your condition may cause constipation. To prevent or treat constipation, you may need to:  Drink enough fluid to keep your urine pale yellow.  Take over-the-counter or prescription medicines.  Eat foods that are high in fiber, such as beans, whole grains, and fresh fruits and vegetables.  Limit foods that are high in fat and processed sugars, such as fried or sweet foods.  General instructions  Try not to strain when you have a bowel movement.  Keep all follow-up visits as told by your health care provider. This is important. Contact a health care provider if you:  Have pain in your abdomen.  Have bloating.  Have cramps.  Have not had a bowel movement in 3 days. Get help right away if:  Your pain gets worse.  Your bloating becomes very bad.  You have a fever or chills, and your symptoms suddenly get worse.  You vomit.  You have bowel movements that are bloody or black.  You have bleeding from your rectum. Summary  Diverticulosis is a condition that develops when small pouches (diverticula) form in the wall of the large intestine (colon).  You may have a few pouches or many of them.  This condition is most often diagnosed during an exam for other colon problems.  Treatment  may include increasing the fiber in your diet, taking supplements, or taking medicines. This information is not intended to replace advice given to you by your health care provider. Make sure you discuss any questions you have with your health care provider. Document Revised: 02/07/2019 Document Reviewed: 02/07/2019 Elsevier Patient Education  [place].

## 2019-12-18 NOTE — H&P (Signed)
Kristine Lawson is an 49 y.o. female.   Chief Complaint: Patient is here for esophagogastroduodenoscopy and colonoscopy. HPI: Patient is 49 year old African-American female who was found to have iron deficiency anemia last year. She responded to iron infusion. Past history significant for bariatric surgery. She underwent EGD in March 2020 which revealed gastric ulcer. Colonoscopy was delayed because of this finding. EGD unfortunately could not be performed earlier because of Covid. She is not taking [place] chewable with iron daily. Her hemoglobin has returned to normal. She is not having abdominal pain anymore. She is undergoing EGD to document healing of gastric ulcer. She is also undergoing screening colonoscopy. Family history significant for colon carcinoma in mother who died at age 50. Her maternal aunt had gallbladder carcinoma. Patient does not take OTC NSAIDs anymore.  Past Medical History:  Diagnosis Date  . Allergy   . Anemia    IDA  . Arthritis   .    .    .                . Hypertension   . Insomnia    meds needed daily to achieve 6+ hours sleep.    Past Surgical History:  Procedure Laterality Date  . ABLATION     uterine ablation  . APPENDECTOMY    . BREATH TEK H PYLORI N/A 11/18/2014   Procedure: BREATH TEK H PYLORI;  Surgeon: Greer Pickerel, MD;  Location: [facility];  Service: General;  Laterality: N/A;  . CHOLECYSTECTOMY    . ESOPHAGOGASTRODUODENOSCOPY N/A 10/01/2018   Procedure: ESOPHAGOGASTRODUODENOSCOPY (EGD);  Surgeon: Rogene Houston, MD;  Location: [facility];  Service: Endoscopy;  Laterality: N/A;  . LAPAROSCOPIC ROUX-EN-Y GASTRIC BYPASS WITH HIATAL HERNIA REPAIR N/A 02/10/2015   Procedure: LAPAROSCOPIC ROUX-EN-Y GASTRIC BYPASS ;  Surgeon: Greer Pickerel, MD;  Location: [facility];  Service: General;  Laterality: N/A;  . TUBAL LIGATION    . UPPER GI ENDOSCOPY N/A 02/10/2015   Procedure: UPPER GI ENDOSCOPY;  Surgeon: Greer Pickerel, MD;  Location: [facility];  Service:  General;  Laterality: N/A;    Family History  Problem Relation Age of Onset  . Hypertension Mother   . Diabetes Mother   . Kidney disease Mother   . Hyperlipidemia Mother   . Colon cancer Mother 37       highly suspious of colon mass  . Breast cancer Mother 18  . Kidney disease Father   . Stroke Father   . Heart disease Father   . Hyperlipidemia Father   . Heart attack Father   . Diverticulitis Father   . Hypertension Brother   . Diabetes Brother   . Healthy Sister   . Other Daughter        hypertension  . Kidney disease Daughter   . Blindness Daughter   . Healthy Daughter   . Healthy Son   . Cancer Maternal Aunt 66       gallbladder cancer  . Breast cancer Paternal Aunt        dx. in her 67s  . Cervical cancer Maternal Grandmother        dx. in her 53s  . Prostate cancer Maternal Grandfather        metastatic  . Heart attack Paternal Grandmother   . Hypertension Paternal Grandmother   . Stroke Paternal Grandmother   . Lung cancer Paternal Grandfather   . Throat cancer Paternal Grandfather   . Breast cancer Paternal Aunt  dx. in her 64s  . Breast cancer Paternal Aunt        dx. in her 54s  . Throat cancer Paternal Uncle   . Lung cancer Paternal Uncle   . Breast cancer Other        dx. 56s or 60s (mother's first cousins x3)  . SIDS Maternal Aunt   . Prostate cancer Maternal Uncle        dx. in his 39s, not metastatic  . Prostate cancer Maternal Uncle        dx. in his 73s, not metastatic  . Breast cancer Cousin        dx. late 61s (paternal cousin)  . Breast cancer Other        dx. 40s/early 50s (maternal second cousins x2)   Social History:  reports that she quit smoking about 12 years ago. She has a 0.75 pack-year smoking history. She has never used smokeless tobacco. She reports current alcohol use. She reports that she does not use drugs.  Allergies:  Allergies  Allergen Reactions  . Lisinopril Cough  . Aspirin Hives    Goody Powder-  .  Triamterene Other (See Comments)    Cramps     Medications Prior to Admission  Medication Sig Dispense Refill  . acetaminophen (TYLENOL) 500 MG tablet Take 1,000 mg by mouth every 6 (six) hours as needed for moderate pain.    . calcium gluconate 500 MG tablet Take 1 tablet by mouth 3 (three) times daily.     . cyclobenzaprine (FLEXERIL) 10 MG tablet Take 1 tablet (10 mg total) by mouth at bedtime. (Patient taking differently: Take 10 mg by mouth at bedtime as needed for muscle spasms. ) 90 tablet 1  . diphenhydrAMINE (BENADRYL) 50 MG tablet Take 50 mg by mouth at bedtime as needed for itching.    . ergocalciferol (VITAMIN D2) 1.25 MG (50000 UT) capsule Take 1 capsule (50,000 Units total) by mouth once a week. One capsule once weekly 12 capsule 1  . hydrOXYzine (ATARAX/VISTARIL) 50 MG tablet TAKE 3 TABLETS BY MOUTH AT BEDTIME (Patient taking differently: Take 75 mg by mouth at bedtime. ) 270 tablet 1  . Multiple Vitamin (MULTIVITAMIN) tablet Take 1 tablet by mouth daily.     Marland Kitchen olmesartan-hydrochlorothiazide (BENICAR HCT) 20-12.5 MG tablet Take 1 tablet by mouth daily. 90 tablet 3  . pantoprazole (PROTONIX) 40 MG tablet Take 1 tablet (40 mg total) by mouth 2 (two) times daily before a meal. 60 tablet 2  . traZODone (DESYREL) 50 MG tablet TAKE 1 TABLET BY MOUTH AT BEDTIME (Patient taking differently: Take 50 mg by mouth at bedtime. ) 90 tablet 1  . acyclovir (ZOVIRAX) 400 MG tablet Take 1 tablet (400 mg total) by mouth as needed. (Patient taking differently: Take 400 mg by mouth 2 (two) times daily as needed (fever blisters). ) 90 tablet 1  . clindamycin-benzoyl peroxide (BENZACLIN) gel Apply twice daily to face (Patient taking differently: Apply 1 application topically daily as needed (acne). ) 50 g 1  . traMADol (ULTRAM) 50 MG tablet TAKE 1 TABLET BY MOUTH ONCE DAILY FOR UNCONTROLLED KNEE PAIN IF NEEDED (Patient taking differently: Take 50 mg by mouth daily as needed for severe pain. TAKE 1 TABLET  BY MOUTH ONCE DAILY FOR UNCONTROLLED KNEE PAIN IF NEEDED) 90 tablet 0    Results for orders placed or performed during the hospital encounter of 12/16/19 (from the past 48 hour(s))  SARS CORONAVIRUS 2 (TAT 6-24 HRS) Nasopharyngeal  Nasopharyngeal Swab     Status: None   Collection Time: 12/16/19  3:00 PM   Specimen: Nasopharyngeal Swab  Result Value Ref Range   SARS Coronavirus 2 NEGATIVE NEGATIVE    Comment: (NOTE) SARS-CoV-2 target nucleic acids are NOT DETECTED. The SARS-CoV-2 RNA is generally detectable in upper and lower respiratory specimens during the acute phase of infection. Negative results do not preclude SARS-CoV-2 infection, do not rule out co-infections with other pathogens, and should not be used as the sole basis for treatment or other patient management decisions. Negative results must be combined with clinical observations, patient history, and epidemiological information. The expected result is Negative. Fact Sheet for Patients: SugarRoll.be Fact Sheet for Healthcare Providers: https://www.woods-mathews.com/ This test is not yet approved or cleared by the [place] FDA and  has been authorized for detection and/or diagnosis of SARS-CoV-2 by FDA under an Emergency Use Authorization (EUA). This EUA will remain  in effect (meaning this test can be used) for the duration of the COVID-19 declaration under Section 56 4(b)(1) of the Act, 21 U.S.C. section 360bbb-3(b)(1), unless the authorization is terminated or revoked sooner. Performed at [facility], [place] [address] [place], [place] [address]    No results found.  Review of Systems  Blood pressure 130/79, pulse 73, temperature 98.2 F (36.8 C), temperature source Oral, resp. rate 20, height 5\' 8"  (1.727 m), SpO2 100 %. Physical Exam  Constitutional: She appears well-developed and well-nourished.  HENT:  Mouth/Throat: Oropharynx is clear and moist.  Eyes:  Conjunctivae are normal. No scleral icterus.  Neck: No thyromegaly present.  Cardiovascular: Normal rate, regular rhythm and normal heart sounds.  No murmur heard. Respiratory: Effort normal and breath sounds normal.  GI:  Abdomen is full. She has appendectomy and laparoscopy scars. Abdomen is soft and nontender with organomegaly or masses.  Musculoskeletal:        General: No edema.  Lymphadenopathy:    She has no cervical adenopathy.  Neurological: She is alert.  Skin: Skin is warm and dry.     Assessment/Plan  History of gastric ulcer. History of colon carcinoma in her mother. Diagnostic esophagogastroduodenoscopy and screening colonoscopy.  Hildred Laser, MD 12/18/2019, 9:35 AM

## 2019-12-18 NOTE — Op Note (Signed)
[facility] Patient Name: Kristine Lawson Procedure Date: 12/18/2019 9:26 AM MRN: [id number] Date of Birth: 07-08-1971 Attending MD: Hildred Laser , MD CSN: [id number] Age: 49 Admit Type: Outpatient Procedure:                Upper GI endoscopy Indications:              Follow-up of gastrojejunal ulcer Providers:                Hildred Laser, MD, Otis Peak B. Sharon Seller, RN, Nelma Rothman, Technician Referring MD:             Norwood Levo. Moshe Cipro, MD Medicines:                Lidocaine spray, Meperidine 75 mg IV, Midazolam 6                            mg IV Complications:            No immediate complications. Estimated Blood Loss:     Estimated blood loss was minimal. Procedure:                Pre-Anesthesia Assessment:                           - Prior to the procedure, a History and Physical                            was performed, and patient medications and                            allergies were reviewed. The patient's tolerance of                            previous anesthesia was also reviewed. The risks                            and benefits of the procedure and the sedation                            options and risks were discussed with the patient.                            All questions were answered, and informed consent                            was obtained. Prior Anticoagulants: The patient has                            taken no previous anticoagulant or antiplatelet                            agents. ASA Grade Assessment: II - A patient with  mild systemic disease. After reviewing the risks                            and benefits, the patient was deemed in                            satisfactory condition to undergo the procedure.                           After obtaining informed consent, the endoscope was                            passed under direct vision. Throughout the                            procedure,  the patient's blood pressure, pulse, and                            oxygen saturations were monitored continuously. The                            [id number]) was introduced through the                            mouth, and advanced to the mid-jejunum. The upper                            GI endoscopy was accomplished without difficulty.                            The patient tolerated the procedure well. Scope In: 9:47:25 AM Scope Out: 9:54:28 AM Total Procedure Duration: 0 hours 7 minutes 3 seconds  Findings:      The hypopharynx was normal.      The examined esophagus was normal.      The Z-line was regular and was found 41 cm from the incisors.      Evidence of a Roux-en-Y gastrojejunostomy was found. The gastrojejunal       anastomosis was characterized by congestion. This was traversed.      Normal mucosa was found in the gastric body.      Two non-bleeding superficial ulcers with no stigmata of bleeding were       found distal to the gastrojejunal anastomosis. The largest lesion was 15       mm in largest dimension. Biopsies were taken with a cold forceps for       histology. The pathology specimen was placed into Bottle Number 1.      The examined jejunum was normal. Impression:               - Normal hypopharynx.                           - Normal esophagus.                           - Z-line regular, 41 cm from the incisors.                           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                           - Normal mucosa was found in the gastric body.                           - Non-bleeding jejunal ulcers just distal to                            gastrojejunal anastamosis with no stigmata of                            bleeding Biopsied.                           - Normal examined jejunum.                           comment: ulcer noted on EGD 14 months ago has                             healed. Moderate Sedation:      Moderate (conscious) sedation was administered by the endoscopy nurse       and supervised by the endoscopist. The following parameters were       monitored: oxygen saturation, heart rate, blood pressure, CO2       capnography and response to care. Total physician intraservice time was       13 minutes. Recommendation:           - Patient has a contact number available for                            emergencies. The signs and symptoms of potential                            delayed complications were discussed with the                            patient. Return to normal activities tomorrow.                            Written discharge instructions were provided to the                            patient.                           - Await pathology results.                           - See the other procedure note for documentation of  additional recommendations. Procedure Code(s):        --- Professional ---                           [id number], Esophagogastroduodenoscopy, flexible,                            transoral; with biopsy, single or multiple                           G0500, Moderate sedation services provided by the                            same physician or other qualified health care                            professional performing a gastrointestinal                            endoscopic service that sedation supports,                            requiring the presence of an independent trained                            observer to assist in the monitoring of the                            patient's level of consciousness and physiological                            status; initial 15 minutes of intra-service time;                            patient age 58 years or older (additional time may                            be reported with [phone], as appropriate) Diagnosis Code(s):        --- Professional ---                            Z98.0, Intestinal bypass and anastomosis status                           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 All rights reserved. The codes documented in this report are preliminary and upon coder review may  be revised to meet current compliance requirements. Hildred Laser, MD Hildred Laser, MD 12/18/2019 10:30:52 AM This report has been signed electronically. Number of Addenda: 0

## 2019-12-19 LAB — SURGICAL PATHOLOGY

## 2019-12-20 ENCOUNTER — Other Ambulatory Visit (INDEPENDENT_AMBULATORY_CARE_PROVIDER_SITE_OTHER): Payer: Self-pay | Admitting: Internal Medicine

## 2019-12-25 ENCOUNTER — Encounter (INDEPENDENT_AMBULATORY_CARE_PROVIDER_SITE_OTHER): Payer: Self-pay

## 2019-12-25 ENCOUNTER — Other Ambulatory Visit (INDEPENDENT_AMBULATORY_CARE_PROVIDER_SITE_OTHER): Payer: Self-pay | Admitting: Internal Medicine

## 2019-12-25 MED FILL — PANTOPRAZOLE SOD DR 40 MG T: 40 | 90 days supply | Qty: 180 | Fill #0

## 2019-12-25 MED FILL — OLMESARTAN-HCTZ 20-12.5 MG: 20-12.5 | 30 days supply | Qty: 30 | Fill #5

## 2019-12-30 ENCOUNTER — Ambulatory Visit (HOSPITAL_COMMUNITY): Payer: 59 | Admitting: Hematology

## 2020-01-06 MED ORDER — PANTOPRAZOLE SODIUM 40 MG PO TBEC: 40.0000 mg | DELAYED_RELEASE_TABLET | Freq: Every day | ORAL | 5 refills | Status: DC

## 2020-01-07 DIAGNOSIS — L700 Acne vulgaris: Secondary | ICD-10-CM | POA: Diagnosis not present

## 2020-01-07 MED FILL — SULFAMETHOXAZOLE-TMP DS TAB: 800-160 | 30 days supply | Qty: 60 | Fill #0

## 2020-01-13 ENCOUNTER — Other Ambulatory Visit: Payer: Self-pay

## 2020-01-13 ENCOUNTER — Ambulatory Visit (INDEPENDENT_AMBULATORY_CARE_PROVIDER_SITE_OTHER): Payer: 59 | Admitting: Family Medicine

## 2020-01-13 ENCOUNTER — Encounter: Payer: Self-pay | Admitting: Family Medicine

## 2020-01-13 ENCOUNTER — Other Ambulatory Visit: Payer: Self-pay | Admitting: *Deleted

## 2020-01-13 VITALS — BP 125/84 | HR 75 | Temp 98.1°F | Resp 18 | Ht 68.0 in | Wt 227.8 lb

## 2020-01-13 DIAGNOSIS — L700 Acne vulgaris: Secondary | ICD-10-CM

## 2020-01-13 DIAGNOSIS — F5104 Psychophysiologic insomnia: Secondary | ICD-10-CM

## 2020-01-13 DIAGNOSIS — J309 Allergic rhinitis, unspecified: Secondary | ICD-10-CM

## 2020-01-13 DIAGNOSIS — I10 Essential (primary) hypertension: Secondary | ICD-10-CM | POA: Diagnosis not present

## 2020-01-13 DIAGNOSIS — E785 Hyperlipidemia, unspecified: Secondary | ICD-10-CM

## 2020-01-13 DIAGNOSIS — E669 Obesity, unspecified: Secondary | ICD-10-CM

## 2020-01-13 MED ORDER — PHENTERMINE HCL 37.5 MG PO TABS
ORAL_TABLET | ORAL | 1 refills | Status: DC
Start: 2020-01-13 — End: 2020-05-08

## 2020-01-13 MED ORDER — OLMESARTAN MEDOXOMIL-HCTZ 20-12.5 MG PO TABS: 1.0000 | ORAL_TABLET | Freq: Every day | ORAL | 3 refills | Status: DC

## 2020-01-13 MED ORDER — ACYCLOVIR 400 MG PO TABS: 400.0000 mg | ORAL_TABLET | ORAL | 1 refills | Status: DC | PRN

## 2020-01-13 MED FILL — ACYCLOVIR 400 MG TABLET: 400 | 90 days supply | Qty: 90 | Fill #0

## 2020-01-13 MED FILL — PHENTERMINE 37.5 MG TABLET: 37.5 | 60 days supply | Qty: 30 | Fill #0

## 2020-01-13 NOTE — Patient Instructions (Signed)
F/U in 4 months, call if you need me before  Weight loss goal of 10 pounds in 4 months  Start half phentermine once daily  It is important that you exercise regularly at least 30 minutes 5 times a week. If you develop chest pain, have severe difficulty breathing, or feel very tired, stop exercising immediately and seek medical attention   Thanks for choosing [facility], we consider it a privelige to serve you.

## 2020-01-15 ENCOUNTER — Encounter: Payer: Self-pay | Admitting: Family Medicine

## 2020-01-15 ENCOUNTER — Other Ambulatory Visit: Payer: Self-pay | Admitting: Family Medicine

## 2020-01-15 DIAGNOSIS — J309 Allergic rhinitis, unspecified: Secondary | ICD-10-CM | POA: Insufficient documentation

## 2020-01-15 MED ORDER — XHANCE 93 MCG/ACT NA EXHU: 1.0000 | INHALANT_SUSPENSION | Freq: Two times a day (BID) | NASAL | 2 refills | Status: DC

## 2020-01-15 NOTE — Assessment & Plan Note (Signed)
  Patient re-educated about  the importance of commitment to a  minimum of 150 minutes of exercise per week as able.  The importance of healthy food choices with portion control discussed, as well as eating regularly and within a 12 hour window most days. The need to choose "clean , green" food 50 to 75% of the time is discussed, as well as to make water the primary drink and set a goal of 64 ounces water daily.    Weight /BMI 01/13/2020 12/18/2019 11/13/2019  WEIGHT 227 lb 12.8 oz - 226 lb 9.6 oz  HEIGHT 5\' 8"  5\' 8"  5\' 8"   BMI 34.64 kg/m2 34.45 kg/m2 34.45 kg/m2    Start phentermine

## 2020-01-15 NOTE — Assessment & Plan Note (Signed)
Controlled, no change in medication DASH diet and commitment to daily physical activity for a minimum of 30 minutes discussed and encouraged, as a part of hypertension management. The importance of attaining a healthy weight is also discussed.  BP/Weight 01/13/2020 12/18/2019 11/13/2019 10/28/2019 06/05/2019 01/10/2019 9/40/9050  Systolic BP 256 154 884 573 - 344 830  Diastolic BP 84 59 80 68 - 74 69  Wt. (Lbs) 227.8 - 226.6 229 217 216.08 -  BMI 34.64 34.45 34.45 34.82 32.99 32.85 -

## 2020-01-15 NOTE — Assessment & Plan Note (Signed)
Treated with daily antibiotic by dermatology

## 2020-01-15 NOTE — Assessment & Plan Note (Signed)
Hyperlipidemia:Low fat diet discussed and encouraged.   Lipid Panel  Lab Results  Component Value Date   CHOL 199 09/09/2019   HDL 62 09/09/2019   LDLCALC 118 (H) 09/09/2019   TRIG 88 09/09/2019   CHOLHDL 3.2 09/09/2019   Needs to reduce fried and fatty foods

## 2020-01-15 NOTE — Assessment & Plan Note (Signed)
Steroid nasal spray specifically requested and prescribed

## 2020-01-15 NOTE — Assessment & Plan Note (Signed)
Sleep hygiene reviewed and written information offered also. Prescription sent for  medication needed.  

## 2020-01-15 NOTE — Progress Notes (Signed)
Kristine Lawson     MRN: [id number]      DOB: 12/26/1970   HPI Kristine Lawson is here for follow up and re-evaluation of chronic medical conditions, medication management and review of any available recent lab and radiology data.  Preventive health is updated, specifically  Cancer screening and Immunization.   Current on all cancer screening and immunization She is concerned about weight gain and would like to start medication to help to curb her appetite.  States she has started exercising more regularly plays tennis 3 days a week on average.  She does state that she is aware that she is overeating and over snacking and if she gets help with appetite suppression this would make a difference.  After the visit she also sent a message requesting specific allergy nasal spray which she says is very useful for her with control of allergies.  All medications were reviewed at the visit and updated.   ROS Denies recent fever or chills. Denies uncontrolled sinus pressure, nasal congestion, ear pain or sore throat. Denies chest congestion, productive cough or wheezing. Denies chest pains, palpitations and leg swelling Denies abdominal pain, nausea, vomiting,diarrhea or constipation.   Dec/o chronic Denies joint pain, swelling and limitation in mobility. Denies headaches, seizures, numbness, or tingling. Denies depression, uncontrolled anxiety or insomnia. Denies skin break down or rash.   PE  BP 125/84 (BP Location: Left Arm, Patient Position: Sitting, Cuff Size: Normal)   Pulse 75   Temp 98.1 F (36.7 C) (Oral)   Resp 18   Ht 5\' 8"  (1.727 m)   Wt 227 lb 12.8 oz (103.3 kg)   SpO2 98%   BMI 34.64 kg/m   Patient alert and oriented and in no cardiopulmonary distress.  HEENT: No facial asymmetry, EOMI,     Neck supple .  Chest: Clear to auscultation bilaterally.  CVS: S1, S2 no murmurs, no S3.Regular rate.  ABD: Soft non tender.   Ext: No edema  MS: Adequate ROM spine, shoulders,  hips and reduced in left  knee  Skin: Intact, facial acne covering cheek and chin areas  Psych: Good eye contact, normal affect. Memory intact not anxious or depressed appearing.  CNS: CN 2-12 intact, power,  normal throughout.no focal deficits noted.   Assessment & Plan  HYPERTENSION, BENIGN ESSENTIAL Controlled, no change in medication DASH diet and commitment to daily physical activity for a minimum of 30 minutes discussed and encouraged, as a part of hypertension management. The importance of attaining a healthy weight is also discussed.  BP/Weight 01/13/2020 12/18/2019 11/13/2019 10/28/2019 06/05/2019 01/10/2019 10/31/8117  Systolic BP 147 829 562 130 - 865 784  Diastolic BP 84 59 80 68 - 74 69  Wt. (Lbs) 227.8 - 226.6 229 217 216.08 -  BMI 34.64 34.45 34.45 34.82 32.99 32.85 -       Acne vulgaris Treated with daily antibiotic by dermatology  Obesity (BMI 30.0-34.9)  Patient re-educated about  the importance of commitment to a  minimum of 150 minutes of exercise per week as able.  The importance of healthy food choices with portion control discussed, as well as eating regularly and within a 12 hour window most days. The need to choose "clean , green" food 50 to 75% of the time is discussed, as well as to make water the primary drink and set a goal of 64 ounces water daily.    Weight /BMI 01/13/2020 12/18/2019 11/13/2019  WEIGHT 227 lb 12.8 oz - 226 lb 9.6  oz  HEIGHT 5\' 8"  5\' 8"  5\' 8"   BMI 34.64 kg/m2 34.45 kg/m2 34.45 kg/m2    Start phentermine  Dyslipidemia Hyperlipidemia:Low fat diet discussed and encouraged.   Lipid Panel  Lab Results  Component Value Date   CHOL 199 09/09/2019   HDL 62 09/09/2019   LDLCALC 118 (H) 09/09/2019   TRIG 88 09/09/2019   CHOLHDL 3.2 09/09/2019   Needs to reduce fried and fatty foods   Insomnia Sleep hygiene reviewed and written information offered also. Prescription sent for  medication needed.   Allergic rhinitis Steroid  nasal spray specifically requested and prescribed

## 2020-01-18 MED FILL — OLMESARTAN-HCTZ 20-12.5 MG: 20-12.5 | 90 days supply | Qty: 90 | Fill #0

## 2020-01-21 MED FILL — XHANCE 93 MCG/ACT EXHU: 93 | 30 days supply | Qty: 16 | Fill #0

## 2020-01-21 MED FILL — ADAPALENE-BENZOYL PEROXIDE: 0.1-2.5 | 30 days supply | Qty: 45 | Fill #0

## 2020-02-07 MED FILL — SULFAMETHOXAZOLE-TMP DS TAB: 800-160 | 30 days supply | Qty: 60 | Fill #1

## 2020-02-25 DIAGNOSIS — L700 Acne vulgaris: Secondary | ICD-10-CM | POA: Diagnosis not present

## 2020-02-25 MED FILL — SPIRONOLACTONE 25 MG TABS: 25 | 30 days supply | Qty: 30 | Fill #0

## 2020-03-01 ENCOUNTER — Encounter: Payer: Self-pay | Admitting: Family Medicine

## 2020-03-02 ENCOUNTER — Other Ambulatory Visit: Payer: Self-pay | Admitting: Family Medicine

## 2020-03-02 MED ORDER — FLUCONAZOLE 150 MG PO TABS
ORAL_TABLET | ORAL | 2 refills | Status: DC
Start: 2020-03-10 — End: 2020-05-08

## 2020-03-02 MED ORDER — FLUCONAZOLE 100 MG PO TABS
100.0000 mg | ORAL_TABLET | Freq: Every day | ORAL | 0 refills | Status: AC
Start: 2020-03-02 — End: 2020-03-09

## 2020-03-02 MED FILL — FLUCONAZOLE 150 MG TABS: 150 | 28 days supply | Qty: 4 | Fill #0

## 2020-03-02 MED FILL — hydrOXYzine HCL 50 MG TABS: 50 | 90 days supply | Qty: 270 | Fill #1

## 2020-03-02 MED FILL — FLUCONAZOLE 100 MG TAB: 100 | 7 days supply | Qty: 7 | Fill #0

## 2020-03-02 MED FILL — ADAPALENE-BENZOYL PEROXIDE: 0.1-2.5 | 30 days supply | Qty: 45 | Fill #1

## 2020-03-04 ENCOUNTER — Other Ambulatory Visit: Payer: Self-pay | Admitting: Family Medicine

## 2020-03-04 MED FILL — traZODone HCL 50 MG TABS: 50 | 90 days supply | Qty: 90 | Fill #0

## 2020-03-23 MED FILL — SPIRONOLACTONE 25 MG TABS: 25 | 30 days supply | Qty: 30 | Fill #1

## 2020-03-27 ENCOUNTER — Encounter (INDEPENDENT_AMBULATORY_CARE_PROVIDER_SITE_OTHER): Payer: Self-pay

## 2020-03-31 ENCOUNTER — Ambulatory Visit (INDEPENDENT_AMBULATORY_CARE_PROVIDER_SITE_OTHER): Payer: 59 | Admitting: Internal Medicine

## 2020-04-02 MED FILL — PHENTERMINE 37.5 MG TABLET: 37.5 | 60 days supply | Qty: 30 | Fill #1

## 2020-04-16 MED FILL — SPIRONOLACTONE 25 MG TABS: 25 | 30 days supply | Qty: 30 | Fill #2

## 2020-04-16 MED FILL — OLMESARTAN-HCTZ 20-12.5 MG: 20-12.5 | 90 days supply | Qty: 90 | Fill #1

## 2020-04-29 ENCOUNTER — Other Ambulatory Visit (HOSPITAL_COMMUNITY): Payer: Self-pay | Admitting: Dermatology

## 2020-04-29 DIAGNOSIS — Z79899 Other long term (current) drug therapy: Secondary | ICD-10-CM | POA: Diagnosis not present

## 2020-04-29 DIAGNOSIS — L658 Other specified nonscarring hair loss: Secondary | ICD-10-CM | POA: Diagnosis not present

## 2020-04-29 DIAGNOSIS — L700 Acne vulgaris: Secondary | ICD-10-CM | POA: Diagnosis not present

## 2020-05-02 MED FILL — CLOBETASOL 0.05% SOLUTION: 0.05 | 30 days supply | Qty: 50 | Fill #0

## 2020-05-04 MED FILL — MYORISAN 40 MG CAPSULE: 40 | 30 days supply | Qty: 30 | Fill #0

## 2020-05-05 ENCOUNTER — Emergency Department (HOSPITAL_COMMUNITY): Payer: 59

## 2020-05-05 ENCOUNTER — Encounter (HOSPITAL_COMMUNITY): Payer: Self-pay | Admitting: *Deleted

## 2020-05-05 ENCOUNTER — Other Ambulatory Visit (HOSPITAL_COMMUNITY): Payer: Self-pay | Admitting: Physician Assistant

## 2020-05-05 ENCOUNTER — Ambulatory Visit: Payer: 59

## 2020-05-05 ENCOUNTER — Other Ambulatory Visit: Payer: Self-pay

## 2020-05-05 ENCOUNTER — Emergency Department (HOSPITAL_COMMUNITY)
Admission: EM | Admit: 2020-05-05 | Discharge: 2020-05-05 | Disposition: A | Discharge status: Home / Self Care | Payer: 59 | Attending: Emergency Medicine | Admitting: Emergency Medicine

## 2020-05-05 ENCOUNTER — Ambulatory Visit
Admission: EM | Admit: 2020-05-05 | Discharge: 2020-05-05 | Disposition: A | Discharge status: Home / Self Care | Payer: 59

## 2020-05-05 DIAGNOSIS — Z87891 Personal history of nicotine dependence: Secondary | ICD-10-CM | POA: Insufficient documentation

## 2020-05-05 DIAGNOSIS — N200 Calculus of kidney: Secondary | ICD-10-CM | POA: Diagnosis not present

## 2020-05-05 DIAGNOSIS — I10 Essential (primary) hypertension: Secondary | ICD-10-CM | POA: Insufficient documentation

## 2020-05-05 DIAGNOSIS — R112 Nausea with vomiting, unspecified: Secondary | ICD-10-CM

## 2020-05-05 DIAGNOSIS — R1011 Right upper quadrant pain: Secondary | ICD-10-CM

## 2020-05-05 DIAGNOSIS — R109 Unspecified abdominal pain: Secondary | ICD-10-CM

## 2020-05-05 LAB — URINALYSIS, ROUTINE W REFLEX MICROSCOPIC
Bilirubin Urine: NEGATIVE
Glucose, UA: NEGATIVE mg/dL
Hgb urine dipstick: NEGATIVE
Ketones, ur: 5 mg/dL — AB
Leukocytes,Ua: NEGATIVE
Nitrite: NEGATIVE
Protein, ur: NEGATIVE mg/dL
Specific Gravity, Urine: 1.028 (ref 1.005–1.030)
pH: 5 (ref 5.0–8.0)

## 2020-05-05 LAB — LIPASE, BLOOD: Lipase: 65 U/L — ABNORMAL HIGH (ref 11–51)

## 2020-05-05 LAB — CBC
HCT: 41.3 % (ref 36.0–46.0)
Hemoglobin: 13.5 g/dL (ref 12.0–15.0)
MCH: 29 pg (ref 26.0–34.0)
MCHC: 32.7 g/dL (ref 30.0–36.0)
MCV: 88.8 fL (ref 80.0–100.0)
Platelets: 383 10*3/uL (ref 150–400)
RBC: 4.65 MIL/uL (ref 3.87–5.11)
RDW: 13 % (ref 11.5–15.5)
WBC: 7 10*3/uL (ref 4.0–10.5)
nRBC: 0 % (ref 0.0–0.2)

## 2020-05-05 LAB — COMPREHENSIVE METABOLIC PANEL
ALT: 22 U/L (ref 0–44)
AST: 24 U/L (ref 15–41)
Albumin: 4.2 g/dL (ref 3.5–5.0)
Alkaline Phosphatase: 43 U/L (ref 38–126)
Anion gap: 9 (ref 5–15)
BUN: 13 mg/dL (ref 6–20)
CO2: 26 mmol/L (ref 22–32)
Calcium: 9.3 mg/dL (ref 8.9–10.3)
Chloride: 102 mmol/L (ref 98–111)
Creatinine, Ser: 1.17 mg/dL — ABNORMAL HIGH (ref 0.44–1.00)
GFR, Estimated: 55 mL/min — ABNORMAL LOW (ref >60)
Glucose, Bld: 105 mg/dL — ABNORMAL HIGH (ref 70–99)
Potassium: 4.5 mmol/L (ref 3.5–5.1)
Sodium: 137 mmol/L (ref 135–145)
Total Bilirubin: 0.8 mg/dL (ref 0.3–1.2)
Total Protein: 7.7 g/dL (ref 6.5–8.1)

## 2020-05-05 MED ORDER — IOHEXOL 300 MG/ML  SOLN
100.0000 mL | Freq: Once | INTRAMUSCULAR | Status: AC | PRN
  Administered 2020-05-05: 100 mL via INTRAVENOUS

## 2020-05-05 MED ORDER — MORPHINE SULFATE (PF) 2 MG/ML IV SOLN
2.0000 mg | Freq: Once | INTRAVENOUS | Status: AC
  Administered 2020-05-05: 2 mg via INTRAVENOUS
  Filled 2020-05-05: qty 1

## 2020-05-05 MED ORDER — SODIUM CHLORIDE 0.9 % IV BOLUS
1000.0000 mL | Freq: Once | INTRAVENOUS | Status: AC
  Administered 2020-05-05: 1000 mL via INTRAVENOUS

## 2020-05-05 MED ORDER — ONDANSETRON 4 MG PO TBDP: 4.0000 mg | ORAL_TABLET | Freq: Three times a day (TID) | ORAL | 0 refills | Status: DC | PRN

## 2020-05-05 MED ORDER — OXYCODONE-ACETAMINOPHEN 5-325 MG PO TABS
2.0000 | ORAL_TABLET | Freq: Once | ORAL | Status: AC
  Administered 2020-05-05: 2 via ORAL
  Filled 2020-05-05: qty 2

## 2020-05-05 MED ORDER — TAMSULOSIN HCL 0.4 MG PO CAPS: 0.4000 mg | ORAL_CAPSULE | Freq: Every day | ORAL | 0 refills | Status: DC

## 2020-05-05 MED ORDER — ONDANSETRON HCL 4 MG/2ML IJ SOLN
4.0000 mg | Freq: Once | INTRAMUSCULAR | Status: AC
  Administered 2020-05-05: 4 mg via INTRAVENOUS
  Filled 2020-05-05: qty 2

## 2020-05-05 MED ORDER — OXYCODONE-ACETAMINOPHEN 5-325 MG PO TABS: 1.0000 | ORAL_TABLET | Freq: Four times a day (QID) | ORAL | 0 refills | Status: DC | PRN

## 2020-05-05 NOTE — Discharge Instructions (Signed)
Recommending further evaluation and management in the ED for RUQ pain.  Cannot treat obstruction in urgent care setting.  Patient aware and in agreement.  Declines EMS transport, husband will take her by private vehicle to ED.

## 2020-05-05 NOTE — Discharge Instructions (Addendum)
-  Prescription sent to your pharmacy to [facility] in [place] for [place].  Percocet for severe pain only and Zofran is for nausea.  Do not take the Percocet if you are going to be working or driving as it can make you drowsy.  You can take Tylenol for pain otherwise.  Just do not take them at the same time as Percocet has Tylenol in it.  -Prescription for Flomax sent to the [facility].  As we discussed this can make your blood pressure drop so you should get up and change positions slowly while taking this medicine.  Follow-up with your primary care doctor to have your creatinine rechecked.  Today it was elevated at 1.17. You should increase your fluid intake.  Follow-up with urology if needed.

## 2020-05-05 NOTE — ED Notes (Signed)
Pt tolerating oral fluids well 

## 2020-05-05 NOTE — ED Triage Notes (Signed)
Pt presents with right quadrant pain that developed yesterday has had small BM this am, concerned with possible obstruction

## 2020-05-05 NOTE — ED Triage Notes (Signed)
Pt abd pain since yesterday morning.  + dry heaving LBM this morning and normal per pt but small amount.

## 2020-05-05 NOTE — ED Triage Notes (Signed)
Patient is being discharged from the Urgent Care and sent to the Emergency Department via private vehicle  . Per B Wurst , patient is in need of higher level of care to rule out bowel obstruction . Patient is aware and verbalizes understanding of plan of care.  Vitals:   05/05/20 1316  BP: (!) 149/83  Pulse: 72  Resp: 16  Temp: 99.3 F (37.4 C)  SpO2: 95%

## 2020-05-05 NOTE — ED Provider Notes (Signed)
[facility] EMERGENCY DEPARTMENT Provider Note   CSN: [id number] Arrival date & time: 05/05/20  1353     History Chief Complaint  Patient presents with   Abdominal Pain    Kristine Lawson is a 49 y.o. 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  Abdominal surgical history includes cholecystectomy and appendectomy. Had covid vaccinations.  HPI Patient presents to emergency department today with chief complaint abdominal pain x2 days. Pain started when she woke up yesterday. Pain is located in right upper quadrant and has been intermittent. She has had decreased PO intake secondary to pain. She was only able to eat soup last night, only drank water today. Endorses nausea when pain is severe as well as chills. She noticed today the pain radiates to her back. She is describing the pain as pulling and stabbing. She has been taking tylenol with minimal symptom improvement. Pain is currently 7/10 in severity. She had a BM yesterday that was normal for her. She had one this morning that was smaller amount, no diarrhea or blood in stool. Denies any alcohol consumption. No suspicious food intake, no known sick contacts. Also denies fever, cough, chest pain, shortness of breath, gross hematuria, urinary frequency, dysuria, flank pain, pelvic pain, melena.      Past Medical History:  Diagnosis Date   Allergy    Anemia    IDA   Arthritis    Family history of breast cancer    Family history of cancer of gallbladder    Family history of cervical cancer    Family history of lung cancer    Family history of prostate cancer    Family history of throat cancer    Hypertension    Insomnia    meds needed daily to achieve 6+ hours sleep.    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/01/2018   Abnormal urine odor 09/01/2018   Muscle spasm 02/04/2018   Goiter 10/07/2015   S/P gastric bypass 02/10/2015   Insomnia 04/05/2014   Dyslipidemia 00/93/8182   Metabolic syndrome X 99/37/1696   Vitamin D deficiency 04/05/2014   Psychosocial stressors 12/21/2012   Obesity (BMI 30.0-34.9) 03/05/2011   Acute pain of left knee 05/01/2008   PES PLANUS 05/01/2008   Acne vulgaris 09/28/2007   HYPERTENSION, BENIGN ESSENTIAL 10/24/2006    Past Surgical History:  Procedure Laterality Date   ABLATION     uterine ablation   APPENDECTOMY     BIOPSY  12/18/2019   Procedure: BIOPSY;  Surgeon: Rogene Houston, MD;  Location: [facility];  Service: Endoscopy;;  gastric   BREATH TEK H PYLORI N/A 11/18/2014   Procedure: BREATH TEK Kandis Ban;  Surgeon: Greer Pickerel, MD;  Location: [facility];  Service: General;  Laterality: N/A;   CHOLECYSTECTOMY     COLONOSCOPY N/A 12/18/2019   Procedure: COLONOSCOPY;  Surgeon: Rogene Houston, MD;  Location: [facility];  Service: Endoscopy;  Laterality: N/A;  930   ESOPHAGOGASTRODUODENOSCOPY N/A 10/01/2018   Procedure: ESOPHAGOGASTRODUODENOSCOPY (EGD);  Surgeon: Rogene Houston, MD;  Location: [facility];  Service: Endoscopy;  Laterality: N/A;   ESOPHAGOGASTRODUODENOSCOPY N/A 12/18/2019   Procedure: ESOPHAGOGASTRODUODENOSCOPY (EGD);  Surgeon: Rogene Houston, MD;  Location: [facility];  Service: Endoscopy;  Laterality: N/A;   LAPAROSCOPIC ROUX-EN-Y GASTRIC BYPASS WITH HIATAL HERNIA REPAIR N/A 02/10/2015   Procedure: LAPAROSCOPIC ROUX-EN-Y GASTRIC BYPASS ;  Surgeon: Greer Pickerel, MD;   Location: [facility];  Service: General;  Laterality: N/A;   TUBAL LIGATION     UPPER GI ENDOSCOPY N/A 02/10/2015   Procedure: UPPER GI ENDOSCOPY;  Surgeon: Greer Pickerel, MD;  Location: [facility];  Service: General;  Laterality: N/A;     OB History   No obstetric history on file.     Family History  Problem Relation Age of Onset   Hypertension Mother    Diabetes Mother    Kidney disease Mother    Hyperlipidemia Mother    Colon cancer Mother 41       highly suspious of colon mass   Breast cancer Mother 26   Kidney disease Father    Stroke Father    Heart disease Father    Hyperlipidemia Father    Heart attack Father    Diverticulitis Father    Hypertension Brother    Diabetes Brother    Healthy Sister    Other Daughter        hypertension   Kidney disease Daughter    Blindness Daughter    Healthy Daughter    Healthy Son    Cancer Maternal Aunt 66       gallbladder cancer   Breast cancer Paternal Aunt        dx. in her 46s   Cervical cancer Maternal Grandmother        dx. in her 69s   Prostate cancer Maternal Grandfather        metastatic   Heart attack Paternal Grandmother    Hypertension Paternal Grandmother    Stroke Paternal Grandmother    Lung cancer Paternal Grandfather    Throat cancer Paternal Grandfather    Breast cancer Paternal Aunt        dx. in her 17s   Breast cancer Paternal Aunt        dx. in her 28s   Throat cancer Paternal Uncle    Lung cancer Paternal Uncle    Breast cancer Other        dx. 13s or 85s (mother's first cousins x3)   SIDS Maternal Aunt    Prostate cancer Maternal Uncle        dx. in his 22s, not metastatic   Prostate cancer Maternal Uncle        dx. in his 32s, not metastatic   Breast cancer Cousin        dx. late 36s (paternal cousin)   Breast cancer Other        dx. 40s/early 25s (maternal second cousins x2)    Social History   Tobacco Use   Smoking status: Former Smoker     Packs/day: 0.25    Years: 3.00    Pack years: 0.75    Quit date: 02/05/2007    Years since quitting: 13.2   Smokeless tobacco: Never Used  Vaping Use   Vaping Use: Never used  Substance Use Topics   Alcohol use: Yes    Comment: occassional wine 2-3 times a year  Drug use: No    Home Medications Prior to Admission medications   Medication Sig Start Date End Date Taking? Authorizing Provider  acyclovir (ZOVIRAX) 400 MG tablet Take 1 tablet (400 mg total) by mouth as needed. 01/13/20   Fayrene Helper, MD  Calcium Citrate (CITRACAL PO) Take 500 mg by mouth 3 (three) times daily.    [provider]  cyclobenzaprine (FLEXERIL) 10 MG tablet Take 1 tablet (10 mg total) by mouth at bedtime. Patient taking differently: Take 10 mg by mouth at bedtime as needed for muscle spasms.  11/13/19   Corum, Rex Kras, MD  diphenhydrAMINE (BENADRYL) 50 MG tablet Take 50 mg by mouth at bedtime as needed for itching.    [provider]  ergocalciferol (VITAMIN D2) 1.25 MG (50000 UT) capsule Take 1 capsule (50,000 Units total) by mouth once a week. One capsule once weekly 09/10/19   Fayrene Helper, MD  fluconazole (DIFLUCAN) 150 MG tablet Take one tablet by mouth once weekly , as needed, for vaginal itch associated with yeats infection 03/10/20   Fayrene Helper, MD  hydrOXYzine (ATARAX/VISTARIL) 50 MG tablet TAKE 3 TABLETS BY MOUTH AT BEDTIME Patient taking differently: Take 75 mg by mouth at bedtime.  10/25/19   Fayrene Helper, MD  Multiple Vitamin (MULTIVITAMIN) tablet Take 1 tablet by mouth daily.     [provider]  olmesartan-hydrochlorothiazide (BENICAR HCT) 20-12.5 MG tablet Take 1 tablet by mouth daily. 01/13/20   Fayrene Helper, MD  ondansetron (ZOFRAN ODT) 4 MG disintegrating tablet Take 1 tablet (4 mg total) by mouth every 8 (eight) hours as needed for nausea or vomiting. 05/05/20   Mylah Baynes E, PA-C  oxyCODONE-acetaminophen (PERCOCET/ROXICET)  5-325 MG tablet Take 1 tablet by mouth every 6 (six) hours as needed for severe pain. 05/05/20   Migdalia Olejniczak E, PA-C  pantoprazole (PROTONIX) 40 MG tablet Take 1 tablet (40 mg total) by mouth daily before breakfast. 01/06/20   Rogene Houston, MD  phentermine (ADIPEX-P) 37.5 MG tablet Take one half tablet by mouth every morning at breakfast 01/13/20   Fayrene Helper, MD  polycarbophil (FIBERCON) 625 MG tablet Take 1,250 mg by mouth daily.    [provider]  Probiotic Product (ALIGN) CHEW Chew by mouth daily.    [provider]  sulfamethoxazole-trimethoprim (BACTRIM DS) 800-160 MG tablet Take 1 tablet by mouth 2 (two) times daily.    [provider]  tamsulosin (FLOMAX) 0.4 MG CAPS capsule Take 1 capsule (0.4 mg total) by mouth daily after supper for 7 days. 05/05/20 05/12/20  Casee Knepp E, PA-C  traZODone (DESYREL) 50 MG tablet TAKE 1 TABLET BY MOUTH AT BEDTIME 03/04/20   Fayrene Helper, MD  Truett Perna 93 MCG/ACT EXHU Place 1 puff into the nose in the morning and at bedtime. 01/15/20 02/14/20  Fayrene Helper, MD    Allergies    Lisinopril, Aspirin, Triamterene, and Other  Review of Systems   Review of Systems All other systems are reviewed and are negative for acute change except as noted in the HPI.  Physical Exam Updated Vital Signs BP (!) 142/83 (BP Location: Right Arm)    Pulse 64    Temp 98.9 F (37.2 C) (Oral)    Resp 14    Ht 5\' 8"  (1.727 m)    Wt 104.3 kg    SpO2 100%    BMI 34.97 kg/m   Physical Exam Vitals and nursing note reviewed.  Constitutional:  General: She is not in acute distress.    Appearance: She is not ill-appearing.  HENT:     Head: Normocephalic and atraumatic.     Right Ear: Tympanic membrane and external ear normal.     Left Ear: Tympanic membrane and external ear normal.     Nose: Nose normal.     Mouth/Throat:     Mouth: Mucous membranes are moist.     Pharynx: Oropharynx is clear.  Eyes:      General: No scleral icterus.       Right eye: No discharge.        Left eye: No discharge.     Extraocular Movements: Extraocular movements intact.     Conjunctiva/sclera: Conjunctivae normal.     Pupils: Pupils are equal, round, and reactive to light.  Neck:     Vascular: No JVD.  Cardiovascular:     Rate and Rhythm: Normal rate and regular rhythm.     Pulses: Normal pulses.          Radial pulses are 2+ on the right side and 2+ on the left side.     Heart sounds: Normal heart sounds.  Pulmonary:     Comments: Lungs clear to auscultation in all fields. Symmetric chest rise. No wheezing, rales, or rhonchi. Abdominal:     General: Bowel sounds are normal.     Tenderness: There is abdominal tenderness in the right upper quadrant and epigastric area. There is right CVA tenderness. There is no left CVA tenderness.     Comments: Abdomen is soft, non-distended, and non-tender in all quadrants. No rigidity, no guarding. No peritoneal signs.  Musculoskeletal:        General: Normal range of motion.     Cervical back: Normal range of motion.  Skin:    General: Skin is warm and dry.     Capillary Refill: Capillary refill takes less than 2 seconds.  Neurological:     Mental Status: She is oriented to person, place, and time.     GCS: GCS eye subscore is 4. GCS verbal subscore is 5. GCS motor subscore is 6.     Comments: Fluent speech, no facial droop.  Psychiatric:        Behavior: Behavior normal.     ED Results / Procedures / Treatments   Labs (all labs ordered are listed, but only abnormal results are displayed) Labs Reviewed  LIPASE, BLOOD - Abnormal; Notable for the following components:      Result Value   Lipase 65 (*)    All other components within normal limits  COMPREHENSIVE METABOLIC PANEL - Abnormal; Notable for the following components:   Glucose, Bld 105 (*)    Creatinine, Ser 1.17 (*)    GFR, Estimated 55 (*)    All other components within normal limits  URINALYSIS,  ROUTINE W REFLEX MICROSCOPIC - Abnormal; Notable for the following components:   APPearance HAZY (*)    Ketones, ur 5 (*)    All other components within normal limits  CBC    EKG None  Radiology CT ABDOMEN PELVIS W CONTRAST  Result Date: 05/05/2020 CLINICAL DATA:  49 year old female with right lower quadrant abdominal pain. EXAM: CT ABDOMEN AND PELVIS WITH CONTRAST TECHNIQUE: Multidetector CT imaging of the abdomen and pelvis was performed using the standard protocol following bolus administration of intravenous contrast. CONTRAST:  160mL OMNIPAQUE IOHEXOL 300 MG/ML  SOLN COMPARISON:  None. FINDINGS: Lower chest: The visualized lung bases are clear. No intra-abdominal  free air or free fluid. Hepatobiliary: The liver is unremarkable. No intrahepatic biliary dilatation. Cholecystectomy. No retained calcified stone noted in the central CBD. Pancreas: Unremarkable. No pancreatic ductal dilatation or surrounding inflammatory changes. Spleen: Normal in size without focal abnormality. Adrenals/Urinary Tract: The adrenal glands unremarkable. There is mild right hydronephrosis. There is delayed enhancement of the right renal parenchyma with mild perinephric stranding. Correlation with urinalysis recommended to exclude superimposed UTI. There is a 6 mm stone in the distal right ureter. There is a punctate non-obstructing left renal inferior pole calculus. No hydronephrosis on the left. The left ureter and urinary bladder appear unremarkable. 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 There is colonic diverticulosis without active inflammatory changes. There is no bowel obstruction or active inflammation. Appendectomy. Vascular/Lymphatic: The abdominal aorta and IVC unremarkable. No portal venous gas. There is no adenopathy. Reproductive: Multiple uterine fibroids. Other: None Musculoskeletal: No acute or significant osseous findings. IMPRESSION: 1. A 6 mm distal right ureteral stone with  mild right hydronephrosis. Correlation with urinalysis recommended to exclude superimposed UTI. 2. Colonic diverticulosis. No bowel obstruction. 3. Uterine fibroids. Electronically Signed   By: Anner Crete M.D.   On: 05/05/2020 19:14    Procedures Procedures (including critical care time)  Medications Ordered in ED Medications  sodium chloride 0.9 % bolus 1,000 mL (0 mLs Intravenous Stopped 05/05/20 1941)  morphine 2 MG/ML injection 2 mg (2 mg Intravenous Given 05/05/20 1810)  ondansetron (ZOFRAN) injection 4 mg (4 mg Intravenous Given 05/05/20 1810)  iohexol (OMNIPAQUE) 300 MG/ML solution 100 mL (100 mLs Intravenous Contrast Given 05/05/20 1853)  oxyCODONE-acetaminophen (PERCOCET/ROXICET) 5-325 MG per tablet 2 tablet (2 tablets Oral Given 05/05/20 1944)    ED Course  I have reviewed the triage vital signs and the nursing notes.  Pertinent labs & imaging results that were available during my care of the patient were reviewed by me and considered in my medical decision making (see chart for details).    MDM Rules/Calculators/A&P                          History provided by patient with additional history obtained from chart review.    Patient presents to the ED with complaints of abdominal pain. Patient nontoxic appearing, in no apparent distress, vitals WNL. On exam patient tender to RUQ and epigastric, no peritoneal signs. R CVA tenderness. Will evaluate with labs and CT A.P. Analgesics, anti-emetics, and fluids administered.   Labs reviewed and grossly unremarkable. No leukocytosis, no anemia, no significant electrolyte derangements. LFTs WNL. Creatinine slightly elevated 1.17, with baseline around 0.8.  Lipase elevated at 65, no history of prior. Urinalysis without obvious infection, no blood. Imaging shows mild right hydronephrosis and 41mm stone in right distal ureter. Patient given PO percocet. On reassessment pain is much improved and she is eager to be discharged home.  Patient tolerating PO in the emergency department. Pt will be dc home with pain medications & has been advised to follow up with PCP or urology. She has prescription for tramadol PRN. This will be discontinued and will treat with short course of percocet for pain related to kidney stone. Will discharge home with supportive measures. I discussed results, treatment plan, need for PCP follow-up, and return precautions with the patient. Provided opportunity for questions, patient confirmed understanding and is in agreement with plan. Patient aware she will need to follow up with pcp to have creatinine rechecked.  Portions of this note were  generated with [employer]. Dictation errors may occur despite best attempts at proofreading.   Final Clinical Impression(s) / ED Diagnoses Final diagnoses:  Abdominal pain, unspecified abdominal location  Kidney stone    Rx / DC Orders ED Discharge Orders         Ordered    oxyCODONE-acetaminophen (PERCOCET/ROXICET) 5-325 MG tablet  Every 6 hours PRN        05/05/20 2029    ondansetron (ZOFRAN ODT) 4 MG disintegrating tablet  Every 8 hours PRN        05/05/20 2029    tamsulosin (FLOMAX) 0.4 MG CAPS capsule  Daily after supper        05/05/20 2030           Kristine Lawson 05/05/20 2106    Daleen Bo, MD 05/06/20 0007

## 2020-05-05 NOTE — ED Provider Notes (Signed)
[place]   [id number] 05/05/20 Arrival Time: 6962  CC: ABDOMINAL DISCOMFORT  SUBJECTIVE:  Kristine Lawson is a 49 y.o. female who presents with complaint of RUQ pain x 1 day.  Denies a precipitating event, or trauma.  Hx significant for multiple abdominal surgeries.  Localizes pain to RUQ.  Describes as worsening, intermittent and stabbing in character.  Has not tried OTC medications without relief.  Symptoms made worse with eating and drinking, was able to keep some water down this morning.  Denies similar symptoms in the past.  Last BM this morning and smaller than normal.  Complains of nausea, vomiting  Denies fever, chills, chest pain, SOB, diarrhea, constipation, hematochezia, melena, dysuria, difficulty urinating, increased frequency or urgency, flank pain, loss of bowel or bladder function.  No LMP recorded. Patient has had an ablation.  ROS: As per HPI.  All other pertinent ROS negative.     Past Medical History:  Diagnosis Date  . Allergy   . Anemia    IDA  . Arthritis   . Family history of breast cancer   . Family history of cancer of gallbladder   . Family history of cervical cancer   . Family history of lung cancer   . Family history of prostate cancer   . Family history of throat cancer   . Hypertension   . Insomnia    meds needed daily to achieve 6+ hours sleep.   Past Surgical History:  Procedure Laterality Date  . ABLATION     uterine ablation  . APPENDECTOMY    . BIOPSY  12/18/2019   Procedure: BIOPSY;  Surgeon: Rogene Houston, MD;  Location: [facility];  Service: Endoscopy;;  gastric  . BREATH TEK H PYLORI N/A 11/18/2014   Procedure: BREATH TEK H PYLORI;  Surgeon: Greer Pickerel, MD;  Location: [facility];  Service: General;  Laterality: N/A;  . CHOLECYSTECTOMY    . COLONOSCOPY N/A 12/18/2019   Procedure: COLONOSCOPY;  Surgeon: Rogene Houston, MD;  Location: [facility];  Service: Endoscopy;  Laterality: N/A;  930  .  ESOPHAGOGASTRODUODENOSCOPY N/A 10/01/2018   Procedure: ESOPHAGOGASTRODUODENOSCOPY (EGD);  Surgeon: Rogene Houston, MD;  Location: [facility];  Service: Endoscopy;  Laterality: N/A;  . ESOPHAGOGASTRODUODENOSCOPY N/A 12/18/2019   Procedure: ESOPHAGOGASTRODUODENOSCOPY (EGD);  Surgeon: Rogene Houston, MD;  Location: [facility];  Service: Endoscopy;  Laterality: N/A;  . LAPAROSCOPIC ROUX-EN-Y GASTRIC BYPASS WITH HIATAL HERNIA REPAIR N/A 02/10/2015   Procedure: LAPAROSCOPIC ROUX-EN-Y GASTRIC BYPASS ;  Surgeon: Greer Pickerel, MD;  Location: [facility];  Service: General;  Laterality: N/A;  . TUBAL LIGATION    . UPPER GI ENDOSCOPY N/A 02/10/2015   Procedure: UPPER GI ENDOSCOPY;  Surgeon: Greer Pickerel, MD;  Location: [facility];  Service: General;  Laterality: N/A;   Allergies  Allergen Reactions  . Lisinopril Cough  . Aspirin Hives    Goody Powder-  . Triamterene Other (See Comments)    Cramps   . Other Hives   No current facility-administered medications on file prior to encounter.   Current Outpatient Medications on File Prior to Encounter  Medication Sig Dispense Refill  . acetaminophen (TYLENOL) 500 MG tablet Take 1,000 mg by mouth every 6 (six) hours as needed for moderate pain.    Marland Kitchen acyclovir (ZOVIRAX) 400 MG tablet Take 1 tablet (400 mg total) by mouth as needed. 90 tablet 1  . Calcium Citrate (CITRACAL PO) Take 500 mg by mouth 3 (three) times daily.    Marland Kitchen  cyclobenzaprine (FLEXERIL) 10 MG tablet Take 1 tablet (10 mg total) by mouth at bedtime. (Patient taking differently: Take 10 mg by mouth at bedtime as needed for muscle spasms. ) 90 tablet 1  . diphenhydrAMINE (BENADRYL) 50 MG tablet Take 50 mg by mouth at bedtime as needed for itching.    . ergocalciferol (VITAMIN D2) 1.25 MG (50000 UT) capsule Take 1 capsule (50,000 Units total) by mouth once a week. One capsule once weekly 12 capsule 1  . fluconazole (DIFLUCAN) 150 MG tablet Take one tablet by mouth once weekly , as needed, for vaginal  itch associated with yeats infection 4 tablet 2  . hydrOXYzine (ATARAX/VISTARIL) 50 MG tablet TAKE 3 TABLETS BY MOUTH AT BEDTIME (Patient taking differently: Take 75 mg by mouth at bedtime. ) 270 tablet 1  . Multiple Vitamin (MULTIVITAMIN) tablet Take 1 tablet by mouth daily.     Marland Kitchen olmesartan-hydrochlorothiazide (BENICAR HCT) 20-12.5 MG tablet Take 1 tablet by mouth daily. 90 tablet 3  . pantoprazole (PROTONIX) 40 MG tablet Take 1 tablet (40 mg total) by mouth daily before breakfast. 30 tablet 5  . phentermine (ADIPEX-P) 37.5 MG tablet Take one half tablet by mouth every morning at breakfast 30 tablet 1  . polycarbophil (FIBERCON) 625 MG tablet Take 1,250 mg by mouth daily.    . Probiotic Product (ALIGN) CHEW Chew by mouth daily.    Marland Kitchen sulfamethoxazole-trimethoprim (BACTRIM DS) 800-160 MG tablet Take 1 tablet by mouth 2 (two) times daily.    . traMADol (ULTRAM) 50 MG tablet TAKE 1 TABLET BY MOUTH ONCE DAILY FOR UNCONTROLLED KNEE PAIN IF NEEDED (Patient taking differently: Take 50 mg by mouth daily as needed for severe pain. TAKE 1 TABLET BY MOUTH ONCE DAILY FOR UNCONTROLLED KNEE PAIN IF NEEDED) 90 tablet 0  . traZODone (DESYREL) 50 MG tablet TAKE 1 TABLET BY MOUTH AT BEDTIME 90 tablet 1  . XHANCE 93 MCG/ACT EXHU Place 1 puff into the nose in the morning and at bedtime. 16 mL 2   Social History   Socioeconomic History  . Marital status: Married    Spouse name: Kristine Lawson  . Number of children: 4  . Years of education: Not on file  . Highest education level: Associate degree: occupational, Hotel manager, or vocational program  Occupational History  . Occupation: CARDIOLOGY    Employer: [employer]  Tobacco Use  . Smoking status: Former Smoker    Packs/day: 0.25    Years: 3.00    Pack years: 0.75    Quit date: 02/05/2007    Years since quitting: 13.2  . Smokeless tobacco: Never Used  Vaping Use  . Vaping Use: Never used  Substance and Sexual Activity  . Alcohol use: Yes      Comment: occassional wine 2-3 times a year  . Drug use: No  . Sexual activity: Yes    Birth control/protection: Surgical  Other Topics Concern  . Not on file  Social History Narrative   Lives Kristine Lawson husband-10 years    4 kids blended together   3 biologically hers.      No pets in the home   Enjoy: playing tennis, watching games on tv , hgtv      Diet: well balanced, eats a lot of protein, and veggies, tries to avoid sweets and carbs   Caffeine: 2-3 cups daily   Water: 2.5 16 oz bottles      Wear seat beat    Doesn't wear sunscreen  Smoke and carbon monoxide detectors    Uses speaker phone while driving, avoids texting.    Social Determinants of Health   Financial Resource Strain:   . Difficulty of Paying Living Expenses: Not on file  Food Insecurity:   . Worried About Charity fundraiser in the Last Year: Not on file  . Ran Out of Food in the Last Year: Not on file  Transportation Needs:   . Lack of Transportation (Medical): Not on file  . Lack of Transportation (Non-Medical): Not on file  Physical Activity:   . Days of Exercise per Week: Not on file  . Minutes of Exercise per Session: Not on file  Stress:   . Feeling of Stress : Not on file  Social Connections:   . Frequency of Communication with Friends and Family: Not on file  . Frequency of Social Gatherings with Friends and Family: Not on file  . Attends Religious Services: Not on file  . Active Member of Clubs or Organizations: Not on file  . Attends Archivist Meetings: Not on file  . Marital Status: Not on file  Intimate Partner Violence:   . Fear of Current or Ex-Partner: Not on file  . Emotionally Abused: Not on file  . Physically Abused: Not on file  . Sexually Abused: Not on file   Family History  Problem Relation Age of Onset  . Hypertension Mother   . Diabetes Mother   . Kidney disease Mother   . Hyperlipidemia Mother   . Colon cancer Mother 68       highly suspious of colon mass   . Breast cancer Mother 59  . Kidney disease Father   . Stroke Father   . Heart disease Father   . Hyperlipidemia Father   . Heart attack Father   . Diverticulitis Father   . Hypertension Brother   . Diabetes Brother   . Healthy Sister   . Other Daughter        hypertension  . Kidney disease Daughter   . Blindness Daughter   . Healthy Daughter   . Healthy Son   . Cancer Maternal Aunt 66       gallbladder cancer  . Breast cancer Paternal Aunt        dx. in her 27s  . Cervical cancer Maternal Grandmother        dx. in her 59s  . Prostate cancer Maternal Grandfather        metastatic  . Heart attack Paternal Grandmother   . Hypertension Paternal Grandmother   . Stroke Paternal Grandmother   . Lung cancer Paternal Grandfather   . Throat cancer Paternal Grandfather   . Breast cancer Paternal Aunt        dx. in her 17s  . Breast cancer Paternal Aunt        dx. in her 83s  . Throat cancer Paternal Uncle   . Lung cancer Paternal Uncle   . Breast cancer Other        dx. 31s or 60s (mother's first cousins x3)  . SIDS Maternal Aunt   . Prostate cancer Maternal Uncle        dx. in his 4s, not metastatic  . Prostate cancer Maternal Uncle        dx. in his 33s, not metastatic  . Breast cancer Cousin        dx. late 96s (paternal cousin)  . Breast cancer Other  dx. 40s/early 73s (maternal second cousins x2)     OBJECTIVE:  Vitals:   05/05/20 1316  BP: (!) 149/83  Pulse: 72  Resp: 16  Temp: 99.3 F (37.4 C)  TempSrc: Oral  SpO2: 95%    General appearance: Alert; uncomfortable, dry heaving upon entering the room HEENT: NCAT.  Oropharynx clear.  Lungs: clear to auscultation bilaterally without adventitious breath sounds Heart: regular rate and rhythm.   Abdomen: soft, non-distended; normal active bowel sounds; TTP over RUQ and epigastric region (pushes my hand away during exam); + guarding Extremities: no edema; symmetrical with no gross deformities Skin: warm  and dry Neurologic: normal gait Psychological: alert and cooperative; normal mood and affect   ASSESSMENT & PLAN:  1. RUQ pain   2. Non-intractable vomiting with nausea, unspecified vomiting type    Recommending further evaluation and management in the ED for RUQ pain.  Cannot treat obstruction in urgent care setting.  Patient aware and in agreement.  Declines EMS transport, husband will take her by private vehicle to ED.     Lestine Box, PA-C 05/05/20 1348

## 2020-05-06 ENCOUNTER — Encounter: Payer: Self-pay | Admitting: Family Medicine

## 2020-05-06 MED FILL — TAMSULOSIN HCL 0.4 MG CAP: 0.4 | 7 days supply | Qty: 7 | Fill #0

## 2020-05-06 NOTE — Telephone Encounter (Signed)
Please schedule pt for ER follow up

## 2020-05-07 ENCOUNTER — Ambulatory Visit (HOSPITAL_COMMUNITY)
Admission: RE | Admit: 2020-05-07 | Discharge: 2020-05-07 | Disposition: A | Discharge status: Home / Self Care | Payer: 59 | Source: Ambulatory Visit | Attending: Urology | Admitting: Urology

## 2020-05-07 ENCOUNTER — Ambulatory Visit (HOSPITAL_COMMUNITY): Payer: 59

## 2020-05-07 ENCOUNTER — Ambulatory Visit (HOSPITAL_COMMUNITY): Payer: 59 | Admitting: Anesthesiology

## 2020-05-07 ENCOUNTER — Encounter: Payer: Self-pay | Admitting: Family Medicine

## 2020-05-07 ENCOUNTER — Encounter (HOSPITAL_COMMUNITY)
Admission: EM | Disposition: A | Discharge status: Home / Self Care | Payer: Self-pay | Source: Ambulatory Visit | Attending: Urology

## 2020-05-07 ENCOUNTER — Other Ambulatory Visit: Payer: Self-pay

## 2020-05-07 ENCOUNTER — Encounter (HOSPITAL_COMMUNITY): Payer: Self-pay | Admitting: Urology

## 2020-05-07 ENCOUNTER — Other Ambulatory Visit: Payer: Self-pay | Admitting: Urology

## 2020-05-07 ENCOUNTER — Other Ambulatory Visit: Payer: Self-pay | Admitting: Family Medicine

## 2020-05-07 ENCOUNTER — Encounter: Payer: Self-pay | Admitting: Urology

## 2020-05-07 ENCOUNTER — Observation Stay (HOSPITAL_COMMUNITY)
Admission: EM | Admit: 2020-05-07 | Discharge: 2020-05-08 | Disposition: A | Discharge status: Home / Self Care | Payer: 59 | Source: Ambulatory Visit | Attending: Urology | Admitting: Urology

## 2020-05-07 ENCOUNTER — Other Ambulatory Visit (HOSPITAL_COMMUNITY)
Admission: RE | Admit: 2020-05-07 | Discharge: 2020-05-07 | Disposition: A | Discharge status: Home / Self Care | Payer: 59 | Source: Ambulatory Visit | Attending: Urology | Admitting: Urology

## 2020-05-07 ENCOUNTER — Ambulatory Visit (INDEPENDENT_AMBULATORY_CARE_PROVIDER_SITE_OTHER): Payer: 59 | Admitting: Urology

## 2020-05-07 ENCOUNTER — Ambulatory Visit (INDEPENDENT_AMBULATORY_CARE_PROVIDER_SITE_OTHER): Payer: 59 | Admitting: Family Medicine

## 2020-05-07 VITALS — BP 132/85 | HR 106 | Temp 100.6°F | Ht 68.0 in | Wt 235.0 lb

## 2020-05-07 VITALS — BP 120/74 | HR 88 | Temp 100.6°F | Resp 20 | Ht 68.0 in | Wt 235.0 lb

## 2020-05-07 DIAGNOSIS — I10 Essential (primary) hypertension: Secondary | ICD-10-CM | POA: Diagnosis not present

## 2020-05-07 DIAGNOSIS — Z79899 Other long term (current) drug therapy: Secondary | ICD-10-CM | POA: Diagnosis not present

## 2020-05-07 DIAGNOSIS — N1330 Unspecified hydronephrosis: Secondary | ICD-10-CM

## 2020-05-07 DIAGNOSIS — Z20822 Contact with and (suspected) exposure to covid-19: Secondary | ICD-10-CM | POA: Insufficient documentation

## 2020-05-07 DIAGNOSIS — N201 Calculus of ureter: Secondary | ICD-10-CM

## 2020-05-07 DIAGNOSIS — N200 Calculus of kidney: Secondary | ICD-10-CM

## 2020-05-07 DIAGNOSIS — A419 Sepsis, unspecified organism: Secondary | ICD-10-CM | POA: Diagnosis not present

## 2020-05-07 DIAGNOSIS — Z87442 Personal history of urinary calculi: Secondary | ICD-10-CM | POA: Diagnosis not present

## 2020-05-07 DIAGNOSIS — R5081 Fever presenting with conditions classified elsewhere: Secondary | ICD-10-CM | POA: Diagnosis not present

## 2020-05-07 DIAGNOSIS — N132 Hydronephrosis with renal and ureteral calculous obstruction: Secondary | ICD-10-CM | POA: Diagnosis not present

## 2020-05-07 DIAGNOSIS — R109 Unspecified abdominal pain: Secondary | ICD-10-CM | POA: Diagnosis not present

## 2020-05-07 DIAGNOSIS — N23 Unspecified renal colic: Secondary | ICD-10-CM

## 2020-05-07 DIAGNOSIS — Z01812 Encounter for preprocedural laboratory examination: Secondary | ICD-10-CM | POA: Insufficient documentation

## 2020-05-07 DIAGNOSIS — Z87891 Personal history of nicotine dependence: Secondary | ICD-10-CM | POA: Diagnosis not present

## 2020-05-07 DIAGNOSIS — I878 Other specified disorders of veins: Secondary | ICD-10-CM | POA: Diagnosis not present

## 2020-05-07 HISTORY — PX: CYSTOSCOPY W/ URETERAL STENT PLACEMENT: SHX1429

## 2020-05-07 HISTORY — PX: CYSTOSCOPY W/ RETROGRADES: SHX1426

## 2020-05-07 LAB — MICROSCOPIC EXAMINATION

## 2020-05-07 LAB — BASIC METABOLIC PANEL
Anion gap: 11 (ref 5–15)
BUN: 8 mg/dL (ref 6–20)
CO2: 24 mmol/L (ref 22–32)
Calcium: 8.2 mg/dL — ABNORMAL LOW (ref 8.9–10.3)
Chloride: 100 mmol/L (ref 98–111)
Creatinine, Ser: 0.85 mg/dL (ref 0.44–1.00)
GFR, Estimated: >60 mL/min (ref >60)
Glucose, Bld: 95 mg/dL (ref 70–99)
Potassium: 3.6 mmol/L (ref 3.5–5.1)
Sodium: 135 mmol/L (ref 135–145)

## 2020-05-07 LAB — URINALYSIS, ROUTINE W REFLEX MICROSCOPIC
Leukocytes,UA: NEGATIVE
Nitrite, UA: NEGATIVE
Specific Gravity, UA: 1.015 (ref 1.005–1.030)
Urobilinogen, Ur: >8 mg/dL — ABNORMAL HIGH (ref 0.2–1.0)
pH, UA: 6 (ref 5.0–7.5)

## 2020-05-07 LAB — CBC
HCT: 35.2 % — ABNORMAL LOW (ref 36.0–46.0)
Hemoglobin: 11.7 g/dL — ABNORMAL LOW (ref 12.0–15.0)
MCH: 29.7 pg (ref 26.0–34.0)
MCHC: 33.2 g/dL (ref 30.0–36.0)
MCV: 89.3 fL (ref 80.0–100.0)
Platelets: 296 10*3/uL (ref 150–400)
RBC: 3.94 MIL/uL (ref 3.87–5.11)
RDW: 12.8 % (ref 11.5–15.5)
WBC: 13.3 10*3/uL — ABNORMAL HIGH (ref 4.0–10.5)
nRBC: 0 % (ref 0.0–0.2)

## 2020-05-07 LAB — RESPIRATORY PANEL BY RT PCR (FLU A&B, COVID)
Influenza A by PCR: NEGATIVE
Influenza B by PCR: NEGATIVE
SARS Coronavirus 2 by RT PCR: NEGATIVE

## 2020-05-07 LAB — PREGNANCY, URINE: Preg Test, Ur: NEGATIVE

## 2020-05-07 SURGERY — CYSTOSCOPY, FLEXIBLE, WITH STENT REPLACEMENT
Anesthesia: General | Site: Ureter | Laterality: Right

## 2020-05-07 MED ORDER — SODIUM CHLORIDE 0.9 % IV SOLN
2.0000 g | INTRAVENOUS | Status: AC
  Administered 2020-05-07: 2 g via INTRAVENOUS

## 2020-05-07 MED ORDER — BELLADONNA ALKALOIDS-OPIUM 16.2-60 MG RE SUPP
1.0000 | Freq: Four times a day (QID) | RECTAL | Status: DC | PRN
  Filled 2020-05-07: qty 1

## 2020-05-07 MED ORDER — HYDROXYZINE HCL 25 MG PO TABS
75.0000 mg | ORAL_TABLET | Freq: Every day | ORAL | Status: DC
  Filled 2020-05-07: qty 1
  Filled 2020-05-07: qty 3
  Filled 2020-05-07: qty 1

## 2020-05-07 MED ORDER — HYDROMORPHONE HCL 1 MG/ML IJ SOLN: 0.5000 mg | INTRAMUSCULAR | Status: DC | PRN

## 2020-05-07 MED ORDER — LACTATED RINGERS IV SOLN
Freq: Once | INTRAVENOUS | Status: AC
  Administered 2020-05-07: 1000 mL via INTRAVENOUS

## 2020-05-07 MED ORDER — SODIUM CHLORIDE 0.9 % IV SOLN: INTRAVENOUS | Status: DC

## 2020-05-07 MED ORDER — DIATRIZOATE MEGLUMINE 30 % UR SOLN
URETHRAL | Status: AC
  Filled 2020-05-07: qty 100

## 2020-05-07 MED ORDER — SODIUM CHLORIDE 0.9 % IV SOLN
2.0000 g | INTRAVENOUS | Status: DC
  Filled 2020-05-07: qty 20

## 2020-05-07 MED ORDER — DIATRIZOATE MEGLUMINE 30 % UR SOLN
URETHRAL | Status: DC | PRN
  Administered 2020-05-07: 9 mL via URETHRAL

## 2020-05-07 MED ORDER — OXYCODONE-ACETAMINOPHEN 5-325 MG PO TABS
1.0000 | ORAL_TABLET | ORAL | Status: DC | PRN
  Administered 2020-05-07 – 2020-05-08 (×2): 2 via ORAL
  Filled 2020-05-07: qty 2
  Filled 2020-05-07: qty 1
  Filled 2020-05-07: qty 2

## 2020-05-07 MED ORDER — ACETAMINOPHEN 10 MG/ML IV SOLN
INTRAVENOUS | Status: DC | PRN
  Administered 2020-05-07: 1000 mg via INTRAVENOUS

## 2020-05-07 MED ORDER — LACTATED RINGERS IV SOLN: INTRAVENOUS | Status: DC | PRN

## 2020-05-07 MED ORDER — CHLORHEXIDINE GLUCONATE 0.12 % MT SOLN
15.0000 mL | Freq: Once | OROMUCOSAL | Status: AC
  Administered 2020-05-07: 15 mL via OROMUCOSAL

## 2020-05-07 MED ORDER — PHENTERMINE HCL 37.5 MG PO TABS: 37.5000 mg | ORAL_TABLET | Freq: Every day | ORAL | Status: DC

## 2020-05-07 MED ORDER — ZOLPIDEM TARTRATE 5 MG PO TABS
5.0000 mg | ORAL_TABLET | Freq: Every evening | ORAL | Status: DC | PRN
  Filled 2020-05-07: qty 1

## 2020-05-07 MED ORDER — MEPERIDINE HCL 50 MG/ML IJ SOLN: 6.2500 mg | INTRAMUSCULAR | Status: DC | PRN

## 2020-05-07 MED ORDER — HYDROCHLOROTHIAZIDE 12.5 MG PO CAPS
12.5000 mg | ORAL_CAPSULE | Freq: Every day | ORAL | Status: DC
  Filled 2020-05-07 (×3): qty 1

## 2020-05-07 MED ORDER — TRAZODONE HCL 50 MG PO TABS
50.0000 mg | ORAL_TABLET | Freq: Every day | ORAL | Status: DC
  Filled 2020-05-07 (×2): qty 1

## 2020-05-07 MED ORDER — PROPOFOL 10 MG/ML IV BOLUS
INTRAVENOUS | Status: AC
  Filled 2020-05-07: qty 20

## 2020-05-07 MED ORDER — ONDANSETRON HCL 4 MG/2ML IJ SOLN: 4.0000 mg | INTRAMUSCULAR | Status: DC | PRN

## 2020-05-07 MED ORDER — MIDAZOLAM HCL 2 MG/2ML IJ SOLN
INTRAMUSCULAR | Status: AC
  Filled 2020-05-07: qty 2

## 2020-05-07 MED ORDER — DIPHENHYDRAMINE HCL 12.5 MG/5ML PO ELIX
12.5000 mg | ORAL_SOLUTION | Freq: Four times a day (QID) | ORAL | Status: DC | PRN
  Filled 2020-05-07: qty 10

## 2020-05-07 MED ORDER — CEFPODOXIME PROXETIL 200 MG PO TABS: 200.0000 mg | ORAL_TABLET | Freq: Two times a day (BID) | ORAL | 0 refills | Status: DC

## 2020-05-07 MED ORDER — SODIUM CHLORIDE 0.9 % IR SOLN
Status: DC | PRN
  Administered 2020-05-07 (×2): 3000 mL

## 2020-05-07 MED ORDER — ACETAMINOPHEN 10 MG/ML IV SOLN
INTRAVENOUS | Status: AC
  Filled 2020-05-07: qty 100

## 2020-05-07 MED ORDER — HYDROMORPHONE HCL 1 MG/ML IJ SOLN: 0.2500 mg | INTRAMUSCULAR | Status: DC | PRN

## 2020-05-07 MED ORDER — ACETAMINOPHEN 325 MG PO TABS: 650.0000 mg | ORAL_TABLET | ORAL | Status: DC | PRN

## 2020-05-07 MED ORDER — PROPOFOL 10 MG/ML IV BOLUS
INTRAVENOUS | Status: AC
  Filled 2020-05-07: qty 40

## 2020-05-07 MED ORDER — PROMETHAZINE HCL 25 MG/ML IJ SOLN: 6.2500 mg | INTRAMUSCULAR | Status: DC | PRN

## 2020-05-07 MED ORDER — MIDAZOLAM HCL 2 MG/2ML IJ SOLN
INTRAMUSCULAR | Status: DC | PRN
  Administered 2020-05-07: 2 mg via INTRAVENOUS

## 2020-05-07 MED ORDER — STERILE WATER FOR IRRIGATION IR SOLN
Status: DC | PRN
  Administered 2020-05-07: 500 mL

## 2020-05-07 MED ORDER — ONDANSETRON HCL 4 MG/2ML IJ SOLN
INTRAMUSCULAR | Status: DC | PRN
  Administered 2020-05-07: 4 mg via INTRAVENOUS

## 2020-05-07 MED ORDER — DIPHENHYDRAMINE HCL 50 MG/ML IJ SOLN: 12.5000 mg | Freq: Four times a day (QID) | INTRAMUSCULAR | Status: DC | PRN

## 2020-05-07 MED ORDER — ORAL CARE MOUTH RINSE: 15.0000 mL | Freq: Once | OROMUCOSAL | Status: AC

## 2020-05-07 MED ORDER — FENTANYL CITRATE (PF) 250 MCG/5ML IJ SOLN
INTRAMUSCULAR | Status: AC
Start: 2020-05-07 — End: ?
  Filled 2020-05-07: qty 5

## 2020-05-07 MED ORDER — PANTOPRAZOLE SODIUM 40 MG PO TBEC
40.0000 mg | DELAYED_RELEASE_TABLET | Freq: Every day | ORAL | Status: DC
  Administered 2020-05-08: 40 mg via ORAL
  Filled 2020-05-07 (×3): qty 1

## 2020-05-07 MED ORDER — FENTANYL CITRATE (PF) 100 MCG/2ML IJ SOLN
INTRAMUSCULAR | Status: DC | PRN
Start: 2020-05-07 — End: 2020-05-07
  Administered 2020-05-07 (×3): 50 ug via INTRAVENOUS

## 2020-05-07 MED ORDER — SODIUM CHLORIDE 0.9 % IV SOLN
INTRAVENOUS | Status: AC
  Filled 2020-05-07: qty 20

## 2020-05-07 MED ORDER — PROPOFOL 500 MG/50ML IV EMUL
INTRAVENOUS | Status: DC | PRN
  Administered 2020-05-07: 100 ug/kg/min via INTRAVENOUS

## 2020-05-07 MED ORDER — FLUTICASONE PROPIONATE 93 MCG/ACT NA EXHU: 1.0000 | INHALANT_SUSPENSION | Freq: Two times a day (BID) | NASAL | Status: DC

## 2020-05-07 MED ORDER — IRBESARTAN 150 MG PO TABS
150.0000 mg | ORAL_TABLET | Freq: Every day | ORAL | Status: DC
  Filled 2020-05-07 (×3): qty 1

## 2020-05-07 MED ORDER — PROPOFOL 10 MG/ML IV BOLUS
INTRAVENOUS | Status: DC | PRN
  Administered 2020-05-07: 100 mg via INTRAVENOUS

## 2020-05-07 MED ORDER — OLMESARTAN MEDOXOMIL-HCTZ 20-12.5 MG PO TABS: 1.0000 | ORAL_TABLET | Freq: Every day | ORAL | Status: DC

## 2020-05-07 MED ORDER — LIDOCAINE 2% (20 MG/ML) 5 ML SYRINGE
INTRAMUSCULAR | Status: AC
  Filled 2020-05-07: qty 5

## 2020-05-07 SURGICAL SUPPLY — 22 items
BAG DRAIN URO TABLE W/ADPT NS (BAG) ×3 IMPLANT
BAG DRN 8 ADPR NS SKTRN CSTL (BAG) ×1
BAG HAMPER (MISCELLANEOUS) ×3 IMPLANT
CATH INTERMIT  6FR 70CM (CATHETERS) ×3 IMPLANT
CLOTH BEACON ORANGE TIMEOUT ST (SAFETY) ×3 IMPLANT
DECANTER SPIKE VIAL GLASS SM (MISCELLANEOUS) ×3 IMPLANT
GLOVE BIO SURGEON STRL SZ8 (GLOVE) ×6 IMPLANT
GLOVE BIOGEL PI IND STRL 7.0 (GLOVE) ×2 IMPLANT
GLOVE BIOGEL PI INDICATOR 7.0 (GLOVE) ×4
GOWN STRL REUS W/TWL LRG LVL3 (GOWN DISPOSABLE) ×3 IMPLANT
GOWN STRL REUS W/TWL XL LVL3 (GOWN DISPOSABLE) ×3 IMPLANT
GUIDEWIRE STR DUAL SENSOR (WIRE) ×3 IMPLANT
IV NS IRRIG 3000ML ARTHROMATIC (IV SOLUTION) ×6 IMPLANT
KIT TURNOVER CYSTO (KITS) ×3 IMPLANT
MANIFOLD NEPTUNE II (INSTRUMENTS) ×3 IMPLANT
PACK CYSTO (CUSTOM PROCEDURE TRAY) ×3 IMPLANT
PAD ARMBOARD 7.5X6 YLW CONV (MISCELLANEOUS) ×3 IMPLANT
STENT URET 6FRX26 CONTOUR (STENTS) ×3 IMPLANT
TOWEL OR 17X26 4PK STRL BLUE (TOWEL DISPOSABLE) ×3 IMPLANT
TRAY FOLEY W/BAG SLVR 16FR (SET/KITS/TRAYS/PACK) ×3
TRAY FOLEY W/BAG SLVR 16FR ST (SET/KITS/TRAYS/PACK) ×1 IMPLANT
WATER STERILE IRR 500ML POUR (IV SOLUTION) ×3 IMPLANT

## 2020-05-07 NOTE — Anesthesia Preprocedure Evaluation (Addendum)
Anesthesia Evaluation  Patient identified by MRN, date of birth, ID band Patient awake    Reviewed: Allergy & Precautions, NPO status , Patient's Chart, lab work & pertinent test results  History of Anesthesia Complications Negative for: history of anesthetic complications  Airway Mallampati: I  TM Distance: >3 FB Neck ROM: Full   Comment: Hypertrophied tonsils  Dental  (+) Dental Advisory Given, Teeth Intact   Pulmonary neg pulmonary ROS, former smoker,    Pulmonary exam normal breath sounds clear to auscultation       Cardiovascular Exercise Tolerance: Good hypertension, Pt. on medications Normal cardiovascular exam Rhythm:Regular Rate:Normal     Neuro/Psych  Neuromuscular disease negative psych ROS   GI/Hepatic Neg liver ROS, PUD, GERD  Medicated and Controlled,  Endo/Other  negative endocrine ROS  Renal/GU negative Renal ROS  negative genitourinary   Musculoskeletal  (+) Arthritis ,   Abdominal   Peds negative pediatric ROS (+)  Hematology  (+) anemia ,   Anesthesia Other Findings Phentermine - last dose 12/2019  Reproductive/Obstetrics negative OB ROS                           Anesthesia Physical Anesthesia Plan  ASA: II and emergent  Anesthesia Plan: General   Post-op Pain Management:    Induction: Intravenous  PONV Risk Score and Plan: TIVA  Airway Management Planned: Nasal Cannula, Natural Airway and Simple Face Mask  Additional Equipment:   Intra-op Plan:   Post-operative Plan:   Informed Consent: I have reviewed the patients History and Physical, chart, labs and discussed the procedure including the risks, benefits and alternatives for the proposed anesthesia with the patient or authorized representative who has indicated his/her understanding and acceptance.     Dental advisory given  Plan Discussed with: CRNA and Surgeon  Anesthesia Plan Comments:  (Possible GA with ETT/LMA was discussed.)       Anesthesia Quick Evaluation

## 2020-05-07 NOTE — Transfer of Care (Signed)
Immediate Anesthesia Transfer of Care Note  Patient: Kristine Lawson  Procedure(s) Performed: CYSTOSCOPY WITH RIGHT URETERAL STENT REPLACEMENT (Right Ureter) CYSTOSCOPY WITH RIGHT RETROGRADE PYELOGRAM (Right Ureter)  Patient Location: PACU  Anesthesia Type:General  Level of Consciousness: awake, alert , oriented and patient cooperative  Airway & Oxygen Therapy: Patient Spontanous Breathing and Patient connected to face mask oxygen  Post-op Assessment: Report given to RN  Post vital signs: Reviewed and stable  Last Vitals:  Vitals Value Taken Time  BP    Temp    Pulse 86 05/07/20 1554  Resp 26 05/07/20 1554  SpO2 94 % 05/07/20 1554  Vitals shown include unvalidated device data.  Last Pain:  Vitals:   05/07/20 1511  TempSrc: Oral  PainSc: 4       Patients Stated Pain Goal: 8 (05/27/14 9458)  Complications: No complications documented.

## 2020-05-07 NOTE — Patient Instructions (Signed)
With MD in 2 to 3 weeks, call if you need me sooner  You are referred urgently to Urology re right stone, with hydronephrosis and acute kidney injury  Need CXR and abdominal X ray today, due to bloating and pain  Need stat CBC and diff, cmp and EGFR , amylase and lip[ase stat at the hospital today   I will call you with results of x ray and labs this afternoon  Work excuse to return 05/11/2020 or later depending on Urology, starting from 05/04/2020  [facility] you feel better soon

## 2020-05-07 NOTE — Op Note (Signed)
.  Preoperative diagnosis: right UVJ stone, sepsis  Postoperative diagnosis: Same  Procedure: 1 cystoscopy 2. right retrograde pyelography 3.  Intraoperative fluoroscopy, under one hour, with interpretation 4. right 6 x 26 JJ stent placement  Attending: Nicolette Bang  Anesthesia: General  Estimated blood loss: None  Drains: Right 6 x 26 JJ ureteral stent without tether, 16 French foley catheter  Specimens: none  Antibiotics: Rocephin  Findings:right UVJ stone. Moderate hydronephrosis. No masses/lesions in the bladder. Ureteral orifices in normal anatomic location.  Indications: Patient is a 49 year old female with a history of left renal stone and concern for sepsis.  After discussing treatment options, they decided proceed with left stent placement.  Procedure her in detail: The patient was brought to the operating room and a brief timeout was done to ensure correct patient, correct procedure, correct site.  General anesthesia was administered patient was placed in dorsal lithotomy position.  Their genitalia was then prepped and draped in usual sterile fashion.  A rigid 76 French cystoscope was passed in the urethra and the bladder.  Bladder was inspected free masses or lesions.  the ureteral orifices were in the normal orthotopic locations.  a 6 french ureteral catheter was then instilled into the right ureteral orifice.  a gentle retrograde was obtained and findings noted above.  we then placed a zip wire through the ureteral catheter and advanced up to the renal pelvis.    We then placed a 6 x 26 double-j ureteral stent over the original zip wire.  We then removed the wire and good coil was noted in the the renal pelvis under fluoroscopy and the bladder under direct vision.  A foley catheter was then placed. the bladder was then drained and this concluded the procedure which was well tolerated by patient.  Complications: None  Condition: Stable, extubated, transferred to  PACU  Plan: Patient is to be admitted for IV antibiotics. He will have his stone extraction in 2 weeks.

## 2020-05-07 NOTE — Interval H&P Note (Signed)
History and Physical Interval Note:  05/07/2020 2:22 PM  Kristine Lawson  has presented today for surgery, with the diagnosis of right ureteral calculus.  The various methods of treatment have been discussed with the patient and family. After consideration of risks, benefits and other options for treatment, the patient has consented to  Procedure(s): CYSTOSCOPY WITH STENT REPLACEMENT (Right) CYSTOSCOPY WITH RETROGRADE PYELOGRAM (Right) as a surgical intervention.  The patient's history has been reviewed, patient examined, no change in status, stable for surgery.  I have reviewed the patient's chart and labs.  Questions were answered to the patient's satisfaction.     Nicolette Bang

## 2020-05-07 NOTE — Progress Notes (Signed)
05/07/2020 12:51 PM   Alma Friendly Bethann Punches [birth year] [id number]  Referring provider: Fayrene Helper, MD [address], [place],  [place] [address]  Nephrolithiasis  HPI: Ms Ard is a 49yo here for evaluation of nephrolithiasis. Monday she developed sharp, severe right flank pain. She presented to the ER and was diagnosed with a 45mm right mid ureteral calculus. She denies any LUTS. No hematuria or dysuria. Urine shows RBCs. UA from ER was normal. She has a temp of 100.6 currently. She feels fatigued    PMH: Past Medical History:  Diagnosis Date   Allergy    Anemia    IDA   Arthritis    Family history of breast cancer    Family history of cancer of gallbladder    Family history of cervical cancer    Family history of lung cancer    Family history of prostate cancer    Family history of throat cancer    Hypertension    Insomnia    meds needed daily to achieve 6+ hours sleep.    Surgical History: Past Surgical History:  Procedure Laterality Date   ABLATION     uterine ablation   APPENDECTOMY     BIOPSY  12/18/2019   Procedure: BIOPSY;  Surgeon: Rogene Houston, MD;  Location: [facility];  Service: Endoscopy;;  gastric   BREATH TEK H PYLORI N/A 11/18/2014   Procedure: BREATH TEK Kandis Ban;  Surgeon: Greer Pickerel, MD;  Location: [facility];  Service: General;  Laterality: N/A;   CHOLECYSTECTOMY     COLONOSCOPY N/A 12/18/2019   Procedure: COLONOSCOPY;  Surgeon: Rogene Houston, MD;  Location: [facility];  Service: Endoscopy;  Laterality: N/A;  930   ESOPHAGOGASTRODUODENOSCOPY N/A 10/01/2018   Procedure: ESOPHAGOGASTRODUODENOSCOPY (EGD);  Surgeon: Rogene Houston, MD;  [facility]: [facility];  Service: Endoscopy;  Laterality: N/A;   ESOPHAGOGASTRODUODENOSCOPY N/A 12/18/2019   Procedure: ESOPHAGOGASTRODUODENOSCOPY (EGD);  Surgeon: Rogene Houston, MD;  [facility]: [facility];  Service: Endoscopy;  Laterality: N/A;   LAPAROSCOPIC  ROUX-EN-Y GASTRIC BYPASS WITH HIATAL HERNIA REPAIR N/A 02/10/2015   Procedure: LAPAROSCOPIC ROUX-EN-Y GASTRIC BYPASS ;  Surgeon: Greer Pickerel, MD;  Location: [facility];  Service: General;  Laterality: N/A;   TUBAL LIGATION     UPPER GI ENDOSCOPY N/A 02/10/2015   Procedure: UPPER GI ENDOSCOPY;  Surgeon: Greer Pickerel, MD;  Location: [facility];  Service: General;  Laterality: N/A;    Home Medications:  Allergies as of 05/07/2020      Reactions   Lisinopril Cough   Aspirin Hives   Goody Powder-   Triamterene Other (See Comments)   Cramps   Other Hives      Medication List       Accurate as of May 07, 2020 12:51 PM. If you have any questions, ask your nurse or doctor.        acyclovir 400 MG tablet Commonly known as: ZOVIRAX Take 1 tablet (400 mg total) by mouth as needed.   [employer] by mouth daily.   Bactrim DS 800-160 MG tablet Generic drug: sulfamethoxazole-trimethoprim Take 1 tablet by mouth 2 (two) times daily.   CITRACAL PO Take 500 mg by mouth 3 (three) times daily.   cyclobenzaprine 10 MG tablet Commonly known as: FLEXERIL Take 1 tablet (10 mg total) by mouth at bedtime. What changed:   when to take this  reasons to take this   diphenhydrAMINE 50 MG tablet Commonly known as:  BENADRYL Take 50 mg by mouth at bedtime as needed for itching.   ergocalciferol 1.25 MG (50000 UT) capsule Commonly known as: VITAMIN D2 Take 1 capsule (50,000 Units total) by mouth once a week. One capsule once weekly   fluconazole 150 MG tablet Commonly known as: DIFLUCAN Take one tablet by mouth once weekly , as needed, for vaginal itch associated with yeats infection   hydrOXYzine 50 MG tablet Commonly known as: ATARAX/VISTARIL TAKE 3 TABLETS BY MOUTH AT BEDTIME What changed: how much to take   multivitamin tablet Take 1 tablet by mouth daily.   olmesartan-hydrochlorothiazide 20-12.5 MG tablet Commonly known as: BENICAR HCT Take 1 tablet by mouth daily.     ondansetron 4 MG disintegrating tablet Commonly known as: Zofran ODT Take 1 tablet (4 mg total) by mouth every 8 (eight) hours as needed for nausea or vomiting.   oxyCODONE-acetaminophen 5-325 MG tablet Commonly known as: PERCOCET/ROXICET Take 1 tablet by mouth every 6 (six) hours as needed for severe pain.   pantoprazole 40 MG tablet Commonly known as: PROTONIX Take 1 tablet (40 mg total) by mouth daily before breakfast.   phentermine 37.5 MG tablet Commonly known as: ADIPEX-P Take one half tablet by mouth every morning at breakfast   polycarbophil 625 MG tablet Commonly known as: FIBERCON Take 1,250 mg by mouth daily.   tamsulosin 0.4 MG Caps capsule Commonly known as: FLOMAX Take 1 capsule (0.4 mg total) by mouth daily after supper for 7 days.   traZODone 50 MG tablet Commonly known as: DESYREL TAKE 1 TABLET BY MOUTH AT BEDTIME   Xhance 93 MCG/ACT Exhu Generic drug: Fluticasone Propionate Place 1 puff into the nose in the morning and at bedtime.       Allergies:  Allergies  Allergen Reactions   Lisinopril Cough   Aspirin Hives    Goody Powder-   Triamterene Other (See Comments)    Cramps    Other Hives    Family History: Family History  Problem Relation Age of Onset   Hypertension Mother    Diabetes Mother    Kidney disease Mother    Hyperlipidemia Mother    Colon cancer Mother 34       highly suspious of colon mass   Breast cancer Mother 69   Kidney disease Father    Stroke Father    Heart disease Father    Hyperlipidemia Father    Heart attack Father    Diverticulitis Father    Hypertension Brother    Diabetes Brother    Healthy Sister    Other Daughter        hypertension   Kidney disease Daughter    Blindness Daughter    Healthy Daughter    Healthy Son    Cancer Maternal Aunt 66       gallbladder cancer   Breast cancer Paternal Aunt        dx. in her 83s   Cervical cancer Maternal Grandmother        dx.  in her 24s   Prostate cancer Maternal Grandfather        metastatic   Heart attack Paternal Grandmother    Hypertension Paternal Grandmother    Stroke Paternal Grandmother    Lung cancer Paternal Grandfather    Throat cancer Paternal Grandfather    Breast cancer Paternal Aunt        dx. in her 33s   Breast cancer Paternal Aunt        dx. in  her 59s   Throat cancer Paternal Uncle    Lung cancer Paternal Uncle    Breast cancer Other        dx. 29s or 6s (mother's first cousins x3)   SIDS Maternal Aunt    Prostate cancer Maternal Uncle        dx. in his 11s, not metastatic   Prostate cancer Maternal Uncle        dx. in his 61s, not metastatic   Breast cancer Cousin        dx. late 87s (paternal cousin)   Breast cancer Other        dx. 40s/early 54s (maternal second cousins x2)    Social History:  reports that she quit smoking about 13 years ago. She has a 0.75 pack-year smoking history. She has never used smokeless tobacco. She reports current alcohol use. She reports that she does not use drugs.  ROS: All other review of systems were reviewed and are negative except what is noted above in HPI  Physical Exam: BP 132/85    Pulse (!) 106    Temp (!) 100.6 F (38.1 C)    Ht 5\' 8"  (1.727 m)    Wt 235 lb (106.6 kg)    BMI 35.73 kg/m   Constitutional:  Alert and oriented, No acute distress. HEENT: [place] AT, moist mucus membranes.  Trachea midline, no masses. Cardiovascular: No clubbing, cyanosis, or edema. Respiratory: Normal respiratory effort, no increased work of breathing. GI: Abdomen is soft, nontender, nondistended, no abdominal masses GU: No CVA tenderness.  Lymph: No cervical or inguinal lymphadenopathy. Skin: No rashes, bruises or suspicious lesions. Neurologic: Grossly intact, no focal deficits, moving all 4 extremities. Psychiatric: Normal mood and affect.  Laboratory Data: Lab Results  Component Value Date   WBC 7.0 05/05/2020   HGB 13.5 05/05/2020    HCT 41.3 05/05/2020   MCV 88.8 05/05/2020   PLT 383 05/05/2020    Lab Results  Component Value Date   CREATININE 1.17 (H) 05/05/2020    No results found for: PSA  No results found for: TESTOSTERONE  Lab Results  Component Value Date   HGBA1C 5.6 03/23/2015    Urinalysis    Component Value Date/Time   COLORURINE YELLOW 05/05/2020 1401   APPEARANCEUR HAZY (A) 05/05/2020 1401   LABSPEC 1.028 05/05/2020 1401   PHURINE 5.0 05/05/2020 1401   GLUCOSEU NEGATIVE 05/05/2020 1401   HGBUR NEGATIVE 05/05/2020 1401    [place] 05/05/2020 1401   BILIRUBINUR negative 01/10/2019 1026   BILIRUBINUR neg 12/15/2015 1355   KETONESUR 5 (A) 05/05/2020 1401   PROTEINUR NEGATIVE 05/05/2020 1401   UROBILINOGEN 0.2 01/10/2019 1026   UROBILINOGEN 0.2 08/29/2012 2335   NITRITE NEGATIVE 05/05/2020 1401   LEUKOCYTESUR NEGATIVE 05/05/2020 1401    No results found for: LABMICR, WBCUA, RBCUA, LABEPIT, MUCUS, BACTERIA  Pertinent Imaging: KUB today: Images reviewed and dicussed with the patient No results found for this or any previous visit.  No results found for this or any previous visit.  No results found for this or any previous visit.  No results found for this or any previous visit.  No results found for this or any previous visit.  No results found for this or any previous visit.  No results found for this or any previous visit.  No results found for this or any previous visit.   Assessment & Plan:    1. Nephrolithiasis -We discussed the management of kidney stones. These options include observation, ureteroscopy,  shockwave lithotripsy (ESWL) and percutaneous nephrolithotomy (PCNL). We discussed which options are relevant to the patient's stone(s). We discussed the natural history of kidney stones as well as the complications of untreated stones and the impact on quality of life without treatment as well as with each of the above listed treatments. We also discussed  the efficacy of each treatment in its ability to clear the stone burden. With any of these management options I discussed the signs and symptoms of infection and the need for emergent treatment should these be experienced. For each option we discussed the ability of each procedure to clear the patient of their stone burden.   For observation I described the risks which include but are not limited to silent renal damage, life-threatening infection, need for emergent surgery, failure to pass stone and pain.   For ureteroscopy I described the risks which include bleeding, infection, damage to contiguous structures, positioning injury, ureteral stricture, ureteral avulsion, ureteral injury, need for prolonged ureteral stent, inability to perform ureteroscopy, need for an interval procedure, inability to clear stone burden, stent discomfort/pain, heart attack, stroke, pulmonary embolus and the inherent risks with general anesthesia.   For shockwave lithotripsy I described the risks which include arrhythmia, kidney contusion, kidney hemorrhage, need for transfusion, pain, inability to adequately break up stone, inability to pass stone fragments, Steinstrasse, infection associated with obstructing stones, need for alternate surgical procedure, need for repeat shockwave lithotripsy, MI, CVA, PE and the inherent risks with anesthesia/conscious sedation.   For PCNL I described the risks including positioning injury, pneumothorax, hydrothorax, need for chest tube, inability to clear stone burden, renal laceration, arterial venous fistula or malformation, need for embolization of kidney, loss of kidney or renal function, need for repeat procedure, need for prolonged nephrostomy tube, ureteral avulsion, MI, CVA, PE and the inherent risks of general anesthesia.   - The patient would like to proceed with right ureteral stent placement  No follow-ups on file.  Nicolette Bang, MD  [facility] [place]

## 2020-05-07 NOTE — Patient Instructions (Signed)
Lithotripsy  Lithotripsy is a treatment that can sometimes help eliminate kidney stones and the pain that they cause. A form of lithotripsy, also known as extracorporeal shock wave lithotripsy, is a nonsurgical procedure that crushes a kidney stone with shock waves. These shock waves pass through your body and focus on the kidney stone. They cause the kidney stone to break up while it is still in the urinary tract. This makes it easier for the smaller pieces of stone to pass in the urine. Tell a health care provider about:  Any allergies you have.  All medicines you are taking, including vitamins, herbs, eye drops, creams, and over-the-counter medicines.  Any blood disorders you have.  Any surgeries you have had.  Any medical conditions you have.  Whether you are pregnant or may be pregnant.  Any problems you or family members have had with anesthetic medicines. What are the risks? Generally, this is a safe procedure. However, problems may occur, including:  Infection.  Bleeding of the kidney.  Bruising of the kidney or skin.  Scarring of the kidney, which can lead to: ? Increased blood pressure. ? Poor kidney function. ? Return (recurrence) of kidney stones.  Damage to other structures or organs, such as the liver, colon, spleen, or pancreas.  Blockage (obstruction) of the the tube that carries urine from the kidney to the bladder (ureter).  Failure of the kidney stone to break into pieces (fragments). What happens before the procedure? Staying hydrated Follow instructions from your health care provider about hydration, which may include:  Up to 2 hours before the procedure - you may continue to drink clear liquids, such as water, clear fruit juice, black coffee, and plain tea. Eating and drinking restrictions Follow instructions from your health care provider about eating and drinking, which may include:  8 hours before the procedure - stop eating heavy meals or foods  such as meat, fried foods, or fatty foods.  6 hours before the procedure - stop eating light meals or foods, such as toast or cereal.  6 hours before the procedure - stop drinking milk or drinks that contain milk.  2 hours before the procedure - stop drinking clear liquids. General instructions  Plan to have someone take you home from the hospital or clinic.  Ask your health care provider about: ? Changing or stopping your regular medicines. This is especially important if you are taking diabetes medicines or blood thinners. ? Taking medicines such as aspirin and ibuprofen. These medicines and other NSAIDs can thin your blood. Do not take these medicines for 7 days before your procedure if your health care provider instructs you not to.  You may have tests, such as: ? Blood tests. ? Urine tests. ? Imaging tests, such as a CT scan. What happens during the procedure?  To lower your risk of infection: ? Your health care team will wash or sanitize their hands. ? Your skin will be washed with soap.  An IV tube will be inserted into one of your veins. This tube will give you fluids and medicines.  You will be given one or more of the following: ? A medicine to help you relax (sedative). ? A medicine to make you fall asleep (general anesthetic).  A water-filled cushion may be placed behind your kidney or on your abdomen. In some cases you may be placed in a tub of lukewarm water.  Your body will be positioned in a way that makes it easy to target the kidney   stone.  A flexible tube with holes in it (stent) may be placed in the ureter. This will help keep urine flowing from the kidney if the fragments of the stone have been blocking the ureter.  An X-ray or ultrasound exam will be done to locate your stone.  Shock waves will be aimed at the stone. If you are awake, you may feel a tapping sensation as the shock waves pass through your body. The procedure may vary among health care  providers and hospitals. What happens after the procedure?  You may have an X-ray to see whether the procedure was able to break up the kidney stone and how much of the stone has passed. If large stone fragments remain after treatment, you may need to have a second procedure at a later time.  Your blood pressure, heart rate, breathing rate, and blood oxygen level will be monitored until the medicines you were given have worn off.  You may be given antibiotics or pain medicine as needed.  If a stent was placed in your ureter during surgery, it may stay in place for a few weeks.  You may need strain your urine to collect pieces of the kidney stone for testing.  You will need to drink plenty of water.  Do not drive for 24 hours if you were given a sedative. Summary  Lithotripsy is a treatment that can sometimes help eliminate kidney stones and the pain that they cause.  A form of lithotripsy, also known as extracorporeal shock wave lithotripsy, is a nonsurgical procedure that crushes a kidney stone with shock waves.  Generally, this is a safe procedure. However, problems may occur, including damage to the kidney or other organs, infection, or obstruction of the tube that carries urine from the kidney to the bladder (ureter).  When you go home, you will need to drink plenty of water. You may be asked to strain your urine to collect pieces of the kidney stone for testing. This information is not intended to replace advice given to you by your health care provider. Make sure you discuss any questions you have with your health care provider. Document Revised: 10/22/2018 Document Reviewed: 06/01/2016 [employer] Patient Education  2020 [employer]  

## 2020-05-07 NOTE — H&P (View-Only) (Signed)
05/07/2020 12:51 PM   Kristine Lawson 08-30-1970 [id number]  Referring provider: Fayrene Helper, MD [address] [place],  [place] [address]  Nephrolithiasis  HPI: Ms Kristine Lawson is a 49yo here for evaluation of nephrolithiasis. Monday she developed sharp, severe right flank pain. She presented to the ER and was diagnosed with a 73mm right mid ureteral calculus. She denies any LUTS. No hematuria or dysuria. Urine shows RBCs. UA from ER was normal. She has a temp of 100.6 currently. She feels fatigued    PMH: Past Medical History:  Diagnosis Date   Allergy    Anemia    IDA   Arthritis    Family history of breast cancer    Family history of cancer of gallbladder    Family history of cervical cancer    Family history of lung cancer    Family history of prostate cancer    Family history of throat cancer    Hypertension    Insomnia    meds needed daily to achieve 6+ hours sleep.    Surgical History: Past Surgical History:  Procedure Laterality Date   ABLATION     uterine ablation   APPENDECTOMY     BIOPSY  12/18/2019   Procedure: BIOPSY;  Surgeon: Rogene Houston, MD;  [facility];  Service: Endoscopy;;  gastric   BREATH TEK H PYLORI N/A 11/18/2014   Procedure: BREATH TEK Kandis Ban;  Surgeon: Greer Pickerel, MD;  Location[facility];  Service: General;  Laterality: N/A;   CHOLECYSTECTOMY     COLONOSCOPY N/A 12/18/2019   Procedure: COLONOSCOPY;  Surgeon: Rogene Houston, MD;  [facility];  Service: Endoscopy;  Laterality: N/A;  930   ESOPHAGOGASTRODUODENOSCOPY N/A 10/01/2018   Procedure: ESOPHAGOGASTRODUODENOSCOPY (EGD);  Surgeon: Rogene Houston, MD;  [facility];  Service: Endoscopy;  Laterality: N/A;   ESOPHAGOGASTRODUODENOSCOPY N/A 12/18/2019   Procedure: ESOPHAGOGASTRODUODENOSCOPY (EGD);  Surgeon: Rogene Houston, MD;  [facility];  Service: Endoscopy;  Laterality: N/A;   LAPAROSCOPIC  ROUX-EN-Y GASTRIC BYPASS WITH HIATAL HERNIA REPAIR N/A 02/10/2015   Procedure: LAPAROSCOPIC ROUX-EN-Y GASTRIC BYPASS ;  Surgeon: Greer Pickerel, MD;  [facility];  Service: General;  Laterality: N/A;   TUBAL LIGATION     UPPER GI ENDOSCOPY N/A 02/10/2015   Procedure: UPPER GI ENDOSCOPY;  Surgeon: Greer Pickerel, MD;  [facility];  Service: General;  Laterality: N/A;    Home Medications:  Allergies as of 05/07/2020      Reactions   Lisinopril Cough   Aspirin Hives   Goody Powder-   Triamterene Other (See Comments)   Cramps   Other Hives      Medication List       Accurate as of May 07, 2020 12:51 PM. If you have any questions, ask your nurse or doctor.        acyclovir 400 MG tablet Commonly known as: ZOVIRAX Take 1 tablet (400 mg total) by mouth as needed.   [employer] by mouth daily.   Bactrim DS 800-160 MG tablet Generic drug: sulfamethoxazole-trimethoprim Take 1 tablet by mouth 2 (two) times daily.   CITRACAL PO Take 500 mg by mouth 3 (three) times daily.   cyclobenzaprine 10 MG tablet Commonly known as: FLEXERIL Take 1 tablet (10 mg total) by mouth at bedtime. What changed:   when to take this  reasons to take this   diphenhydrAMINE 50 MG tablet Commonly known as:  BENADRYL Take 50 mg by mouth at bedtime as needed for itching.   ergocalciferol 1.25 MG (50000 UT) capsule Commonly known as: VITAMIN D2 Take 1 capsule (50,000 Units total) by mouth once a week. One capsule once weekly   fluconazole 150 MG tablet Commonly known as: DIFLUCAN Take one tablet by mouth once weekly , as needed, for vaginal itch associated with yeats infection   hydrOXYzine 50 MG tablet Commonly known as: ATARAX/VISTARIL TAKE 3 TABLETS BY MOUTH AT BEDTIME What changed: how much to take   multivitamin tablet Take 1 tablet by mouth daily.   olmesartan-hydrochlorothiazide 20-12.5 MG tablet Commonly known as: BENICAR HCT Take 1 tablet by mouth daily.     ondansetron 4 MG disintegrating tablet Commonly known as: Zofran ODT Take 1 tablet (4 mg total) by mouth every 8 (eight) hours as needed for nausea or vomiting.   oxyCODONE-acetaminophen 5-325 MG tablet Commonly known as: PERCOCET/ROXICET Take 1 tablet by mouth every 6 (six) hours as needed for severe pain.   pantoprazole 40 MG tablet Commonly known as: PROTONIX Take 1 tablet (40 mg total) by mouth daily before breakfast.   phentermine 37.5 MG tablet Commonly known as: ADIPEX-P Take one half tablet by mouth every morning at breakfast   polycarbophil 625 MG tablet Commonly known as: FIBERCON Take 1,250 mg by mouth daily.   tamsulosin 0.4 MG Caps capsule Commonly known as: FLOMAX Take 1 capsule (0.4 mg total) by mouth daily after supper for 7 days.   traZODone 50 MG tablet Commonly known as: DESYREL TAKE 1 TABLET BY MOUTH AT BEDTIME   Xhance 93 MCG/ACT Exhu Generic drug: Fluticasone Propionate Place 1 puff into the nose in the morning and at bedtime.       Allergies:  Allergies  Allergen Reactions   Lisinopril Cough   Aspirin Hives    Goody Powder-   Triamterene Other (See Comments)    Cramps    Other Hives    Family History: Family History  Problem Relation Age of Onset   Hypertension Mother    Diabetes Mother    Kidney disease Mother    Hyperlipidemia Mother    Colon cancer Mother 27       highly suspious of colon mass   Breast cancer Mother 45   Kidney disease Father    Stroke Father    Heart disease Father    Hyperlipidemia Father    Heart attack Father    Diverticulitis Father    Hypertension Brother    Diabetes Brother    Healthy Sister    Other Daughter        hypertension   Kidney disease Daughter    Blindness Daughter    Healthy Daughter    Healthy Son    Cancer Maternal Aunt 66       gallbladder cancer   Breast cancer Paternal Aunt        dx. in her 73s   Cervical cancer Maternal Grandmother        dx.  in her 53s   Prostate cancer Maternal Grandfather        metastatic   Heart attack Paternal Grandmother    Hypertension Paternal Grandmother    Stroke Paternal Grandmother    Lung cancer Paternal Grandfather    Throat cancer Paternal Grandfather    Breast cancer Paternal Aunt        dx. in her 78s   Breast cancer Paternal Aunt        dx. in  her 48s   Throat cancer Paternal Uncle    Lung cancer Paternal Uncle    Breast cancer Other        dx. 70s or 54s (mother's first cousins x3)   SIDS Maternal Aunt    Prostate cancer Maternal Uncle        dx. in his 4s, not metastatic   Prostate cancer Maternal Uncle        dx. in his 58s, not metastatic   Breast cancer Cousin        dx. late 71s (paternal cousin)   Breast cancer Other        dx. 40s/early 33s (maternal second cousins x2)    Social History:  reports that she quit smoking about 13 years ago. She has a 0.75 pack-year smoking history. She has never used smokeless tobacco. She reports current alcohol use. She reports that she does not use drugs.  ROS: All other review of systems were reviewed and are negative except what is noted above in HPI  Physical Exam: BP 132/85    Pulse (!) 106    Temp (!) 100.6 F (38.1 C)    Ht 5\' 8"  (1.727 m)    Wt 235 lb (106.6 kg)    BMI 35.73 kg/m   Constitutional:  Alert and oriented, No acute distress. HEENT: [place] AT, moist mucus membranes.  Trachea midline, no masses. Cardiovascular: No clubbing, cyanosis, or edema. Respiratory: Normal respiratory effort, no increased work of breathing. GI: Abdomen is soft, nontender, nondistended, no abdominal masses GU: No CVA tenderness.  Lymph: No cervical or inguinal lymphadenopathy. Skin: No rashes, bruises or suspicious lesions. Neurologic: Grossly intact, no focal deficits, moving all 4 extremities. Psychiatric: Normal mood and affect.  Laboratory Data: Lab Results  Component Value Date   WBC 7.0 05/05/2020   HGB 13.5 05/05/2020    HCT 41.3 05/05/2020   MCV 88.8 05/05/2020   PLT 383 05/05/2020    Lab Results  Component Value Date   CREATININE 1.17 (H) 05/05/2020    No results found for: PSA  No results found for: TESTOSTERONE  Lab Results  Component Value Date   HGBA1C 5.6 03/23/2015    Urinalysis    Component Value Date/Time   COLORURINE YELLOW 05/05/2020 1401   APPEARANCEUR HAZY (A) 05/05/2020 1401   LABSPEC 1.028 05/05/2020 1401   PHURINE 5.0 05/05/2020 1401   GLUCOSEU NEGATIVE 05/05/2020 1401   HGBUR NEGATIVE 05/05/2020 1401   [place] 05/05/2020 1401   BILIRUBINUR negative 01/10/2019 1026   BILIRUBINUR neg 12/15/2015 1355   KETONESUR 5 (A) 05/05/2020 1401   PROTEINUR NEGATIVE 05/05/2020 1401   UROBILINOGEN 0.2 01/10/2019 1026   UROBILINOGEN 0.2 08/29/2012 2335   NITRITE NEGATIVE 05/05/2020 1401   LEUKOCYTESUR NEGATIVE 05/05/2020 1401    No results found for: LABMICR, WBCUA, RBCUA, LABEPIT, MUCUS, BACTERIA  Pertinent Imaging: KUB today: Images reviewed and dicussed with the patient No results found for this or any previous visit.  No results found for this or any previous visit.  No results found for this or any previous visit.  No results found for this or any previous visit.  No results found for this or any previous visit.  No results found for this or any previous visit.  No results found for this or any previous visit.  No results found for this or any previous visit.   Assessment & Plan:    1. Nephrolithiasis -We discussed the management of kidney stones. These options include observation, ureteroscopy,  shockwave lithotripsy (ESWL) and percutaneous nephrolithotomy (PCNL). We discussed which options are relevant to the patient's stone(s). We discussed the natural history of kidney stones as well as the complications of untreated stones and the impact on quality of life without treatment as well as with each of the above listed treatments. We also discussed  the efficacy of each treatment in its ability to clear the stone burden. With any of these management options I discussed the signs and symptoms of infection and the need for emergent treatment should these be experienced. For each option we discussed the ability of each procedure to clear the patient of their stone burden.   For observation I described the risks which include but are not limited to silent renal damage, life-threatening infection, need for emergent surgery, failure to pass stone and pain.   For ureteroscopy I described the risks which include bleeding, infection, damage to contiguous structures, positioning injury, ureteral stricture, ureteral avulsion, ureteral injury, need for prolonged ureteral stent, inability to perform ureteroscopy, need for an interval procedure, inability to clear stone burden, stent discomfort/pain, heart attack, stroke, pulmonary embolus and the inherent risks with general anesthesia.   For shockwave lithotripsy I described the risks which include arrhythmia, kidney contusion, kidney hemorrhage, need for transfusion, pain, inability to adequately break up stone, inability to pass stone fragments, Steinstrasse, infection associated with obstructing stones, need for alternate surgical procedure, need for repeat shockwave lithotripsy, MI, CVA, PE and the inherent risks with anesthesia/conscious sedation.   For PCNL I described the risks including positioning injury, pneumothorax, hydrothorax, need for chest tube, inability to clear stone burden, renal laceration, arterial venous fistula or malformation, need for embolization of kidney, loss of kidney or renal function, need for repeat procedure, need for prolonged nephrostomy tube, ureteral avulsion, MI, CVA, PE and the inherent risks of general anesthesia.   - The patient would like to proceed with right ureteral stent placement  No follow-ups on file.  Nicolette Bang, MD  [facility] [place]

## 2020-05-07 NOTE — H&P (View-Only) (Signed)
05/07/2020 12:51 PM   Kristine Lawson Bethann Punches 03-03-1971 [id number]  Referring provider: Fayrene Helper, MD [address] [place],  [place] [address]  Nephrolithiasis  HPI: Ms Vanasten is a 49yo here for evaluation of nephrolithiasis. Monday she developed sharp, severe right flank pain. She presented to the ER and was diagnosed with a 51mm right mid ureteral calculus. She denies any LUTS. No hematuria or dysuria. Urine shows RBCs. UA from ER was normal. She has a temp of 100.6 currently. She feels fatigued    PMH: Past Medical History:  Diagnosis Date  . Allergy   . Anemia    IDA  . Arthritis   . Family history of breast cancer   . Family history of cancer of gallbladder   . Family history of cervical cancer   . Family history of lung cancer   . Family history of prostate cancer   . Family history of throat cancer   . Hypertension   . Insomnia    meds needed daily to achieve 6+ hours sleep.    Surgical History: Past Surgical History:  Procedure Laterality Date  . ABLATION     uterine ablation  . APPENDECTOMY    . BIOPSY  12/18/2019   Procedure: BIOPSY;  Surgeon: Rogene Houston, MD;  [facility];  Service: Endoscopy;;  gastric  . BREATH TEK H PYLORI N/A 11/18/2014   Procedure: BREATH TEK H PYLORI;  Surgeon: Greer Pickerel, MD;  Location: [facility];  Service: General;  Laterality: N/A;  . CHOLECYSTECTOMY    . COLONOSCOPY N/A 12/18/2019   Procedure: COLONOSCOPY;  Surgeon: Rogene Houston, MD;  [facility];  Service: Endoscopy;  Laterality: N/A;  930  . ESOPHAGOGASTRODUODENOSCOPY N/A 10/01/2018   Procedure: ESOPHAGOGASTRODUODENOSCOPY (EGD);  Surgeon: Rogene Houston, MD;  [facility];  Service: Endoscopy;  Laterality: N/A;  . ESOPHAGOGASTRODUODENOSCOPY N/A 12/18/2019   Procedure: ESOPHAGOGASTRODUODENOSCOPY (EGD);  Surgeon: Rogene Houston, MD;  [facility];  Service: Endoscopy;  Laterality: N/A;  . LAPAROSCOPIC  ROUX-EN-Y GASTRIC BYPASS WITH HIATAL HERNIA REPAIR N/A 02/10/2015   Procedure: LAPAROSCOPIC ROUX-EN-Y GASTRIC BYPASS ;  Surgeon: Greer Pickerel, MD;  [facility];  Service: General;  Laterality: N/A;  . TUBAL LIGATION    . UPPER GI ENDOSCOPY N/A 02/10/2015   Procedure: UPPER GI ENDOSCOPY;  Surgeon: Greer Pickerel, MD;  [facility];  Service: General;  Laterality: N/A;    Home Medications:  Allergies as of 05/07/2020      Reactions   Lisinopril Cough   Aspirin Hives   Goody Powder-   Triamterene Other (See Comments)   Cramps   Other Hives      Medication List       Accurate as of May 07, 2020 12:51 PM. If you have any questions, ask your nurse or doctor.        acyclovir 400 MG tablet Commonly known as: ZOVIRAX Take 1 tablet (400 mg total) by mouth as needed.   [employer] by mouth daily.   Bactrim DS 800-160 MG tablet Generic drug: sulfamethoxazole-trimethoprim Take 1 tablet by mouth 2 (two) times daily.   CITRACAL PO Take 500 mg by mouth 3 (three) times daily.   cyclobenzaprine 10 MG tablet Commonly known as: FLEXERIL Take 1 tablet (10 mg total) by mouth at bedtime. What changed:   when to take this  reasons to take this   diphenhydrAMINE 50 MG tablet Commonly known as:  BENADRYL Take 50 mg by mouth at bedtime as needed for itching.   ergocalciferol 1.25 MG (50000 UT) capsule Commonly known as: VITAMIN D2 Take 1 capsule (50,000 Units total) by mouth once a week. One capsule once weekly   fluconazole 150 MG tablet Commonly known as: DIFLUCAN Take one tablet by mouth once weekly , as needed, for vaginal itch associated with yeats infection   hydrOXYzine 50 MG tablet Commonly known as: ATARAX/VISTARIL TAKE 3 TABLETS BY MOUTH AT BEDTIME What changed: how much to take   multivitamin tablet Take 1 tablet by mouth daily.   olmesartan-hydrochlorothiazide 20-12.5 MG tablet Commonly known as: BENICAR HCT Take 1 tablet by mouth daily.     ondansetron 4 MG disintegrating tablet Commonly known as: Zofran ODT Take 1 tablet (4 mg total) by mouth every 8 (eight) hours as needed for nausea or vomiting.   oxyCODONE-acetaminophen 5-325 MG tablet Commonly known as: PERCOCET/ROXICET Take 1 tablet by mouth every 6 (six) hours as needed for severe pain.   pantoprazole 40 MG tablet Commonly known as: PROTONIX Take 1 tablet (40 mg total) by mouth daily before breakfast.   phentermine 37.5 MG tablet Commonly known as: ADIPEX-P Take one half tablet by mouth every morning at breakfast   polycarbophil 625 MG tablet Commonly known as: FIBERCON Take 1,250 mg by mouth daily.   tamsulosin 0.4 MG Caps capsule Commonly known as: FLOMAX Take 1 capsule (0.4 mg total) by mouth daily after supper for 7 days.   traZODone 50 MG tablet Commonly known as: DESYREL TAKE 1 TABLET BY MOUTH AT BEDTIME   Xhance 93 MCG/ACT Exhu Generic drug: Fluticasone Propionate Place 1 puff into the nose in the morning and at bedtime.       Allergies:  Allergies  Allergen Reactions  . Lisinopril Cough  . Aspirin Hives    Goody Powder-  . Triamterene Other (See Comments)    Cramps   . Other Hives    Family History: Family History  Problem Relation Age of Onset  . Hypertension Mother   . Diabetes Mother   . Kidney disease Mother   . Hyperlipidemia Mother   . Colon cancer Mother 51       highly suspious of colon mass  . Breast cancer Mother 38  . Kidney disease Father   . Stroke Father   . Heart disease Father   . Hyperlipidemia Father   . Heart attack Father   . Diverticulitis Father   . Hypertension Brother   . Diabetes Brother   . Healthy Sister   . Other Daughter        hypertension  . Kidney disease Daughter   . Blindness Daughter   . Healthy Daughter   . Healthy Son   . Cancer Maternal Aunt 66       gallbladder cancer  . Breast cancer Paternal Aunt        dx. in her 18s  . Cervical cancer Maternal Grandmother        dx.  in her 70s  . Prostate cancer Maternal Grandfather        metastatic  . Heart attack Paternal Grandmother   . Hypertension Paternal Grandmother   . Stroke Paternal Grandmother   . Lung cancer Paternal Grandfather   . Throat cancer Paternal Grandfather   . Breast cancer Paternal Aunt        dx. in her 35s  . Breast cancer Paternal Aunt        dx. in  her 60s  . Throat cancer Paternal Uncle   . Lung cancer Paternal Uncle   . Breast cancer Other        dx. 44s or 60s (mother's first cousins x3)  . SIDS Maternal Aunt   . Prostate cancer Maternal Uncle        dx. in his 8s, not metastatic  . Prostate cancer Maternal Uncle        dx. in his 38s, not metastatic  . Breast cancer Cousin        dx. late 34s (paternal cousin)  . Breast cancer Other        dx. 40s/early 4s (maternal second cousins x2)    Social History:  reports that she quit smoking about 13 years ago. She has a 0.75 pack-year smoking history. She has never used smokeless tobacco. She reports current alcohol use. She reports that she does not use drugs.  ROS: All other review of systems were reviewed and are negative except what is noted above in HPI  Physical Exam: BP 132/85   Pulse (!) 106   Temp (!) 100.6 F (38.1 C)   Ht 5\' 8"  (1.727 m)   Wt 235 lb (106.6 kg)   BMI 35.73 kg/m   Constitutional:  Alert and oriented, No acute distress. HEENT:  AT, moist mucus membranes.  Trachea midline, no masses. Cardiovascular: No clubbing, cyanosis, or edema. Respiratory: Normal respiratory effort, no increased work of breathing. GI: Abdomen is soft, nontender, nondistended, no abdominal masses GU: No CVA tenderness.  Lymph: No cervical or inguinal lymphadenopathy. Skin: No rashes, bruises or suspicious lesions. Neurologic: Grossly intact, no focal deficits, moving all 4 extremities. Psychiatric: Normal mood and affect.  Laboratory Data: Lab Results  Component Value Date   WBC 7.0 05/05/2020   HGB 13.5 05/05/2020    HCT 41.3 05/05/2020   MCV 88.8 05/05/2020   PLT 383 05/05/2020    Lab Results  Component Value Date   CREATININE 1.17 (H) 05/05/2020    No results found for: PSA  No results found for: TESTOSTERONE  Lab Results  Component Value Date   HGBA1C 5.6 03/23/2015    Urinalysis    Component Value Date/Time   COLORURINE YELLOW 05/05/2020 1401   APPEARANCEUR HAZY (A) 05/05/2020 1401   LABSPEC 1.028 05/05/2020 1401   PHURINE 5.0 05/05/2020 1401   GLUCOSEU NEGATIVE 05/05/2020 1401   HGBUR NEGATIVE 05/05/2020 1401   [place] 05/05/2020 1401   BILIRUBINUR negative 01/10/2019 1026   BILIRUBINUR neg 12/15/2015 1355   KETONESUR 5 (A) 05/05/2020 1401   PROTEINUR NEGATIVE 05/05/2020 1401   UROBILINOGEN 0.2 01/10/2019 1026   UROBILINOGEN 0.2 08/29/2012 2335   NITRITE NEGATIVE 05/05/2020 1401   LEUKOCYTESUR NEGATIVE 05/05/2020 1401    No results found for: LABMICR, WBCUA, RBCUA, LABEPIT, MUCUS, BACTERIA  Pertinent Imaging: KUB today: Images reviewed and dicussed with the patient No results found for this or any previous visit.  No results found for this or any previous visit.  No results found for this or any previous visit.  No results found for this or any previous visit.  No results found for this or any previous visit.  No results found for this or any previous visit.  No results found for this or any previous visit.  No results found for this or any previous visit.   Assessment & Plan:    1. Nephrolithiasis -We discussed the management of kidney stones. These options include observation, ureteroscopy, shockwave lithotripsy (ESWL) and percutaneous  nephrolithotomy (PCNL). We discussed which options are relevant to the patient's stone(s). We discussed the natural history of kidney stones as well as the complications of untreated stones and the impact on quality of life without treatment as well as with each of the above listed treatments. We also discussed  the efficacy of each treatment in its ability to clear the stone burden. With any of these management options I discussed the signs and symptoms of infection and the need for emergent treatment should these be experienced. For each option we discussed the ability of each procedure to clear the patient of their stone burden.   For observation I described the risks which include but are not limited to silent renal damage, life-threatening infection, need for emergent surgery, failure to pass stone and pain.   For ureteroscopy I described the risks which include bleeding, infection, damage to contiguous structures, positioning injury, ureteral stricture, ureteral avulsion, ureteral injury, need for prolonged ureteral stent, inability to perform ureteroscopy, need for an interval procedure, inability to clear stone burden, stent discomfort/pain, heart attack, stroke, pulmonary embolus and the inherent risks with general anesthesia.   For shockwave lithotripsy I described the risks which include arrhythmia, kidney contusion, kidney hemorrhage, need for transfusion, pain, inability to adequately break up stone, inability to pass stone fragments, Steinstrasse, infection associated with obstructing stones, need for alternate surgical procedure, need for repeat shockwave lithotripsy, MI, CVA, PE and the inherent risks with anesthesia/conscious sedation.   For PCNL I described the risks including positioning injury, pneumothorax, hydrothorax, need for chest tube, inability to clear stone burden, renal laceration, arterial venous fistula or malformation, need for embolization of kidney, loss of kidney or renal function, need for repeat procedure, need for prolonged nephrostomy tube, ureteral avulsion, MI, CVA, PE and the inherent risks of general anesthesia.   - The patient would like to proceed with right ureteral stent placement  No follow-ups on file.  Nicolette Bang, MD  [facility] [place]

## 2020-05-07 NOTE — Anesthesia Postprocedure Evaluation (Signed)
Anesthesia Post Note  Patient: Kristine Lawson  Procedure(s) Performed: CYSTOSCOPY WITH RIGHT URETERAL STENT REPLACEMENT (Right Ureter) CYSTOSCOPY WITH RIGHT RETROGRADE PYELOGRAM (Right Ureter)  Patient location during evaluation: PACU Anesthesia Type: General Level of consciousness: awake and alert and oriented Pain management: pain level controlled Vital Signs Assessment: post-procedure vital signs reviewed and stable Respiratory status: spontaneous breathing and respiratory function stable Cardiovascular status: blood pressure returned to baseline and stable Postop Assessment: no apparent nausea or vomiting Anesthetic complications: no   No complications documented.   Last Vitals:  Vitals:   05/07/20 1550 05/07/20 1600  BP: 126/72 127/71  Pulse: 88 84  Resp: (!) 27 (!) 24  Temp: 37.7 C   SpO2: 100% 94%    Last Pain:  Vitals:   05/07/20 1600  TempSrc:   PainSc: 0-No pain                 [name] [name]

## 2020-05-07 NOTE — Progress Notes (Signed)
Urological Symptom Review  Patient is experiencing the following symptoms: None   Review of Systems  Gastrointestinal (upper)  : Nausea Vomiting  Gastrointestinal (lower) : Negative for lower GI symptoms  Constitutional : Fever Fatigue  Skin: Negative for skin symptoms  Eyes: Negative for eye symptoms  Ear/Nose/Throat : Negative for Ear/Nose/Throat symptoms  Hematologic/Lymphatic: Negative for Hematologic/Lymphatic symptoms  Cardiovascular : Negative for cardiovascular symptoms  Respiratory : Negative for respiratory symptoms  Endocrine: Negative for endocrine symptoms  Musculoskeletal: Joint pain  Neurological: Negative for neurological symptoms  Psychologic: Negative for psychiatric symptoms

## 2020-05-08 ENCOUNTER — Other Ambulatory Visit: Payer: Self-pay

## 2020-05-08 ENCOUNTER — Other Ambulatory Visit: Payer: Self-pay | Admitting: Urology

## 2020-05-08 ENCOUNTER — Encounter: Payer: Self-pay | Admitting: Family Medicine

## 2020-05-08 DIAGNOSIS — Z79899 Other long term (current) drug therapy: Secondary | ICD-10-CM | POA: Diagnosis not present

## 2020-05-08 DIAGNOSIS — N200 Calculus of kidney: Secondary | ICD-10-CM

## 2020-05-08 DIAGNOSIS — I10 Essential (primary) hypertension: Secondary | ICD-10-CM | POA: Diagnosis not present

## 2020-05-08 DIAGNOSIS — Z20822 Contact with and (suspected) exposure to covid-19: Secondary | ICD-10-CM | POA: Diagnosis not present

## 2020-05-08 DIAGNOSIS — N201 Calculus of ureter: Secondary | ICD-10-CM | POA: Diagnosis not present

## 2020-05-08 DIAGNOSIS — Z87891 Personal history of nicotine dependence: Secondary | ICD-10-CM | POA: Diagnosis not present

## 2020-05-08 LAB — COMPLETE METABOLIC PANEL WITH GFR
AG Ratio: 1.2 (calc) (ref 1.0–2.5)
ALT: 110 U/L — ABNORMAL HIGH (ref 6–29)
AST: 77 U/L — ABNORMAL HIGH (ref 10–35)
Albumin: 4 g/dL (ref 3.6–5.1)
Alkaline phosphatase (APISO): 82 U/L (ref 31–125)
BUN: 8 mg/dL (ref 7–25)
CO2: 26 mmol/L (ref 20–32)
Calcium: 9 mg/dL (ref 8.6–10.2)
Chloride: 100 mmol/L (ref 98–110)
Creat: 0.87 mg/dL (ref 0.50–1.10)
GFR, Est African American: 91 mL/min/{1.73_m2} (ref >=60)
GFR, Est Non African American: 78 mL/min/{1.73_m2} (ref >=60)
Globulin: 3.3 g/dL (calc) (ref 1.9–3.7)
Glucose, Bld: 87 mg/dL (ref 65–99)
Potassium: 4.1 mmol/L (ref 3.5–5.3)
Sodium: 138 mmol/L (ref 135–146)
Total Bilirubin: 1.6 mg/dL — ABNORMAL HIGH (ref 0.2–1.2)
Total Protein: 7.3 g/dL (ref 6.1–8.1)

## 2020-05-08 LAB — CBC
HCT: 38.3 % (ref 35.0–45.0)
Hemoglobin: 13.1 g/dL (ref 11.7–15.5)
MCH: 29.5 pg (ref 27.0–33.0)
MCHC: 34.2 g/dL (ref 32.0–36.0)
MCV: 86.3 fL (ref 80.0–100.0)
MPV: 9.2 fL (ref 7.5–12.5)
Platelets: 339 10*3/uL (ref 140–400)
RBC: 4.44 10*6/uL (ref 3.80–5.10)
RDW: 12.3 % (ref 11.0–15.0)
WBC: 15.5 10*3/uL — ABNORMAL HIGH (ref 3.8–10.8)

## 2020-05-08 LAB — AMYLASE: Amylase: 48 U/L (ref 21–101)

## 2020-05-08 LAB — LIPASE: Lipase: 70 U/L — ABNORMAL HIGH (ref 7–60)

## 2020-05-08 MED ORDER — OXYCODONE-ACETAMINOPHEN 5-325 MG PO TABS: 1.0000 | ORAL_TABLET | ORAL | 0 refills | Status: DC | PRN

## 2020-05-08 MED ORDER — CEFPODOXIME PROXETIL 200 MG PO TABS: 200.0000 mg | ORAL_TABLET | Freq: Two times a day (BID) | ORAL | 0 refills | Status: DC

## 2020-05-08 MED ORDER — CEFPODOXIME PROXETIL 200 MG PO TABS
200.0000 mg | ORAL_TABLET | Freq: Two times a day (BID) | ORAL | 0 refills | Status: DC
Start: 2020-05-08 — End: 2020-05-18

## 2020-05-08 MED ORDER — CHLORHEXIDINE GLUCONATE CLOTH 2 % EX PADS: 6.0000 | MEDICATED_PAD | Freq: Every day | CUTANEOUS | Status: DC

## 2020-05-08 NOTE — Discharge Instructions (Signed)

## 2020-05-08 NOTE — Addendum Note (Signed)
Addended by: Cleon Gustin on: 05/08/2020 12:55 PM   Modules accepted: Orders

## 2020-05-09 ENCOUNTER — Encounter: Payer: Self-pay | Admitting: Family Medicine

## 2020-05-09 DIAGNOSIS — R509 Fever, unspecified: Secondary | ICD-10-CM | POA: Insufficient documentation

## 2020-05-09 DIAGNOSIS — N132 Hydronephrosis with renal and ureteral calculous obstruction: Secondary | ICD-10-CM | POA: Insufficient documentation

## 2020-05-09 HISTORY — DX: Hydronephrosis with renal and ureteral calculous obstruction: N13.2

## 2020-05-09 LAB — URINE CULTURE

## 2020-05-09 NOTE — Assessment & Plan Note (Signed)
Low grade fever, No ut in the ED, unclear source, elevated lipase with abdominal tenderness , repeat labs and obtain Abdominal X ray

## 2020-05-09 NOTE — Progress Notes (Signed)
   Kristine Lawson     MRN: [id number]      DOB: 1971-06-21   HPI Kristine Lawson is here with a 4 day h/o right loin to groin pain, seen in the ED diagnosed with ureteral stone causing obstruction with hydronephrosis.was told to f/u with pCP C/o uncontrolled pain, weakness, states it hurts to breathe and has low grade fever C/o bloating, no BP, no flatus , scant belching  ROS  Denies sinus pressure, nasal congestion, ear pain or sore throat. Denies chest congestion, productive cough or wheezing. Denies chest pains, palpitations and leg swelling Denies  vomiting,diarrhea or constipation.  C/o RUQ pain Denies dysuria, frequency, hesitancy or incontinence. Denies hematuria PE  BP 120/74   Pulse 88   Temp (!) 100.6 F (38.1 C)   Resp 20   Ht 5\' 8"  (1.727 m)   Wt 235 lb (106.6 kg)   SpO2 95%   BMI 35.73 kg/m   Patient alert and oriented and in no cardiopulmonary distress Ill appearing and in pain  HEENT: No facial asymmetry, EOMI,     Neck supple .  Chest: Clear to auscultation bilaterally.  CVS: S1, S2 no murmurs, no S3.Regular rate.  ABD: Soft diffuse generalized tenderness most marked in RUQ,    Ext: No edema  MS: Adequate ROM spine, shoulders, hips and knees.  Skin: Intact, no ulcerations or rash noted.  Psych: Good eye contact, normal affect. Memory intact not anxious or depressed appearing.  CNS: CN 2-12 intact, power,  normal throughout.no focal deficits noted.   Assessment & Plan  Ureteral calculus Severe abdominal pain with right renal obstruction, urgent Urology consult  Ureteral stone with hydronephrosis Needs surgical intervention, appt with Urology arranged for same day  Fever Low grade fever, No ut in the ED, unclear source, elevated lipase with abdominal tenderness , repeat labs and obtain Abdominal X ray

## 2020-05-09 NOTE — Assessment & Plan Note (Signed)
Severe abdominal pain with right renal obstruction, urgent Urology consult

## 2020-05-09 NOTE — Assessment & Plan Note (Signed)
Needs surgical intervention, appt with Urology arranged for same day

## 2020-05-11 ENCOUNTER — Encounter (HOSPITAL_COMMUNITY)
Admit: 2020-05-11 | Discharge: 2020-05-11 | Disposition: A | Discharge status: Home / Self Care | Payer: 59 | Attending: Urology | Admitting: Urology

## 2020-05-11 ENCOUNTER — Encounter (HOSPITAL_COMMUNITY): Payer: Self-pay | Admitting: Urology

## 2020-05-11 ENCOUNTER — Other Ambulatory Visit: Payer: Self-pay

## 2020-05-11 ENCOUNTER — Telehealth: Payer: Self-pay

## 2020-05-12 ENCOUNTER — Encounter (HOSPITAL_COMMUNITY)
Admission: RE | Disposition: A | Discharge status: Home / Self Care | Payer: Self-pay | Source: Home / Self Care | Attending: Urology

## 2020-05-12 ENCOUNTER — Encounter (HOSPITAL_COMMUNITY): Payer: Self-pay | Admitting: Urology

## 2020-05-12 ENCOUNTER — Telehealth: Payer: Self-pay

## 2020-05-12 ENCOUNTER — Ambulatory Visit (HOSPITAL_COMMUNITY)
Admission: RE | Admit: 2020-05-12 | Discharge: 2020-05-12 | Disposition: A | Discharge status: Home / Self Care | Payer: 59 | Attending: Urology | Admitting: Urology

## 2020-05-12 ENCOUNTER — Ambulatory Visit (HOSPITAL_COMMUNITY)
Admission: RE | Admit: 2020-05-12 | Discharge: 2020-05-12 | Disposition: A | Discharge status: Home / Self Care | Payer: 59 | Source: Home / Self Care | Attending: Urology | Admitting: Urology

## 2020-05-12 DIAGNOSIS — Z960 Presence of urogenital implants: Secondary | ICD-10-CM | POA: Diagnosis not present

## 2020-05-12 DIAGNOSIS — Z803 Family history of malignant neoplasm of breast: Secondary | ICD-10-CM | POA: Diagnosis not present

## 2020-05-12 DIAGNOSIS — Z888 Allergy status to other drugs, medicaments and biological substances status: Secondary | ICD-10-CM | POA: Insufficient documentation

## 2020-05-12 DIAGNOSIS — N200 Calculus of kidney: Secondary | ICD-10-CM

## 2020-05-12 DIAGNOSIS — Z79899 Other long term (current) drug therapy: Secondary | ICD-10-CM | POA: Insufficient documentation

## 2020-05-12 DIAGNOSIS — Z466 Encounter for fitting and adjustment of urinary device: Secondary | ICD-10-CM | POA: Diagnosis not present

## 2020-05-12 DIAGNOSIS — I10 Essential (primary) hypertension: Secondary | ICD-10-CM | POA: Insufficient documentation

## 2020-05-12 DIAGNOSIS — Z87891 Personal history of nicotine dependence: Secondary | ICD-10-CM | POA: Insufficient documentation

## 2020-05-12 DIAGNOSIS — Z801 Family history of malignant neoplasm of trachea, bronchus and lung: Secondary | ICD-10-CM | POA: Diagnosis not present

## 2020-05-12 DIAGNOSIS — Z01818 Encounter for other preprocedural examination: Secondary | ICD-10-CM | POA: Diagnosis not present

## 2020-05-12 DIAGNOSIS — Z886 Allergy status to analgesic agent status: Secondary | ICD-10-CM | POA: Insufficient documentation

## 2020-05-12 DIAGNOSIS — Z800 Family history of malignant neoplasm of digestive organs: Secondary | ICD-10-CM | POA: Insufficient documentation

## 2020-05-12 DIAGNOSIS — N201 Calculus of ureter: Secondary | ICD-10-CM | POA: Insufficient documentation

## 2020-05-12 DIAGNOSIS — Z8049 Family history of malignant neoplasm of other genital organs: Secondary | ICD-10-CM | POA: Diagnosis not present

## 2020-05-12 DIAGNOSIS — Z8042 Family history of malignant neoplasm of prostate: Secondary | ICD-10-CM | POA: Insufficient documentation

## 2020-05-12 HISTORY — PX: EXTRACORPOREAL SHOCK WAVE LITHOTRIPSY: SHX1557

## 2020-05-12 SURGERY — LITHOTRIPSY, ESWL
Anesthesia: LOCAL | Laterality: Right

## 2020-05-12 MED ORDER — OXYCODONE-ACETAMINOPHEN 5-325 MG PO TABS: 1.0000 | ORAL_TABLET | ORAL | 0 refills | Status: DC | PRN

## 2020-05-12 MED ORDER — DIPHENHYDRAMINE HCL 25 MG PO CAPS
25.0000 mg | ORAL_CAPSULE | Freq: Once | ORAL | Status: AC
  Administered 2020-05-12: 25 mg via ORAL

## 2020-05-12 MED ORDER — ONDANSETRON 4 MG PO TBDP: 4.0000 mg | ORAL_TABLET | Freq: Three times a day (TID) | ORAL | 0 refills | Status: DC | PRN

## 2020-05-12 MED ORDER — DIAZEPAM 5 MG PO TABS
10.0000 mg | ORAL_TABLET | Freq: Once | ORAL | Status: AC
  Administered 2020-05-12: 10 mg via ORAL
  Filled 2020-05-12: qty 2

## 2020-05-12 MED ORDER — SODIUM CHLORIDE 0.9 % IV SOLN: INTRAVENOUS | Status: DC

## 2020-05-12 MED ORDER — DIPHENHYDRAMINE HCL 25 MG PO CAPS
ORAL_CAPSULE | ORAL | Status: AC
  Filled 2020-05-12: qty 1

## 2020-05-12 MED ORDER — TAMSULOSIN HCL 0.4 MG PO CAPS: 0.4000 mg | ORAL_CAPSULE | Freq: Every day | ORAL | 0 refills | Status: DC

## 2020-05-12 MED ORDER — DIPHENHYDRAMINE HCL 25 MG PO CAPS
25.0000 mg | ORAL_CAPSULE | ORAL | Status: AC
  Administered 2020-05-12: 25 mg via ORAL
  Filled 2020-05-12: qty 1

## 2020-05-12 NOTE — Interval H&P Note (Signed)
History and Physical Interval Note:  05/12/2020 8:30 AM  Kristine Lawson  has presented today for surgery, with the diagnosis of right ureteral calculus.  The various methods of treatment have been discussed with the patient and family. After consideration of risks, benefits and other options for treatment, the patient has consented to  Procedure(s): EXTRACORPOREAL SHOCK WAVE LITHOTRIPSY (ESWL) (Right) as a surgical intervention.  The patient's history has been reviewed, patient examined, no change in status, stable for surgery.  I have reviewed the patient's chart and labs.  Questions were answered to the patient's satisfaction.     Nicolette Bang

## 2020-05-12 NOTE — Discharge Instructions (Signed)
Laser Therapy for Kidney Stones Laser therapy for kidney stones is a procedure to break up small, hard mineral deposits that form in the kidney (kidney stones). The procedure is done using a device that produces a focused beam of light (laser). The laser breaks up kidney stones into pieces that are small enough to be passed out of the body through urination or removed from the body during the procedure. You may need laser therapy if you have kidney stones that are painful or block your urinary tract. This procedure is done by inserting a tube (ureteroscope) into your kidney through the urethral opening. The urethra is the part of the body that drains urine from the bladder. In women, the urethra opens above the vaginal opening. In men, the urethra opens at the tip of the penis. The ureteroscope is inserted through the urethra, and surgical instruments are moved through the bladder and the muscular tube that connects the kidney to the bladder (ureter) until they reach the kidney. Tell a health care provider about:  Any allergies you have.  All medicines you are taking, including vitamins, herbs, eye drops, creams, and over-the-counter medicines.  Any problems you or family members have had with anesthetic medicines.  Any blood disorders you have.  Any surgeries you have had.  Any medical conditions you have.  Whether you are pregnant or may be pregnant. What are the risks? Generally, this is a safe procedure. However, problems may occur, including:  Infection.  Bleeding.  Allergic reactions to medicines.  Damage to the urethra, bladder, or ureter.  Urinary tract infection (UTI).  Narrowing of the urethra (urethral stricture).  Difficulty passing urine.  Blockage of the kidney caused by a fragment of kidney stone. What happens before the procedure? Medicines  Ask your health care provider about: ? Changing or stopping your regular medicines. This is especially important if you  are taking diabetes medicines or blood thinners. ? Taking medicines such as aspirin and ibuprofen. These medicines can thin your blood. Do not take these medicines unless your health care provider tells you to take them. ? Taking over-the-counter medicines, vitamins, herbs, and supplements. Eating and drinking Follow instructions from your health care provider about eating and drinking, which may include:  8 hours before the procedure - stop eating heavy meals or foods, such as meat, fried foods, or fatty foods.  6 hours before the procedure - stop eating light meals or foods, such as toast or cereal.  6 hours before the procedure - stop drinking milk or drinks that contain milk.  2 hours before the procedure - stop drinking clear liquids. Staying hydrated Follow instructions from your health care provider about hydration, which may include:  Up to 2 hours before the procedure - you may continue to drink clear liquids, such as water, clear fruit juice, black coffee, and plain tea.  General instructions  You may have a physical exam before the procedure. You may also have tests, such as imaging tests and blood or urine tests.  If your ureter is too narrow, your health care provider may place a soft, flexible tube (stent) inside of it. The stent may be placed days or weeks before your laser therapy procedure.  Plan to have someone take you home from the hospital or clinic.  If you will be going home right after the procedure, plan to have someone stay with you for 24 hours.  Do not use any products that contain nicotine or tobacco for at least 4  weeks before the procedure. These products include cigarettes, e-cigarettes, and chewing tobacco. If you need help quitting, ask your health care provider.  Ask your health care provider: ? How your surgical site will be marked or identified. ? What steps will be taken to help prevent infection. These may include:  Removing hair at the surgery  site.  Washing skin with a germ-killing soap.  Taking antibiotic medicine. What happens during the procedure?   An IV will be inserted into one of your veins.  You will be given one or more of the following: ? A medicine to help you relax (sedative). ? A medicine to numb the area (local anesthetic). ? A medicine to make you fall asleep (general anesthetic).  A ureteroscope will be inserted into your urethra. The ureteroscope will send images to a video screen in the operating room to guide your surgeon to the area of your kidney that will be treated.  A small, flexible tube will be threaded through the ureteroscope and into your bladder and ureter, up to your kidney.  The laser device will be inserted into your kidney through the tube. Your surgeon will pulse the laser on and off to break up kidney stones.  A surgical instrument that has a tiny wire basket may be inserted through the tube into your kidney to remove the pieces of broken kidney stone. The procedure may vary among health care providers and hospitals. What happens after the procedure?  Your blood pressure, heart rate, breathing rate, and blood oxygen level will be monitored until you leave the hospital or clinic.  You will be given pain medicine as needed.  You may continue to receive antibiotics.  You may have a stent temporarily placed in your ureter.  Do not drive for 24 hours if you were given a sedative during your procedure.  You may be given a strainer to collect any stone fragments that you pass in your urine. Your health care provider may have these tested. Summary  Laser therapy for kidney stones is a procedure to break up kidney stones into pieces that are small enough to be passed out of the body through urination or removed during the procedure.  Follow instructions from your health care provider about eating and drinking before the procedure.  During the procedure, the ureteroscope will send images  to a video screen to guide your surgeon to the area of your kidney that will be treated.  Do not drive for 24 hours if you were given a sedative during your procedure. This information is not intended to replace advice given to you by your health care provider. Make sure you discuss any questions you have with your health care provider. Document Revised: 03/22/2018 Document Reviewed: 03/22/2018 Elsevier Patient Education  2020 [employer].

## 2020-05-12 NOTE — Telephone Encounter (Signed)
Transition Care Management Unsuccessful Follow-up Telephone Call  Date of discharge and from where:  05/08/20  Attempts:   Reason for unsuccessful TCM follow-up call: patient is currently admitted

## 2020-05-12 NOTE — Progress Notes (Signed)
1054- Benadryl 25 mg given for excessive itching per Dr. Alyson Ingles order

## 2020-05-12 NOTE — Telephone Encounter (Signed)
Called pt back to make her aware that Dr. Alyson Ingles states that her stone is to big to pass on her own, she feels better due to the stent, and she needs to proceed with the lithotripsy.  Pt voiced understanding. (05/11/20)

## 2020-05-13 ENCOUNTER — Encounter (HOSPITAL_COMMUNITY): Payer: Self-pay | Admitting: Urology

## 2020-05-14 NOTE — Discharge Summary (Signed)
Physician Discharge Summary  Patient ID: Kristine Lawson MRN: [id number] DOB/AGE: [birth year] 49 y.o.  Admit date: 05/07/2020 Discharge date:  05/08/2020  Admission Diagnoses:  Ureteral calculus  Discharge Diagnoses:  Active Problems:   Ureteral calculus   Past Medical History:  Diagnosis Date   Allergy    Anemia    IDA   Arthritis    Arthritis    Family history of breast cancer    Family history of cancer of gallbladder    Family history of cervical cancer    Family history of lung cancer    Family history of prostate cancer    Family history of throat cancer    GERD (gastroesophageal reflux disease)    History of stomach ulcers    Hypertension    Insomnia    meds needed daily to achieve 6+ hours sleep.    Surgeries: Procedure(s): CYSTOSCOPY WITH RIGHT URETERAL STENT REPLACEMENT CYSTOSCOPY WITH RIGHT RETROGRADE PYELOGRAM on 05/07/2020   Consultants (if any):   Discharged Condition: Improved  Hospital Course: Kristine Lawson is an 49 y.o. female who was admitted 05/07/2020 with a diagnosis of right ureteral calculus and fever and went to the operating room on 05/07/2020 and underwent the above named procedures.    She was given perioperative antibiotics:  Anti-infectives (From admission, onward)   Start     Dose/Rate Route Frequency Ordered Stop   05/08/20 1500  cefTRIAXone (ROCEPHIN) 2 g in sodium chloride 0.9 % 100 mL IVPB  Status:  Discontinued        2 g 200 mL/hr over 30 Minutes Intravenous Every 24 hours 05/07/20 1626 05/08/20 1754   05/08/20 0000  cefpodoxime (VANTIN) 200 MG tablet  Status:  Discontinued        200 mg Oral 2 times daily 05/08/20 1105 05/08/20    05/07/20 1517  cefTRIAXone (ROCEPHIN) 2 g in sodium chloride 0.9 % 100 mL IVPB        2 g 200 mL/hr over 30 Minutes Intravenous 30 min pre-op 05/07/20 1517 05/07/20 1515    .  She was given sequential compression devices, early ambulation, for DVT prophylaxis.  She benefited  maximally from the hospital stay and there were no complications.    Recent vital signs:  Vitals:   05/08/20 0625 05/08/20 0911  BP: 133/78 122/82  Pulse: 79 74  Resp: 18 16  Temp: 98.6 F (37 C) 98.1 F (36.7 C)  SpO2: 97% 97%    Recent laboratory studies:  Lab Results  Component Value Date   HGB 11.7 (L) 05/07/2020   HGB 13.1 05/07/2020   HGB 13.5 05/05/2020   Lab Results  Component Value Date   WBC 13.3 (H) 05/07/2020   PLT 296 05/07/2020   No results found for: INR Lab Results  Component Value Date   NA 135 05/07/2020   K 3.6 05/07/2020   CL 100 05/07/2020   CO2 24 05/07/2020   BUN 8 05/07/2020   CREATININE 0.85 05/07/2020   GLUCOSE 95 05/07/2020    Discharge Medications:   Allergies as of 05/08/2020      Reactions   Lisinopril Cough   Aspirin Hives   Goody Powder-   Triamterene Other (See Comments)   Cramps      Medication List    STOP taking these medications   Bactrim DS 800-160 MG tablet Generic drug: sulfamethoxazole-trimethoprim   fluconazole 150 MG tablet Commonly known as: DIFLUCAN   phentermine 37.5 MG tablet Commonly known as: ADIPEX-P  TAKE these medications   acyclovir 400 MG tablet Commonly known as: ZOVIRAX Take 1 tablet (400 mg total) by mouth as needed. What changed: reasons to take this   Align Chew Chew 2 capsules by mouth daily.   CITRACAL PO Take 500 mg by mouth daily.   clobetasol 0.05 % external solution Commonly known as: TEMOVATE Apply 1 application topically 2 (two) times daily.   cyclobenzaprine 10 MG tablet Commonly known as: FLEXERIL Take 1 tablet (10 mg total) by mouth at bedtime. What changed:   when to take this  reasons to take this   diphenhydrAMINE 50 MG tablet Commonly known as: BENADRYL Take 50 mg by mouth daily.   ergocalciferol 1.25 MG (50000 UT) capsule Commonly known as: VITAMIN D2 Take 1 capsule (50,000 Units total) by mouth once a week. One capsule once weekly What changed:  additional instructions   hydrOXYzine 50 MG tablet Commonly known as: ATARAX/VISTARIL TAKE 3 TABLETS BY MOUTH AT BEDTIME What changed: how much to take   multivitamin tablet Take 1 tablet by mouth daily.   Myorisan 40 MG capsule Generic drug: ISOtretinoin Take 40 mg by mouth daily.   olmesartan-hydrochlorothiazide 20-12.5 MG tablet Commonly known as: BENICAR HCT Take 1 tablet by mouth daily.   pantoprazole 40 MG tablet Commonly known as: PROTONIX Take 1 tablet (40 mg total) by mouth daily before breakfast.   polycarbophil 625 MG tablet Commonly known as: FIBERCON Take 1,250 mg by mouth daily.   traZODone 50 MG tablet Commonly known as: DESYREL TAKE 1 TABLET BY MOUTH AT BEDTIME   Xhance 93 MCG/ACT Exhu Generic drug: Fluticasone Propionate Place 1 puff into the nose in the morning and at bedtime. What changed: when to take this       Diagnostic Studies: DG Abd 1 View  Result Date: 05/12/2020 CLINICAL DATA:  Recent stent placement, preop evaluation for upcoming lithotripsy EXAM: ABDOMEN - 1 VIEW COMPARISON:  CT from 05/05/2020 and plain film from 05/07/2020 FINDINGS: Scattered large and small bowel gas is noted. Right ureteral stent is noted in satisfactory position. The known ureteral stone is not well appreciated. It may be faintly calcified. IMPRESSION: Right ureteral stent in place. The known right ureteral stone is not well appreciated on this exam. Electronically Signed   By: Inez Catalina M.D.   On: 05/12/2020 15:27   Abdomen 1 view (KUB)  Result Date: 05/09/2020 CLINICAL DATA:  Nephrolithiasis EXAM: ABDOMEN - 1 VIEW COMPARISON:  05/05/2020 FINDINGS: Supine frontal views of the abdomen and pelvis are obtained. The obstructing distal right ureteral calculus seen on prior study is not visualized on this exam, either due to passage, removal, or obscuration by bowel gas/stool. Small phleboliths are seen within the pelvis, stable. Bowel gas pattern is unremarkable.  Cholecystectomy clips right upper quadrant. IMPRESSION: 1. Obstructing distal right ureteral calculus seen on previous CT is not visualized on this exam, either due to passage/removal or obscuration by bowel gas and stool. 2. Otherwise unremarkable exam. Electronically Signed   By: Randa Ngo M.D.   On: 05/09/2020 08:16   CT ABDOMEN PELVIS W CONTRAST  Result Date: 05/05/2020 CLINICAL DATA:  49 year old female with right lower quadrant abdominal pain. EXAM: CT ABDOMEN AND PELVIS WITH CONTRAST TECHNIQUE: Multidetector CT imaging of the abdomen and pelvis was performed using the standard protocol following bolus administration of intravenous contrast. CONTRAST:  124mL OMNIPAQUE IOHEXOL 300 MG/ML  SOLN COMPARISON:  None. FINDINGS: Lower chest: The visualized lung bases are clear. No intra-abdominal free air  or free fluid. Hepatobiliary: The liver is unremarkable. No intrahepatic biliary dilatation. Cholecystectomy. No retained calcified stone noted in the central CBD. Pancreas: Unremarkable. No pancreatic ductal dilatation or surrounding inflammatory changes. Spleen: Normal in size without focal abnormality. Adrenals/Urinary Tract: The adrenal glands unremarkable. There is mild right hydronephrosis. There is delayed enhancement of the right renal parenchyma with mild perinephric stranding. Correlation with urinalysis recommended to exclude superimposed UTI. There is a 6 mm stone in the distal right ureter. There is a punctate non-obstructing left renal inferior pole calculus. No hydronephrosis on the left. The left ureter and urinary bladder appear unremarkable. 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 There is colonic diverticulosis without active inflammatory changes. There is no bowel obstruction or active inflammation. Appendectomy. Vascular/Lymphatic: The abdominal aorta and IVC unremarkable. No portal venous gas. There is no adenopathy. Reproductive: Multiple uterine fibroids.  Other: None Musculoskeletal: No acute or significant osseous findings. IMPRESSION: 1. A 6 mm distal right ureteral stone with mild right hydronephrosis. Correlation with urinalysis recommended to exclude superimposed UTI. 2. Colonic diverticulosis. No bowel obstruction. 3. Uterine fibroids. Electronically Signed   By: Anner Crete M.D.   On: 05/05/2020 19:14   DG C-Arm 1-60 Min-No Report  Result Date: 05/07/2020 Fluoroscopy was utilized by the requesting physician.  No radiographic interpretation.    Disposition: Discharge disposition: 01-Home or Self Care          Follow-up Information    Kiasia Chou, Candee Furbish, MD. Schedule an appointment as soon as possible for a visit on 05/08/2020.   Specialty: Urology Why: 05/12/2020 for ESWL Contact information: [address]  [place] [id number] [phone]                Signed: Nicolette Bang 05/14/2020, 2:50 PM

## 2020-05-15 ENCOUNTER — Encounter: Payer: Self-pay | Admitting: Family Medicine

## 2020-05-15 ENCOUNTER — Other Ambulatory Visit: Payer: Self-pay | Admitting: Urology

## 2020-05-15 ENCOUNTER — Encounter: Payer: Self-pay | Admitting: Urology

## 2020-05-15 ENCOUNTER — Other Ambulatory Visit: Payer: Self-pay

## 2020-05-15 ENCOUNTER — Ambulatory Visit (INDEPENDENT_AMBULATORY_CARE_PROVIDER_SITE_OTHER): Payer: 59 | Admitting: Urology

## 2020-05-15 VITALS — BP 117/84 | HR 91 | Temp 98.3°F | Ht 68.0 in | Wt 235.0 lb

## 2020-05-15 DIAGNOSIS — N200 Calculus of kidney: Secondary | ICD-10-CM

## 2020-05-15 MED ORDER — CIPROFLOXACIN HCL 500 MG PO TABS
500.0000 mg | ORAL_TABLET | Freq: Once | ORAL | Status: AC
  Administered 2020-05-15: 500 mg via ORAL

## 2020-05-15 NOTE — Progress Notes (Signed)
Urological Symptom Review  Patient is experiencing the following symptoms: None    Review of Systems  Gastrointestinal (upper)  : Negative for upper GI symptoms  Gastrointestinal (lower) : Negative for lower GI symptoms  Constitutional : Negative for symptoms  Skin: Negative for skin symptoms  Eyes: Negative for eye symptoms  Ear/Nose/Throat : Negative for Ear/Nose/Throat symptoms  Hematologic/Lymphatic: Negative for Hematologic/Lymphatic symptoms  Cardiovascular : Negative for cardiovascular symptoms  Respiratory : Negative for respiratory symptoms  Endocrine: Negative for endocrine symptoms  Musculoskeletal: Joint pain  Neurological: Negative for neurological symptoms  Psychologic: Negative for psychiatric symptoms

## 2020-05-15 NOTE — Patient Instructions (Signed)

## 2020-05-15 NOTE — Progress Notes (Signed)
   05/15/20  CC: followup nephrolithiasis  HPI: Ms Schram is a 49yo here for stent removal after ESWL Blood pressure 117/84, pulse 91, temperature 98.3 F (36.8 C), height 5\' 8"  (1.727 m), weight 235 lb (106.6 kg). NED. A&Ox3.   No respiratory distress   Abd soft, NT, ND Normal external genitalia with patent urethral meatus  Cystoscopy Procedure Note  Patient identification was confirmed, informed consent was obtained, and patient was prepped using Betadine solution.  Lidocaine jelly was administered per urethral meatus.    Procedure: - Flexible cystoscope introduced, without any difficulty.   - Thorough search of the bladder revealed:    normal urethral meatus    normal urothelium    no stones    no ulcers     no tumors    no urethral polyps    no trabeculation  - Ureteral orifices were normal in position and appearance.  Using a grasper the right ureteral stent was removed intact.  Post-Procedure: - Patient tolerated the procedure well  Assessment/ Plan:  RTC 2 weeks with KUB  No follow-ups on file.  Nicolette Bang, MD

## 2020-05-18 ENCOUNTER — Encounter: Payer: Self-pay | Admitting: Family Medicine

## 2020-05-18 ENCOUNTER — Other Ambulatory Visit: Payer: Self-pay

## 2020-05-18 ENCOUNTER — Telehealth: Payer: 59 | Admitting: Family Medicine

## 2020-05-18 ENCOUNTER — Encounter: Payer: Self-pay | Admitting: Urology

## 2020-05-18 VITALS — BP 126/77 | Ht 68.0 in | Wt 225.0 lb

## 2020-05-18 DIAGNOSIS — I10 Essential (primary) hypertension: Secondary | ICD-10-CM

## 2020-05-18 DIAGNOSIS — Z9884 Bariatric surgery status: Secondary | ICD-10-CM | POA: Diagnosis not present

## 2020-05-18 DIAGNOSIS — Z09 Encounter for follow-up examination after completed treatment for conditions other than malignant neoplasm: Secondary | ICD-10-CM

## 2020-05-18 DIAGNOSIS — N200 Calculus of kidney: Secondary | ICD-10-CM

## 2020-05-18 LAB — URINALYSIS, ROUTINE W REFLEX MICROSCOPIC
Bilirubin, UA: NEGATIVE
Glucose, UA: NEGATIVE
Nitrite, UA: NEGATIVE
Specific Gravity, UA: 1.025 (ref 1.005–1.030)
Urobilinogen, Ur: 0.2 mg/dL (ref 0.2–1.0)
pH, UA: 5.5 (ref 5.0–7.5)

## 2020-05-18 LAB — URINE CULTURE: Organism ID, Bacteria: NO GROWTH

## 2020-05-18 LAB — MICROSCOPIC EXAMINATION
RBC, Urine: >30 /hpf — AB (ref 0–2)
Renal Epithel, UA: NONE SEEN /hpf

## 2020-05-18 MED ORDER — TAMSULOSIN HCL 0.4 MG PO CAPS: 0.4000 mg | ORAL_CAPSULE | Freq: Every day | ORAL | 0 refills | Status: DC

## 2020-05-18 MED FILL — TAMSULOSIN HCL 0.4 MG CAP: 0.4 | 30 days supply | Qty: 30 | Fill #0

## 2020-05-18 NOTE — Progress Notes (Signed)
Virtual Visit via Telephone Note  I connected with Kristine Lawson on 05/18/20 at  8:20 AM EDT by telephone and verified that I am speaking with the correct person using two identifiers.  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 I also discussed with the patient that there may be a patient responsible charge related to this service. The patient expressed understanding and agreed to proceed.   History of Present Illness: F/U for recent hospitalization for admission on 10/14 to 05/08/2020 for right  ureteral calculus, had stent placed then returned on 10/19 and  had  extracorporal shockwave lithotripsy on 10/19, states feel 100 % improved, no flank pain , no hematuria , fever or chills. During initial presentation , liver enzymes were elevated ,s o needs rept testing as was her CBC   Observations/Objective: Good communication with no confusion and intact memory. Alert and oriented x 3 No signs of respiratory distress during speech    Assessment and Plan: Hospital discharge follow-up fullrecovery following, stent placement , then  extracorporal right lithotripsy, for ureteral stone causing mild right hydronephrosis. Will follow up with urology as deemed necessary Repeat labs needed as abnormal at original presentation and will be done in 1 week Hospital course, reviewd  With patient and all questions answered  HYPERTENSION, BENIGN ESSENTIAL Controlled, no change in medication DASH diet and commitment to daily physical activity for a minimum of 30 minutes discussed and encouraged, as a part of hypertension management. The importance of attaining a healthy weight is also discussed.  BP/Weight 05/18/2020 05/15/2020 05/12/2020 05/08/2020 05/07/2020 05/07/2020 24/58/0998  Systolic BP 338 250 539 767 - 341 -   Diastolic BP 77 84 78 82 - 85 -  Wt. (Lbs) 225 235 234.79 234.79 - 235 -  BMI 34.21 35.73 35.7 - 35.7 - 35.73         Follow Up Instructions:    I discussed the assessment and treatment plan with the patient. The patient was provided an opportunity to ask questions and all were answered. The patient agreed with the plan and demonstrated an understanding of the instructions.   The patient was advised to call back or seek an in-person evaluation if the symptoms worsen or if the condition fails to improve as anticipated.  I provided 10 minutes of non-face-to-face time during this encounter.   Tula Nakayama, MD

## 2020-05-18 NOTE — Patient Instructions (Signed)
F/U in office with mD in 5 months, call if you need me sooner  Thankful you are much improved  Please get at [facility] non fasting CBC , hepatic panel and hepatitis panel on Nov 1,2021 or some time in that week  It is important that you exercise regularly at least 30 minutes 5 times a week. If you develop chest pain, have severe difficulty breathing, or feel very tired, stop exercising immediately and seek medical attention  Think about what you will eat, plan ahead. Choose " clean, green, fresh or frozen" over canned, processed or packaged foods which are more sugary, salty and fatty. 70 to 75% of food eaten should be vegetables and fruit. Three meals at set times with snacks allowed between meals, but they must be fruit or vegetables. Aim to eat over a 12 hour period , example 7 am to 7 pm, and STOP after  your last meal of the day. Drink water,generally about 64 ounces per day, no other drink is as healthy. Fruit juice is best enjoyed in a healthy way, by EATING the fruit. Thanks for choosing [facility], we consider it a privelige to serve you.

## 2020-05-24 DIAGNOSIS — Z09 Encounter for follow-up examination after completed treatment for conditions other than malignant neoplasm: Secondary | ICD-10-CM | POA: Insufficient documentation

## 2020-05-24 NOTE — Assessment & Plan Note (Signed)
Controlled, no change in medication DASH diet and commitment to daily physical activity for a minimum of 30 minutes discussed and encouraged, as a part of hypertension management. The importance of attaining a healthy weight is also discussed.  BP/Weight 05/18/2020 05/15/2020 05/12/2020 05/08/2020 05/07/2020 05/07/2020 81/04/3158  Systolic BP 458 592 924 462 - 863 -  Diastolic BP 77 84 78 82 - 85 -  Wt. (Lbs) 225 235 234.79 234.79 - 235 -  BMI 34.21 35.73 35.7 - 35.7 - 35.73

## 2020-05-24 NOTE — Assessment & Plan Note (Addendum)
fullrecovery following, stent placement , then  extracorporal right lithotripsy, for ureteral stone causing mild right hydronephrosis. Will follow up with urology as deemed necessary Repeat labs needed as abnormal at original presentation and will be done in 1 week Hospital course, reviewd  With patient and all questions answered

## 2020-05-25 ENCOUNTER — Ambulatory Visit: Payer: 59 | Admitting: Urology

## 2020-05-26 ENCOUNTER — Ambulatory Visit: Payer: 59 | Admitting: Urology

## 2020-05-28 ENCOUNTER — Encounter: Payer: Self-pay | Admitting: Urology

## 2020-05-29 ENCOUNTER — Ambulatory Visit (HOSPITAL_COMMUNITY)
Admission: RE | Admit: 2020-05-29 | Discharge: 2020-05-29 | Disposition: A | Discharge status: Home / Self Care | Payer: 59 | Source: Ambulatory Visit | Attending: Urology | Admitting: Urology

## 2020-05-29 ENCOUNTER — Other Ambulatory Visit: Payer: Self-pay

## 2020-05-29 DIAGNOSIS — Z87442 Personal history of urinary calculi: Secondary | ICD-10-CM | POA: Diagnosis not present

## 2020-05-29 DIAGNOSIS — N200 Calculus of kidney: Secondary | ICD-10-CM | POA: Insufficient documentation

## 2020-06-01 ENCOUNTER — Encounter: Payer: Self-pay | Admitting: Urology

## 2020-06-01 ENCOUNTER — Telehealth (INDEPENDENT_AMBULATORY_CARE_PROVIDER_SITE_OTHER): Payer: 59 | Admitting: Urology

## 2020-06-01 DIAGNOSIS — N200 Calculus of kidney: Secondary | ICD-10-CM

## 2020-06-01 NOTE — Progress Notes (Signed)
06/01/2020 4:31 PM   Kristine Lawson [birth year] [id number]  Referring provider: Fayrene Helper, MD [address], [place],  [place] [address]   Patient location: Home  Physician location: Office  nephrolithiasis  HPI: Kristine Lawson is a 49yo seen today for nephrolithiasis via telehealth. She denies any new flank pain. No hematuria or dysuria. No worsening LUTS. She has not seen any more fragments pass since last visit. She feels well. KUB from today shows no residual calculi   I connected with  [name] on 06/01/20 by a video enabled telemedicine application and verified that I am speaking with the correct person using two identifiers.   I discussed the limitations of evaluation and management by telemedicine. The patient expressed understanding and agreed to proceed.    PMH: Past Medical History:  Diagnosis Date   Allergy    Anemia    IDA   Arthritis    Arthritis    Family history of breast cancer    Family history of cancer of gallbladder    Family history of cervical cancer    Family history of lung cancer    Family history of prostate cancer    Family history of throat cancer    GERD (gastroesophageal reflux disease)    History of stomach ulcers    Hypertension    Insomnia    meds needed daily to achieve 6+ hours sleep.    Surgical History: Past Surgical History:  Procedure Laterality Date   ABLATION     uterine ablation   APPENDECTOMY     BIOPSY  12/18/2019   Procedure: BIOPSY;  Surgeon: Rogene Houston, MD;  [facility];  Service: Endoscopy;;  gastric   BREATH TEK H PYLORI N/A 11/18/2014   Procedure: BREATH TEK Kandis Ban;  Surgeon: Greer Pickerel, MD;  Location: [facility];  Service: General;  Laterality: N/A;   CHOLECYSTECTOMY     COLONOSCOPY N/A 12/18/2019   Procedure: COLONOSCOPY;  Surgeon: Rogene Houston, MD;  [facility]: [facility];  Service: Endoscopy;  Laterality: N/A;  [place] Right 05/07/2020   Procedure: CYSTOSCOPY WITH RIGHT RETROGRADE PYELOGRAM;  Surgeon: Cleon Gustin, MD;  Location: [facility];  Service: Urology;  Laterality: Right;   CYSTOSCOPY W/ URETERAL STENT PLACEMENT Right 05/07/2020   Procedure: CYSTOSCOPY WITH RIGHT URETERAL STENT REPLACEMENT;  Surgeon: Cleon Gustin, MD;  Location: [facility];  Service: Urology;  Laterality: Right;   ESOPHAGOGASTRODUODENOSCOPY N/A 10/01/2018   Procedure: ESOPHAGOGASTRODUODENOSCOPY (EGD);  Surgeon: Rogene Houston, MD;  [facility];  Service: Endoscopy;  Laterality: N/A;   ESOPHAGOGASTRODUODENOSCOPY N/A 12/18/2019   Procedure: ESOPHAGOGASTRODUODENOSCOPY (EGD);  Surgeon: Rogene Houston, MD;  [facility];  Service: Endoscopy;  Laterality: N/A;   EXTRACORPOREAL SHOCK WAVE LITHOTRIPSY Right 05/12/2020   Procedure: EXTRACORPOREAL SHOCK WAVE LITHOTRIPSY (ESWL);  Surgeon: Cleon Gustin, MD;  Location: [facility];  Service: Urology;  Laterality: Right;   LAPAROSCOPIC ROUX-EN-Y GASTRIC BYPASS WITH HIATAL HERNIA REPAIR N/A 02/10/2015   Procedure: LAPAROSCOPIC ROUX-EN-Y GASTRIC BYPASS ;  Surgeon: Greer Pickerel, MD;  Location: [facility];  Service: General;  Laterality: N/A;   TUBAL LIGATION     UPPER GI ENDOSCOPY N/A 02/10/2015   Procedure: UPPER GI ENDOSCOPY;  Surgeon: Greer Pickerel, MD;  Location: [facility];  Service: General;  Laterality: N/A;    Home Medications:  Allergies as of 06/01/2020      Reactions   Lisinopril  Cough   Aspirin Hives   Goody Powder-   Triamterene Other (See Comments)   Cramps      Medication List       Accurate as of June 01, 2020  4:31 PM. If you have any questions, ask your nurse or doctor.        acyclovir 400 MG tablet Commonly known as: ZOVIRAX Take 1 tablet (400 mg total) by mouth as needed. What changed: reasons to take this   Align Chew Chew 2 capsules by mouth daily.   CITRACAL PO Take 500 mg by mouth daily.   clobetasol 0.05 %  external solution Commonly known as: TEMOVATE Apply 1 application topically 2 (two) times daily.   cyclobenzaprine 10 MG tablet Commonly known as: FLEXERIL Take 1 tablet (10 mg total) by mouth at bedtime. What changed:   when to take this  reasons to take this   diphenhydrAMINE 50 MG tablet Commonly known as: BENADRYL Take 50 mg by mouth daily.   ergocalciferol 1.25 MG (50000 UT) capsule Commonly known as: VITAMIN D2 Take 1 capsule (50,000 Units total) by mouth once a week. One capsule once weekly What changed: additional instructions   hydrOXYzine 50 MG tablet Commonly known as: ATARAX/VISTARIL TAKE 3 TABLETS BY MOUTH AT BEDTIME What changed: how much to take   multivitamin tablet Take 1 tablet by mouth daily.   Myorisan 40 MG capsule Generic drug: ISOtretinoin Take 40 mg by mouth daily.   olmesartan-hydrochlorothiazide 20-12.5 MG tablet Commonly known as: BENICAR HCT Take 1 tablet by mouth daily.   ondansetron 4 MG disintegrating tablet Commonly known as: Zofran ODT Take 1 tablet (4 mg total) by mouth every 8 (eight) hours as needed for nausea or vomiting.   oxyCODONE-acetaminophen 5-325 MG tablet Commonly known as: Percocet Take 1 tablet by mouth every 4 (four) hours as needed.   pantoprazole 40 MG tablet Commonly known as: PROTONIX Take 1 tablet (40 mg total) by mouth daily before breakfast.   polycarbophil 625 MG tablet Commonly known as: FIBERCON Take 1,250 mg by mouth daily.   tamsulosin 0.4 MG Caps capsule Commonly known as: FLOMAX Take 1 capsule (0.4 mg total) by mouth daily after supper.   traZODone 50 MG tablet Commonly known as: DESYREL TAKE 1 TABLET BY MOUTH AT BEDTIME   Xhance 93 MCG/ACT Exhu Generic drug: Fluticasone Propionate Place 1 puff into the nose in the morning and at bedtime. What changed: when to take this       Allergies:  Allergies  Allergen Reactions   Lisinopril Cough   Aspirin Hives    Goody Powder-    Triamterene Other (See Comments)    Cramps     Family History: Family History  Problem Relation Age of Onset   Hypertension Mother    Diabetes Mother    Kidney disease Mother    Hyperlipidemia Mother    Colon cancer Mother 33       highly suspious of colon mass   Breast cancer Mother 21   Kidney disease Father    Stroke Father    Heart disease Father    Hyperlipidemia Father    Heart attack Father    Diverticulitis Father    Hypertension Brother    Diabetes Brother    Healthy Sister    Other Daughter        hypertension   Kidney disease Daughter    Blindness Daughter    Healthy Daughter    Healthy Son    Cancer  Maternal Aunt 66       gallbladder cancer   Breast cancer Paternal Aunt        dx. in her 18s   Cervical cancer Maternal Grandmother        dx. in her 66s   Prostate cancer Maternal Grandfather        metastatic   Heart attack Paternal Grandmother    Hypertension Paternal Grandmother    Stroke Paternal Grandmother    Lung cancer Paternal Grandfather    Throat cancer Paternal Grandfather    Breast cancer Paternal Aunt        dx. in her 54s   Breast cancer Paternal Aunt        dx. in her 76s   Throat cancer Paternal Uncle    Lung cancer Paternal Uncle    Breast cancer Other        dx. 36s or 42s (mother's first cousins x3)   SIDS Maternal Aunt    Prostate cancer Maternal Uncle        dx. in his 27s, not metastatic   Prostate cancer Maternal Uncle        dx. in his 20s, not metastatic   Breast cancer Cousin        dx. late 44s (paternal cousin)   Breast cancer Other        dx. 40s/early 65s (maternal second cousins x2)    Social History:  reports that she quit smoking about 13 years ago. She has a 0.75 pack-year smoking history. She has never used smokeless tobacco. She reports current alcohol use. She reports that she does not use drugs.  ROS: All other review of systems were reviewed and are negative except  what is noted above in HPI  Physical Exam: There were no vitals taken for this visit.  Constitutional:  Alert and oriented, No acute distress. HEENT: [place] AT, moist mucus membranes.  Trachea midline, no masses. Cardiovascular: No clubbing, cyanosis, or edema. Respiratory: Normal respiratory effort, no increased work of breathing. GI: Abdomen is soft, nontender, nondistended, no abdominal masses GU: No CVA tenderness.  Lymph: No cervical or inguinal lymphadenopathy. Skin: No rashes, bruises or suspicious lesions. Neurologic: Grossly intact, no focal deficits, moving all 4 extremities. Psychiatric: Normal mood and affect.  Laboratory Data: Lab Results  Component Value Date   WBC 13.3 (H) 05/07/2020   HGB 11.7 (L) 05/07/2020   HCT 35.2 (L) 05/07/2020   MCV 89.3 05/07/2020   PLT 296 05/07/2020    Lab Results  Component Value Date   CREATININE 0.85 05/07/2020    No results found for: PSA  No results found for: TESTOSTERONE  Lab Results  Component Value Date   HGBA1C 5.6 03/23/2015    Urinalysis    Component Value Date/Time   COLORURINE YELLOW 05/05/2020 1401   APPEARANCEUR Cloudy (A) 05/15/2020 1323   LABSPEC 1.028 05/05/2020 1401   PHURINE 5.0 05/05/2020 1401   GLUCOSEU Negative 05/15/2020 1323   HGBUR NEGATIVE 05/05/2020 1401   BILIRUBINUR Negative 05/15/2020 1323   KETONESUR 5 (A) 05/05/2020 1401   PROTEINUR 2+ (A) 05/15/2020 1323   PROTEINUR NEGATIVE 05/05/2020 1401   UROBILINOGEN 0.2 01/10/2019 1026   UROBILINOGEN 0.2 08/29/2012 2335   NITRITE Negative 05/15/2020 1323   NITRITE NEGATIVE 05/05/2020 1401   LEUKOCYTESUR 1+ (A) 05/15/2020 1323   LEUKOCYTESUR NEGATIVE 05/05/2020 1401    Lab Results  Component Value Date   LABMICR See below: 05/15/2020   WBCUA 0-5 05/15/2020   LABEPIT 0-10  05/15/2020   MUCUS Present 05/15/2020   BACTERIA Many (A) 05/15/2020    Pertinent Imaging: KUB today: Images reviewed and discussed with the patient Results for orders  placed during the hospital encounter of 05/29/20  Abdomen 1 view (KUB)  Narrative CLINICAL DATA:  Nephrolithiasis.  Lithotripsy 2 weeks ago.  EXAM: ABDOMEN - 1 VIEW  COMPARISON:  05/12/2020.  FINDINGS: The bowel gas pattern is normal. No radio-opaque calculi, although overlying bowel gas limits evaluation. No other significant radiographic abnormality are seen. Cholecystectomy clips.  IMPRESSION: No radiopaque renal or ureteric calculi identified.   Electronically Signed By: Margaretha Sheffield MD On: 06/01/2020 15:21  No results found for this or any previous visit.  No results found for this or any previous visit.  No results found for this or any previous visit.  No results found for this or any previous visit.  No results found for this or any previous visit.  No results found for this or any previous visit.  No results found for this or any previous visit.   Assessment & Plan:    1. Nephrolithiasis -She will followup in 3 months for KUB   No follow-ups on file.  Nicolette Bang, MD  [facility] [place]

## 2020-06-01 NOTE — Patient Instructions (Signed)
Dietary Guidelines to Help Prevent Kidney Stones Kidney stones are deposits of minerals and salts that form inside your kidneys. Your risk of developing kidney stones may be greater depending on your diet, your lifestyle, the medicines you take, and whether you have certain medical conditions. Most people can reduce their chances of developing kidney stones by following the instructions below. Depending on your overall health and the type of kidney stones you tend to develop, your dietitian may give you more specific instructions. What are tips for following this plan? Reading food labels  Choose foods with "no salt added" or "low-salt" labels. Limit your sodium intake to less than 1500 mg per day.  Choose foods with calcium for each meal and snack. Try to eat about 300 mg of calcium at each meal. Foods that contain 200-500 mg of calcium per serving include: ? 8 oz (237 ml) of milk, fortified nondairy milk, and fortified fruit juice. ? 8 oz (237 ml) of kefir, yogurt, and soy yogurt. ? 4 oz (118 ml) of tofu. ? 1 oz of cheese. ? 1 cup (300 g) of dried figs. ? 1 cup (91 g) of cooked broccoli. ? 1-3 oz can of sardines or mackerel.  Most people need 1000 to 1500 mg of calcium each day. Talk to your dietitian about how much calcium is recommended for you. Shopping  Buy plenty of fresh fruits and vegetables. Most people do not need to avoid fruits and vegetables, even if they contain nutrients that may contribute to kidney stones.  When shopping for convenience foods, choose: ? Whole pieces of fruit. ? Premade salads with dressing on the side. ? Low-fat fruit and yogurt smoothies.  Avoid buying frozen meals or prepared deli foods.  Look for foods with live cultures, such as yogurt and kefir. Cooking  Do not add salt to food when cooking. Place a salt shaker on the table and allow each person to add his or her own salt to taste.  Use vegetable protein, such as beans, textured vegetable  protein (TVP), or tofu instead of meat in pasta, casseroles, and soups. Meal planning   Eat less salt, if told by your dietitian. To do this: ? Avoid eating processed or premade food. ? Avoid eating fast food.  Eat less animal protein, including cheese, meat, poultry, or fish, if told by your dietitian. To do this: ? Limit the number of times you have meat, poultry, fish, or cheese each week. Eat a diet free of meat at least 2 days a week. ? Eat only one serving each day of meat, poultry, fish, or seafood. ? When you prepare animal protein, cut pieces into small portion sizes. For most meat and fish, one serving is about the size of one deck of cards.  Eat at least 5 servings of fresh fruits and vegetables each day. To do this: ? Keep fruits and vegetables on hand for snacks. ? Eat 1 piece of fruit or a handful of berries with breakfast. ? Have a salad and fruit at lunch. ? Have two kinds of vegetables at dinner.  Limit foods that are high in a substance called oxalate. These include: ? Spinach. ? Rhubarb. ? Beets. ? Potato chips and french fries. ? Nuts.  If you regularly take a diuretic medicine, make sure to eat at least 1-2 fruits or vegetables high in potassium each day. These include: ? Avocado. ? Banana. ? Orange, prune, carrot, or tomato juice. ? Baked potato. ? Cabbage. ? Beans and split   peas. General instructions   Drink enough fluid to keep your urine clear or pale yellow. This is the most important thing you can do.  Talk to your health care provider and dietitian about taking daily supplements. Depending on your health and the cause of your kidney stones, you may be advised: ? Not to take supplements with vitamin C. ? To take a calcium supplement. ? To take a daily probiotic supplement. ? To take other supplements such as magnesium, fish oil, or vitamin B6.  Take all medicines and supplements as told by your health care provider.  Limit alcohol intake to no  more than 1 drink a day for nonpregnant women and 2 drinks a day for men. One drink equals 12 oz of beer, 5 oz of wine, or 1 oz of hard liquor.  Lose weight if told by your health care provider. Work with your dietitian to find strategies and an eating plan that works best for you. What foods are not recommended? Limit your intake of the following foods, or as told by your dietitian. Talk to your dietitian about specific foods you should avoid based on the type of kidney stones and your overall health. Grains Breads. Bagels. Rolls. Baked goods. Salted crackers. Cereal. Pasta. Vegetables Spinach. Rhubarb. Beets. Canned vegetables. Pickles. Olives. Meats and other protein foods Nuts. Nut butters. Large portions of meat, poultry, or fish. Salted or cured meats. Deli meats. Hot dogs. Sausages. Dairy Cheese. Beverages Regular soft drinks. Regular vegetable juice. Seasonings and other foods Seasoning blends with salt. Salad dressings. Canned soups. Soy sauce. Ketchup. Barbecue sauce. Canned pasta sauce. Casseroles. Pizza. Lasagna. Frozen meals. Potato chips. French fries. Summary  You can reduce your risk of kidney stones by making changes to your diet.  The most important thing you can do is drink enough fluid. You should drink enough fluid to keep your urine clear or pale yellow.  Ask your health care provider or dietitian how much protein from animal sources you should eat each day, and also how much salt and calcium you should have each day. This information is not intended to replace advice given to you by your health care provider. Make sure you discuss any questions you have with your health care provider. Document Revised: 10/31/2018 Document Reviewed: 06/21/2016 Elsevier Patient Education  2020 [employer] Inc[employer]  

## 2020-06-10 DIAGNOSIS — Z9884 Bariatric surgery status: Secondary | ICD-10-CM | POA: Diagnosis not present

## 2020-06-10 DIAGNOSIS — L700 Acne vulgaris: Secondary | ICD-10-CM | POA: Diagnosis not present

## 2020-06-10 DIAGNOSIS — Z79899 Other long term (current) drug therapy: Secondary | ICD-10-CM | POA: Diagnosis not present

## 2020-06-11 LAB — HEPATITIS PANEL, ACUTE
Hep A IgM: NONREACTIVE
Hep B C IgM: NONREACTIVE
Hepatitis B Surface Ag: NONREACTIVE
Hepatitis C Ab: NONREACTIVE
SIGNAL TO CUT-OFF: 0.01 (ref <1.00)

## 2020-06-16 ENCOUNTER — Other Ambulatory Visit (HOSPITAL_COMMUNITY): Payer: Self-pay | Admitting: Dermatology

## 2020-06-16 DIAGNOSIS — L700 Acne vulgaris: Secondary | ICD-10-CM | POA: Diagnosis not present

## 2020-06-16 MED FILL — MYORISAN 40 MG CAPSULE: 40 | 30 days supply | Qty: 60 | Fill #0

## 2020-07-21 ENCOUNTER — Ambulatory Visit: Payer: 59 | Admitting: Orthopaedic Surgery

## 2020-07-27 ENCOUNTER — Other Ambulatory Visit: Payer: Self-pay | Admitting: Family Medicine

## 2020-07-27 ENCOUNTER — Encounter: Payer: Self-pay | Admitting: Family Medicine

## 2020-07-27 MED FILL — hydrOXYzine HCL 50 MG TABS: 50 | 90 days supply | Qty: 270 | Fill #0

## 2020-07-27 MED FILL — OLMESARTAN-HCTZ 20-12.5 MG: 20-12.5 | 90 days supply | Qty: 90 | Fill #2

## 2020-07-27 MED FILL — traZODone HCL 50 MG TABS: 50 | 90 days supply | Qty: 90 | Fill #1

## 2020-07-27 MED FILL — ACYCLOVIR 400 MG TABLET: 400 | 90 days supply | Qty: 90 | Fill #1

## 2020-07-28 ENCOUNTER — Telehealth: Payer: 59 | Admitting: Family Medicine

## 2020-07-28 ENCOUNTER — Other Ambulatory Visit: Payer: Self-pay

## 2020-07-28 MED FILL — CYCLOBENZAPRINE HCL 10 MG T: 10 | 90 days supply | Qty: 90 | Fill #1

## 2020-08-05 ENCOUNTER — Encounter: Payer: Self-pay | Admitting: Family Medicine

## 2020-08-06 ENCOUNTER — Other Ambulatory Visit: Payer: Self-pay

## 2020-08-06 ENCOUNTER — Encounter: Payer: Self-pay | Admitting: Family Medicine

## 2020-08-06 ENCOUNTER — Telehealth (INDEPENDENT_AMBULATORY_CARE_PROVIDER_SITE_OTHER): Payer: 59 | Admitting: Family Medicine

## 2020-08-06 VITALS — BP 126/86 | HR 82 | Wt 228.0 lb

## 2020-08-06 DIAGNOSIS — J029 Acute pharyngitis, unspecified: Secondary | ICD-10-CM | POA: Insufficient documentation

## 2020-08-06 MED ORDER — AZITHROMYCIN 250 MG PO TABS: ORAL_TABLET | ORAL | 0 refills | Status: DC

## 2020-08-06 NOTE — Progress Notes (Signed)
Virtual Visit via Telephone Note  I connected with Kristine Lawson on 08/06/20 at  9:00 AM EST by telephone and verified that I am speaking with the correct person using two identifiers.  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 I also discussed with the patient that there may be a patient responsible charge related to this service. The patient expressed understanding and agreed to proceed.   History of Present Illness: Dx with Covid 07/19/2020, multiple symptoms back at work  Jan 6 On jan 10 sore throat , swollen glands, and has noted exudate on one tonsil yesterday. No fevr or chills,  No known Strep contact reported. Otherwise no health concerns   Observations/Objective: BP 126/86   Pulse 82   Wt 228 lb (103.4 kg)   SpO2 97%   BMI 34.67 kg/m   Good communication with no confusion and intact memory. Alert and oriented x 3 No signs of respiratory distress during speech     Assessment and Plan:  Sore throat Likely viral but need to r/o strep, advised to get rapid strep test today, Z pack prescribed. States she will likely get test tomorrow as she is currently at work   Follow Up Instructions:    I discussed the assessment and treatment plan with the patient. The patient was provided an opportunity to ask questions and all were answered. The patient agreed with the plan and demonstrated an understanding of the instructions.   The patient was advised to call back or seek an in-person evaluation if the symptoms worsen or if the condition fails to improve as anticipated.  I provided 10 minutes of non-face-to-face time during this encounter.   Tula Nakayama, MD

## 2020-08-06 NOTE — Patient Instructions (Signed)
Follow-up as before call if you need me sooner.  You are treated for presumed strep throat based on the symptoms you described.  Please get rapid strep test done today if possible, let nurse know when you are coming in, the office closes at mid day on Friday,  but no later than tomorrow.  5-day antibiotic course as prescribed and is at your local pharmacy.as we discussed  Hope  You feel better soon  Thanks for choosing [facility], we consider it a privelige to serve you.   It is important that you exercise regularly at least 30 minutes 5 times a week. If you develop chest pain, have severe difficulty breathing, or feel very tired, stop exercising immediately and seek medical attention  Think about what you will eat, plan ahead. Choose " clean, green, fresh or frozen" over canned, processed or packaged foods which are more sugary, salty and fatty. 70 to 75% of food eaten should be vegetables and fruit. Three meals at set times with snacks allowed between meals, but they must be fruit or vegetables. Aim to eat over a 12 hour period , example 7 am to 7 pm, and STOP after  your last meal of the day. Drink water,generally about 64 ounces per day, no other drink is as healthy. Fruit juice is best enjoyed in a healthy way, by EATING the fruit.

## 2020-08-06 NOTE — Assessment & Plan Note (Signed)
Likely viral but need to r/o strep, advised to get rapid strep test today, Z pack prescribed. States she will likely get test tomorrow as she is currently at work

## 2020-08-07 ENCOUNTER — Ambulatory Visit (INDEPENDENT_AMBULATORY_CARE_PROVIDER_SITE_OTHER): Payer: 59

## 2020-08-07 ENCOUNTER — Other Ambulatory Visit: Payer: Self-pay

## 2020-08-07 DIAGNOSIS — J029 Acute pharyngitis, unspecified: Secondary | ICD-10-CM

## 2020-08-07 LAB — POCT RAPID STREP A (OFFICE): Rapid Strep A Screen: NEGATIVE

## 2020-08-10 ENCOUNTER — Ambulatory Visit: Payer: 59 | Admitting: Physician Assistant

## 2020-08-11 ENCOUNTER — Other Ambulatory Visit: Payer: Self-pay | Admitting: Family Medicine

## 2020-08-11 ENCOUNTER — Ambulatory Visit: Payer: 59 | Admitting: Orthopaedic Surgery

## 2020-08-11 MED ORDER — PROMETHAZINE-DM 6.25-15 MG/5ML PO SYRP: ORAL_SOLUTION | ORAL | 0 refills | Status: DC

## 2020-08-11 MED ORDER — PREDNISONE 10 MG PO TABS: 10.0000 mg | ORAL_TABLET | Freq: Two times a day (BID) | ORAL | 0 refills | Status: DC

## 2020-08-15 MED FILL — PANTOPRAZOLE SOD DR 40 MG T: 40 | 90 days supply | Qty: 180 | Fill #1

## 2020-08-19 ENCOUNTER — Ambulatory Visit (INDEPENDENT_AMBULATORY_CARE_PROVIDER_SITE_OTHER): Payer: 59

## 2020-08-19 ENCOUNTER — Other Ambulatory Visit (HOSPITAL_COMMUNITY): Payer: Self-pay | Admitting: Dermatology

## 2020-08-19 ENCOUNTER — Ambulatory Visit: Payer: Self-pay

## 2020-08-19 ENCOUNTER — Ambulatory Visit (INDEPENDENT_AMBULATORY_CARE_PROVIDER_SITE_OTHER): Payer: 59 | Admitting: Orthopaedic Surgery

## 2020-08-19 DIAGNOSIS — M25562 Pain in left knee: Secondary | ICD-10-CM | POA: Diagnosis not present

## 2020-08-19 DIAGNOSIS — M25561 Pain in right knee: Secondary | ICD-10-CM | POA: Diagnosis not present

## 2020-08-19 DIAGNOSIS — G8929 Other chronic pain: Secondary | ICD-10-CM

## 2020-08-19 DIAGNOSIS — M79671 Pain in right foot: Secondary | ICD-10-CM

## 2020-08-19 DIAGNOSIS — L700 Acne vulgaris: Secondary | ICD-10-CM | POA: Diagnosis not present

## 2020-08-19 MED ORDER — BUPIVACAINE HCL 0.5 % IJ SOLN
2.0000 mL | INTRAMUSCULAR | Status: AC | PRN
  Administered 2020-08-19: 2 mL via INTRA_ARTICULAR

## 2020-08-19 MED ORDER — METHYLPREDNISOLONE ACETATE 40 MG/ML IJ SUSP
40.0000 mg | INTRAMUSCULAR | Status: AC | PRN
Start: 2020-08-19 — End: 2020-08-19
  Administered 2020-08-19: 40 mg via INTRA_ARTICULAR

## 2020-08-19 MED ORDER — LIDOCAINE HCL 1 % IJ SOLN
2.0000 mL | INTRAMUSCULAR | Status: AC | PRN
  Administered 2020-08-19: 2 mL

## 2020-08-19 MED FILL — MYORISAN 40 MG CAPSULE: 40 | 30 days supply | Qty: 60 | Fill #0

## 2020-08-19 NOTE — Progress Notes (Signed)
Office Visit Note   Patient: Kristine Lawson           Date of Birth: 1971/03/05           MRN: [id number] Visit Date: 08/19/2020              Requested by: Fayrene Helper, MD [id number] [place],  [place] [address] PCP: Fayrene Helper, MD   Assessment & Plan: Visit Diagnoses:  1. Chronic pain of left knee   2. 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  Based on discussion of treatment options she would like to try Pennsaid and relative rest.  She declined a cam boot.  She will do Achilles stretches at home.  She is unable to take NSAIDs as this has caused stomach ulcers in the past.  Declined physical therapy for now given work constraints.  For the left knee she agreed to a cortisone injection.  She will try some kinesiotaping.  We also talked about importance of excess weight and how this affects knee pain specially patellofemoral knee pain.  Questions encouraged and answered.  Follow-up as needed.  Follow-Up Instructions: No follow-ups on file.   Orders:  Orders Placed This Encounter  Procedures  . XR KNEE 3 VIEW LEFT  . XR Os Calcis Right   No orders of the defined types were placed in this encounter.     Procedures: Large Joint Inj: L knee on 08/19/2020 11:40 AM Details: 22 G needle Medications: 2 mL bupivacaine 0.5 %; 2 mL lidocaine 1 %; 40 mg methylPREDNISolone acetate 40 MG/ML Outcome: tolerated well, no immediate complications Patient was prepped and draped in the usual sterile fashion.       Clinical Data: No additional findings.   Subjective: Chief Complaint  Patient presents with  . Left Knee - Pain  . Right Foot - Pain    HEEL PAIN     Kristine Lawson is a 50 year old female works as a Marine scientist at cardiology office who comes in for evaluation of right heel and left knee pain since April.  Denies any injuries.  She does play tennis fairly  regularly.  She also walks for exercise.  She has a burning pain in the back of her heel.  Denies any mechanical symptoms in her left knee.  She originally had some swelling but was recently placed on a prednisone Dosepak by her PCP which has really helped everything.  She has trouble squatting and using steps at work.   Review of Systems  Constitutional: Negative.   HENT: Negative.   Eyes: Negative.   Respiratory: Negative.   Cardiovascular: Negative.   Endocrine: Negative.   Musculoskeletal: Negative.   Neurological: Negative.   Hematological: Negative.   Psychiatric/Behavioral: Negative.   All other systems reviewed and are negative.    Objective: Vital Signs: There were no vitals taken for this visit.  Physical Exam Vitals and nursing note reviewed.  Constitutional:      Appearance: She is well-developed and well-nourished.  HENT:     Head: Normocephalic and atraumatic.  Eyes:     Extraocular Movements: EOM normal.  Pulmonary:     Effort: Pulmonary effort is normal.  Abdominal:     Palpations: Abdomen is soft.  Musculoskeletal:     Cervical back: Neck supple.  Skin:    General: Skin is warm.     Capillary Refill: Capillary refill  takes less than 2 seconds.  Neurological:     Mental Status: She is alert and oriented to person, place, and time.  Psychiatric:        Mood and Affect: Mood and affect normal.        Behavior: Behavior normal.        Thought Content: Thought content normal.        Judgment: Judgment normal.     Ortho Exam Right heel shows tenderness at the distal insertion of the Achilles.  She has some mild pain with passive stretch of the Achilles.  Plantar fascia nontender.  Negative calcaneal squeeze.  Achilles tendon itself is nontender.  Left knee shows patellofemoral crepitus with range of motion.  Overall valgus alignment.  No joint effusion.  Collaterals and cruciates are stable.  Specialty Comments:  No specialty comments  available.  Imaging: XR KNEE 3 VIEW LEFT  Result Date: 08/19/2020 Moderate tricompartment DJD worse in the patellofemoral compartment with laterally tracking patellas  XR Os Calcis Right  Result Date: 08/19/2020 Calcification in the distal Achilles consistent with Achilles tendinopathy    PMFS History: Patient Active Problem List   Diagnosis Date Noted  . Sore throat 08/06/2020  . Nephrolithiasis 06/01/2020  . Hospital discharge follow-up 05/24/2020  . Ureteral stone with hydronephrosis 05/09/2020  . Ureteral calculus 05/07/2020  . Allergic rhinitis 01/15/2020  . Genetic testing 12/16/2019  . Family history of breast cancer   . Family history of prostate cancer   . Family history of cancer of gallbladder   . Family history of cervical cancer   . Family history of lung cancer   . Family history of throat cancer   . Tendinitis 11/13/2019  . Trapezius strain 11/13/2019  . Family history of cancer 10/28/2019  . Arthritis 06/05/2019  . 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 Abnormal urine odor 09/01/2018  . Muscle spasm 02/04/2018  . Goiter 10/07/2015  . S/P gastric bypass 02/10/2015  . Insomnia 04/05/2014  . Dyslipidemia 04/05/2014  . Metabolic syndrome X 19/37/9024  . Vitamin D deficiency 04/05/2014  . Psychosocial stressors 12/21/2012  . Obesity (BMI 30.0-34.9) 03/05/2011  . Acute pain of left knee 05/01/2008  . PES PLANUS 05/01/2008  . Acne vulgaris 09/28/2007  . HYPERTENSION, BENIGN ESSENTIAL 10/24/2006   Past Medical History:  Diagnosis Date  . Allergy   . Anemia    IDA  . Arthritis   . Arthritis   . Family history of breast cancer   . Family history of cancer of gallbladder   . Family history of cervical cancer   . Family history of lung cancer   . Family history of prostate cancer   . Family history of throat cancer   . GERD (gastroesophageal reflux disease)   . History of stomach ulcers   . Hypertension   . Insomnia    meds needed  daily to achieve 6+ hours sleep.    Family History  Problem Relation Age of Onset  . Hypertension Mother   . Diabetes Mother   . Kidney disease Mother   . Hyperlipidemia Mother   . Colon cancer Mother 31       highly suspious of colon mass  . Breast cancer Mother 43  . Kidney disease Father   . Stroke Father   . Heart disease Father   . Hyperlipidemia Father   . Heart attack Father   . Diverticulitis Father   . Hypertension Brother   . Diabetes Brother   .  Healthy Sister   . Other Daughter        hypertension  . Kidney disease Daughter   . Blindness Daughter   . Healthy Daughter   . Healthy Son   . Cancer Maternal Aunt 66       gallbladder cancer  . Breast cancer Paternal Aunt        dx. in her 4s  . Cervical cancer Maternal Grandmother        dx. in her 1s  . Prostate cancer Maternal Grandfather        metastatic  . Heart attack Paternal Grandmother   . Hypertension Paternal Grandmother   . Stroke Paternal Grandmother   . Lung cancer Paternal Grandfather   . Throat cancer Paternal Grandfather   . Breast cancer Paternal Aunt        dx. in her 43s  . Breast cancer Paternal Aunt        dx. in her 73s  . Throat cancer Paternal Uncle   . Lung cancer Paternal Uncle   . Breast cancer Other        dx. 60s or 60s (mother's first cousins x3)  . SIDS Maternal Aunt   . Prostate cancer Maternal Uncle        dx. in his 89s, not metastatic  . Prostate cancer Maternal Uncle        dx. in his 47s, not metastatic  . Breast cancer Cousin        dx. late 44s (paternal cousin)  . Breast cancer Other        dx. 40s/early 2s (maternal second cousins x2)    Past Surgical History:  Procedure Laterality Date  . ABLATION     uterine ablation  . APPENDECTOMY    . BIOPSY  12/18/2019   Procedure: BIOPSY;  Surgeon: Rogene Houston, MD;  [facility];  Service: Endoscopy;;  gastric  . BREATH TEK H PYLORI N/A 11/18/2014   Procedure: BREATH TEK H PYLORI;  Surgeon: Greer Pickerel, MD;  Location: [facility];  Service: General;  Laterality: N/A;  . CHOLECYSTECTOMY    . COLONOSCOPY N/A 12/18/2019   Procedure: COLONOSCOPY;  Surgeon: Rogene Houston, MD;  [facility]: [facility];  Service: Endoscopy;  Laterality: N/A;  930  . CYSTOSCOPY W/ RETROGRADES Right 05/07/2020   Procedure: CYSTOSCOPY WITH RIGHT RETROGRADE PYELOGRAM;  Surgeon: Cleon Gustin, MD;  Location: [facility];  Service: Urology;  Laterality: Right;  . CYSTOSCOPY W/ URETERAL STENT PLACEMENT Right 05/07/2020   Procedure: CYSTOSCOPY WITH RIGHT URETERAL STENT REPLACEMENT;  Surgeon: Cleon Gustin, MD;  Location: [facility];  Service: Urology;  Laterality: Right;  . ESOPHAGOGASTRODUODENOSCOPY N/A 10/01/2018   Procedure: ESOPHAGOGASTRODUODENOSCOPY (EGD);  Surgeon: Rogene Houston, MD;  [facility];  Service: Endoscopy;  Laterality: N/A;  . ESOPHAGOGASTRODUODENOSCOPY N/A 12/18/2019   Procedure: ESOPHAGOGASTRODUODENOSCOPY (EGD);  Surgeon: Rogene Houston, MD;  [facility];  Service: Endoscopy;  Laterality: N/A;  . EXTRACORPOREAL SHOCK WAVE LITHOTRIPSY Right 05/12/2020   Procedure: EXTRACORPOREAL SHOCK WAVE LITHOTRIPSY (ESWL);  Surgeon: Cleon Gustin, MD;  Location: [facility];  Service: Urology;  Laterality: Right;  . LAPAROSCOPIC ROUX-EN-Y GASTRIC BYPASS WITH HIATAL HERNIA REPAIR N/A 02/10/2015   Procedure: LAPAROSCOPIC ROUX-EN-Y GASTRIC BYPASS ;  Surgeon: Greer Pickerel, MD;  Location: [facility];  Service: General;  Laterality: N/A;  . TUBAL LIGATION    . UPPER GI ENDOSCOPY N/A 02/10/2015   Procedure: UPPER GI ENDOSCOPY;  Surgeon: Randall Hiss  Redmond Pulling, MD;  Location: [facility];  Service: General;  Laterality: N/A;   Social History   Occupational History  . Occupation: CARDIOLOGY    Employer: [employer]  . Occupation: Therapist, sports  Tobacco Use  . Smoking status: Former Smoker    Packs/day: 0.25    Years: 3.00    Pack years: 0.75    Quit date: 02/05/2007    Years since  quitting: 13.5  . Smokeless tobacco: Never Used  Vaping Use  . Vaping Use: Never used  Substance and Sexual Activity  . Alcohol use: Yes    Comment: occassional wine 2-3 times a year  . Drug use: No  . Sexual activity: Yes    Birth control/protection: Surgical

## 2020-09-14 ENCOUNTER — Ambulatory Visit: Payer: 59 | Admitting: Family Medicine

## 2020-09-21 ENCOUNTER — Other Ambulatory Visit (HOSPITAL_COMMUNITY): Payer: Self-pay | Admitting: Dermatology

## 2020-09-21 DIAGNOSIS — L700 Acne vulgaris: Secondary | ICD-10-CM | POA: Diagnosis not present

## 2020-10-12 ENCOUNTER — Other Ambulatory Visit (HOSPITAL_BASED_OUTPATIENT_CLINIC_OR_DEPARTMENT_OTHER): Payer: Self-pay

## 2020-10-19 ENCOUNTER — Ambulatory Visit: Payer: 59 | Admitting: Family Medicine

## 2020-10-27 ENCOUNTER — Other Ambulatory Visit (HOSPITAL_COMMUNITY): Payer: Self-pay

## 2020-11-02 ENCOUNTER — Other Ambulatory Visit (HOSPITAL_COMMUNITY): Payer: Self-pay

## 2020-11-02 MED FILL — Olmesartan Medoxomil-Hydrochlorothiazide Tab 20-12.5 MG: ORAL | 90 days supply | Qty: 90 | Fill #0 | Status: AC

## 2020-11-03 ENCOUNTER — Encounter: Payer: Self-pay | Admitting: Family Medicine

## 2020-11-03 ENCOUNTER — Other Ambulatory Visit (HOSPITAL_COMMUNITY): Payer: Self-pay

## 2020-11-03 MED ORDER — XHANCE 93 MCG/ACT NA EXHU
INHALANT_SUSPENSION | NASAL | 2 refills | Status: DC
  Filled 2020-11-03: qty 16, 30d supply, fill #0
  Filled 2020-12-25: qty 16, 30d supply, fill #1

## 2020-11-04 ENCOUNTER — Other Ambulatory Visit (HOSPITAL_COMMUNITY): Payer: Self-pay

## 2020-11-05 ENCOUNTER — Other Ambulatory Visit (HOSPITAL_COMMUNITY): Payer: Self-pay

## 2020-11-11 ENCOUNTER — Encounter: Payer: Self-pay | Admitting: Family Medicine

## 2020-11-11 ENCOUNTER — Other Ambulatory Visit: Payer: Self-pay | Admitting: Family Medicine

## 2020-11-11 ENCOUNTER — Other Ambulatory Visit (HOSPITAL_COMMUNITY): Payer: Self-pay

## 2020-11-11 ENCOUNTER — Telehealth: Payer: 59 | Admitting: Family Medicine

## 2020-11-11 ENCOUNTER — Other Ambulatory Visit: Payer: Self-pay

## 2020-11-11 VITALS — BP 113/70 | Ht 68.0 in | Wt 230.0 lb

## 2020-11-11 DIAGNOSIS — L700 Acne vulgaris: Secondary | ICD-10-CM | POA: Diagnosis not present

## 2020-11-11 DIAGNOSIS — I10 Essential (primary) hypertension: Secondary | ICD-10-CM | POA: Diagnosis not present

## 2020-11-11 DIAGNOSIS — Z658 Other specified problems related to psychosocial circumstances: Secondary | ICD-10-CM

## 2020-11-11 DIAGNOSIS — E669 Obesity, unspecified: Secondary | ICD-10-CM | POA: Diagnosis not present

## 2020-11-11 DIAGNOSIS — F5104 Psychophysiologic insomnia: Secondary | ICD-10-CM | POA: Diagnosis not present

## 2020-11-11 DIAGNOSIS — J309 Allergic rhinitis, unspecified: Secondary | ICD-10-CM

## 2020-11-11 MED ORDER — SPIRONOLACTONE 25 MG PO TABS
25.0000 mg | ORAL_TABLET | Freq: Every day | ORAL | 3 refills | Status: DC
Start: 2020-11-11 — End: 2021-03-29
  Filled 2020-11-11: qty 90, 90d supply, fill #0

## 2020-11-11 MED ORDER — LIRAGLUTIDE 18 MG/3ML ~~LOC~~ SOPN
PEN_INJECTOR | SUBCUTANEOUS | 0 refills | Status: DC
  Filled 2020-11-11: qty 9, 30d supply, fill #0

## 2020-11-11 MED ORDER — LIRAGLUTIDE 18 MG/3ML ~~LOC~~ SOPN
PEN_INJECTOR | SUBCUTANEOUS | 0 refills | Status: DC
  Filled 2020-11-11: qty 9, 30d supply, fill #0
  Filled 2020-11-18: qty 9, 56d supply, fill #0

## 2020-11-11 NOTE — Assessment & Plan Note (Signed)
Controlled, no change in medication  

## 2020-11-11 NOTE — Assessment & Plan Note (Signed)
SCRIPT FOR DAILY SPIRONOLACTONE SENT IN

## 2020-11-11 NOTE — Progress Notes (Signed)
Virtual Visit via Telephone Note  I connected with Kristine Lawson on 11/11/20 at  8:20 AM EDT by telephone and verified that I am speaking with the correct person using two identifiers.  Location: Patient: work Provider: office   I discussed the limitations, risks, security and privacy concerns of performing an evaluation and management service by telephone and the availability of in person appointments. I also discussed with the patient that there may be a patient responsible charge related to this service. The patient expressed understanding and agreed to proceed.   History of Present Illness:   F/U chronic problems and address any new or current concerns. Review and update medications and allergies. Review recent lab and radiologic data . Update routine health maintainace. Review an encourage improved health habits to include nutrition, exercise and  sleep .  Denies recent fever or chills. C/o personal stress daughter was living with her for a few weeks in [place], has weight gain, phentermine not beneficial Denies sinus pressure, nasal congestion, ear pain or sore throat. Denies chest congestion, productive cough or wheezing. Denies chest pains, palpitations and leg swelling Denies abdominal pain, nausea, vomiting,diarrhea or constipation.   Denies dysuria, frequency, hesitancy or incontinence. C/o heel pain and bilateral k knee pain, unable to exercise Denies headaches, seizures, numbness, or tingling. Denies depression, anxiety or uncontrolled  insomnia. Facial acne is improving, requests maintainace med   Observations/Objective: BP 113/70   Ht 5\' 8"  (1.727 m)   Wt 230 lb (104.3 kg)   BMI 34.97 kg/m    Good communication with no confusion and intact memory. Alert and oriented x 3 No signs of respiratory distress during speech   Assessment and Plan:  HYPERTENSION, BENIGN ESSENTIAL Controlled, no change in medication DASH diet and commitment to daily physical  activity for a minimum of 30 minutes discussed and encouraged, as a part of hypertension management. The importance of attaining a healthy weight is also discussed.  BP/Weight 11/11/2020 08/06/2020 05/18/2020 05/15/2020 05/12/2020 05/08/2020 35/70/1779  Systolic BP 390 300 923 300 762 263 -  Diastolic BP 70 86 77 84 78 82 -  Wt. (Lbs) 230 228 225 235 234.79 234.79 -  BMI 34.97 34.67 34.21 35.73 35.7 - 35.7       Psychosocial stressors Ongoing challenge due to illness in children  Obesity (BMI 30.0-34.9) Deteriorate, trial of saxendA Patient re-educated about  the importance of commitment to a  minimum of 150 minutes of exercise per week as able.  The importance of healthy food choices with portion control discussed, as well as eating regularly and within a 12 hour window most days. The need to choose "clean , green" food 50 to 75% of the time is discussed, as well as to make water the primary drink and set a goal of 64 ounces water daily.    Weight /BMI 11/11/2020 08/06/2020 05/18/2020  WEIGHT 230 lb 228 lb 225 lb  HEIGHT 5\' 8"  - 5\' 8"   BMI 34.97 kg/m2 34.67 kg/m2 34.21 kg/m2    Start victoza , f/u in 12 weeks  Insomnia Controlled, no change in medication   Allergic rhinitis Controlled, no change in medication    Follow Up Instructions:    I discussed the assessment and treatment plan with the patient. The patient was provided an opportunity to ask questions and all were answered. The patient agreed with the plan and demonstrated an understanding of the instructions.   The patient was advised to call back or seek an in-person evaluation if  the symptoms worsen or if the condition fails to improve as anticipated.  I provided 20 minutes of non-face-to-face time during this encounter.   Tula Nakayama, MD

## 2020-11-11 NOTE — Assessment & Plan Note (Signed)
Ongoing challenge due to illness in children

## 2020-11-11 NOTE — Patient Instructions (Addendum)
F/U in office with MD in 3 months, call if you need me sooner  New is medication for weight loss , take as directed , titrating up dose as tolerated., per the direction Plan to stop eating after 7 pm , drink at least 64 oz water daily  Start swimming for exercise and upper body exercise  Spironolactone is prescribed for acne  Thanks for choosing [facility], we consider it a privelige to serve you.    Calorie Counting for Weight Loss Calories are units of energy. Your body needs a certain number of calories from food to keep going throughout the day. When you eat or drink more calories than your body needs, your body stores the extra calories mostly as fat. When you eat or drink fewer calories than your body needs, your body burns fat to get the energy it needs. Calorie counting means keeping track of how many calories you eat and drink each day. Calorie counting can be helpful if you need to lose weight. If you eat fewer calories than your body needs, you should lose weight. Ask your health care provider what a healthy weight is for you. For calorie counting to work, you will need to eat the right number of calories each day to lose a healthy amount of weight per week. A dietitian can help you figure out how many calories you need in a day and will suggest ways to reach your calorie goal.  A healthy amount of weight to lose each week is usually 1-2 lb (0.5-0.9 kg). This usually means that your daily calorie intake should be reduced by 500-750 calories.  Eating 1,200-1,500 calories a day can help most women lose weight.  Eating 1,500-1,800 calories a day can help most men lose weight. What do I need to know about calorie counting? Work with your health care provider or dietitian to determine how many calories you should get each day. To meet your daily calorie goal, you will need to:  Find out how many calories are in each food that you would like to eat. Try to do this before  you eat.  Decide how much of the food you plan to eat.  Keep a food log. Do this by writing down what you ate and how many calories it had. To successfully lose weight, it is important to balance calorie counting with a healthy lifestyle that includes regular activity. Where do I find calorie information? The number of calories in a food can be found on a Nutrition Facts label. If a food does not have a Nutrition Facts label, try to look up the calories online or ask your dietitian for help. Remember that calories are listed per serving. If you choose to have more than one serving of a food, you will have to multiply the calories per serving by the number of servings you plan to eat. For example, the label on a package of bread might say that a serving size is 1 slice and that there are 90 calories in a serving. If you eat 1 slice, you will have eaten 90 calories. If you eat 2 slices, you will have eaten 180 calories.   How do I keep a food log? After each time that you eat, record the following in your food log as soon as possible:  What you ate. Be sure to include toppings, sauces, and other extras on the food.  How much you ate. This can be measured in cups, ounces, or number  of items.  How many calories were in each food and drink.  The total number of calories in the food you ate. Keep your food log near you, such as in a pocket-sized notebook or on an app or website on your mobile phone. Some programs will calculate calories for you and show you how many calories you have left to meet your daily goal. What are some portion-control tips?  Know how many calories are in a serving. This will help you know how many servings you can have of a certain food.  Use a measuring cup to measure serving sizes. You could also try weighing out portions on a kitchen scale. With time, you will be able to estimate serving sizes for some foods.  Take time to put servings of different foods on your  favorite plates or in your favorite bowls and cups so you know what a serving looks like.  Try not to eat straight from a food's packaging, such as from a bag or box. Eating straight from the package makes it hard to see how much you are eating and can lead to overeating. Put the amount you would like to eat in a cup or on a plate to make sure you are eating the right portion.  Use smaller plates, glasses, and bowls for smaller portions and to prevent overeating.  Try not to multitask. For example, avoid watching TV or using your computer while eating. If it is time to eat, sit down at a table and enjoy your food. This will help you recognize when you are full. It will also help you be more mindful of what and how much you are eating. What are tips for following this plan? Reading food labels  Check the calorie count compared with the serving size. The serving size may be smaller than what you are used to eating.  Check the source of the calories. Try to choose foods that are high in protein, fiber, and vitamins, and low in saturated fat, trans fat, and sodium. Shopping  Read nutrition labels while you shop. This will help you make healthy decisions about which foods to buy.  Pay attention to nutrition labels for low-fat or fat-free foods. These foods sometimes have the same number of calories or more calories than the full-fat versions. They also often have added sugar, starch, or salt to make up for flavor that was removed with the fat.  Make a grocery list of lower-calorie foods and stick to it. Cooking  Try to cook your favorite foods in a healthier way. For example, try baking instead of frying.  Use low-fat dairy products. Meal planning  Use more fruits and vegetables. One-half of your plate should be fruits and vegetables.  Include lean proteins, such as chicken, [place], and fish. Lifestyle Each week, aim to do one of the following:  150 minutes of moderate exercise, such as  walking.  75 minutes of vigorous exercise, such as running. General information  Know how many calories are in the foods you eat most often. This will help you calculate calorie counts faster.  Find a way of tracking calories that works for you. Get creative. Try different apps or programs if writing down calories does not work for you. What foods should I eat?  Eat nutritious foods. It is better to have a nutritious, high-calorie food, such as an avocado, than a food with few nutrients, such as a bag of potato chips.  Use your calories on foods and  drinks that will fill you up and will not leave you hungry soon after eating. ? Examples of foods that fill you up are nuts and nut butters, vegetables, lean proteins, and high-fiber foods such as whole grains. High-fiber foods are foods with more than 5 g of fiber per serving.  Pay attention to calories in drinks. Low-calorie drinks include water and unsweetened drinks. The items listed above may not be a complete list of foods and beverages you can eat. Contact a dietitian for more information.   What foods should I limit? Limit foods or drinks that are not good sources of vitamins, minerals, or protein or that are high in unhealthy fats. These include:  Candy.  Other sweets.  Sodas, specialty coffee drinks, alcohol, and juice. The items listed above may not be a complete list of foods and beverages you should avoid. Contact a dietitian for more information. How do I count calories when eating out?  Pay attention to portions. Often, portions are much larger when eating out. Try these tips to keep portions smaller: ? Consider sharing a meal instead of getting your own. ? If you get your own meal, eat only half of it. Before you start eating, ask for a container and put half of your meal into it. ? When available, consider ordering smaller portions from the menu instead of full portions.  Pay attention to your food and drink choices.  Knowing the way food is cooked and what is included with the meal can help you eat fewer calories. ? If calories are listed on the menu, choose the lower-calorie options. ? Choose dishes that include vegetables, fruits, whole grains, low-fat dairy products, and lean proteins. ? Choose items that are boiled, broiled, grilled, or steamed. Avoid items that are buttered, battered, fried, or served with cream sauce. Items labeled as crispy are usually fried, unless stated otherwise. ? Choose water, low-fat milk, unsweetened iced tea, or other drinks without added sugar. If you want an alcoholic beverage, choose a lower-calorie option, such as a glass of wine or light beer. ? Ask for dressings, sauces, and syrups on the side. These are usually high in calories, so you should limit the amount you eat. ? If you want a salad, choose a garden salad and ask for grilled meats. Avoid extra toppings such as bacon, cheese, or fried items. Ask for the dressing on the side, or ask for olive oil and vinegar or lemon to use as dressing.  Estimate how many servings of a food you are given. Knowing serving sizes will help you be aware of how much food you are eating at restaurants. Where to find more information  Centers for Disease Control and Prevention: http://www.wolf.info/  [place]. Department of Agriculture: http://www.wilson-mendoza.org/ Summary  Calorie counting means keeping track of how many calories you eat and drink each day. If you eat fewer calories than your body needs, you should lose weight.  A healthy amount of weight to lose per week is usually 1-2 lb (0.5-0.9 kg). This usually means reducing your daily calorie intake by 500-750 calories.  The number of calories in a food can be found on a Nutrition Facts label. If a food does not have a Nutrition Facts label, try to look up the calories online or ask your dietitian for help.  Use smaller plates, glasses, and bowls for smaller portions and to prevent overeating.  Use your  calories on foods and drinks that will fill you up and not leave you hungry  shortly after a meal. This information is not intended to replace advice given to you by your health care provider. Make sure you discuss any questions you have with your health care provider. Document Revised: 08/22/2019 Document Reviewed: 08/22/2019 Elsevier Patient Education  2021 [employer].

## 2020-11-11 NOTE — Assessment & Plan Note (Signed)
Controlled, no change in medication DASH diet and commitment to daily physical activity for a minimum of 30 minutes discussed and encouraged, as a part of hypertension management. The importance of attaining a healthy weight is also discussed.  BP/Weight 11/11/2020 08/06/2020 05/18/2020 05/15/2020 05/12/2020 05/08/2020 99/69/2493  Systolic BP 241 991 444 584 835 075 -  Diastolic BP 70 86 77 84 78 82 -  Wt. (Lbs) 230 228 225 235 234.79 234.79 -  BMI 34.97 34.67 34.21 35.73 35.7 - 35.7

## 2020-11-11 NOTE — Assessment & Plan Note (Addendum)
Deteriorate, trial of saxendA Patient re-educated about  the importance of commitment to a  minimum of 150 minutes of exercise per week as able.  The importance of healthy food choices with portion control discussed, as well as eating regularly and within a 12 hour window most days. The need to choose "clean , green" food 50 to 75% of the time is discussed, as well as to make water the primary drink and set a goal of 64 ounces water daily.    Weight /BMI 11/11/2020 08/06/2020 05/18/2020  WEIGHT 230 lb 228 lb 225 lb  HEIGHT 5\' 8"  - 5\' 8"   BMI 34.97 kg/m2 34.67 kg/m2 34.21 kg/m2    Start victoza , f/u in 12 weeks

## 2020-11-17 ENCOUNTER — Encounter: Payer: Self-pay | Admitting: Family Medicine

## 2020-11-17 ENCOUNTER — Other Ambulatory Visit: Payer: Self-pay

## 2020-11-17 ENCOUNTER — Other Ambulatory Visit (HOSPITAL_COMMUNITY): Payer: Self-pay

## 2020-11-17 ENCOUNTER — Encounter: Payer: Self-pay | Admitting: Orthopaedic Surgery

## 2020-11-17 ENCOUNTER — Other Ambulatory Visit: Payer: Self-pay | Admitting: Family Medicine

## 2020-11-17 NOTE — Telephone Encounter (Signed)
Yes please have her come in for an appointment and cam boot.

## 2020-11-18 ENCOUNTER — Other Ambulatory Visit (HOSPITAL_COMMUNITY): Payer: Self-pay

## 2020-11-18 ENCOUNTER — Other Ambulatory Visit (HOSPITAL_COMMUNITY): Payer: Self-pay | Admitting: Family Medicine

## 2020-11-18 ENCOUNTER — Telehealth: Payer: Self-pay

## 2020-11-18 DIAGNOSIS — Z1231 Encounter for screening mammogram for malignant neoplasm of breast: Secondary | ICD-10-CM

## 2020-11-18 NOTE — Telephone Encounter (Signed)
Yes

## 2020-11-18 NOTE — Telephone Encounter (Signed)
Patient said the victoza was too expensive and the pharmacist told her ozempic was $24, wants to know if she can try that one

## 2020-11-30 ENCOUNTER — Other Ambulatory Visit: Payer: Self-pay | Admitting: Family Medicine

## 2020-11-30 MED ORDER — OZEMPIC (2 MG/DOSE) 8 MG/3ML ~~LOC~~ SOPN
2.4000 mg | PEN_INJECTOR | SUBCUTANEOUS | 0 refills | Status: DC
  Filled 2020-11-30: qty 9, 84d supply, fill #0

## 2020-11-30 NOTE — Telephone Encounter (Signed)
Pls let her know sorry for the delay I prescribed 12 week supply of ozempic, needs in office appt in 11 weeks

## 2020-12-01 ENCOUNTER — Other Ambulatory Visit (HOSPITAL_COMMUNITY): Payer: Self-pay

## 2020-12-01 ENCOUNTER — Other Ambulatory Visit: Payer: Self-pay | Admitting: Family Medicine

## 2020-12-01 ENCOUNTER — Other Ambulatory Visit: Payer: Self-pay

## 2020-12-01 MED ORDER — OZEMPIC (2 MG/DOSE) 8 MG/3ML ~~LOC~~ SOPN
2.0000 mg | PEN_INJECTOR | SUBCUTANEOUS | 1 refills | Status: DC
  Filled 2020-12-01 (×2): qty 3, 28d supply, fill #0

## 2020-12-01 MED ORDER — OZEMPIC (2 MG/DOSE) 8 MG/3ML ~~LOC~~ SOPN
2.0000 mg | PEN_INJECTOR | SUBCUTANEOUS | 1 refills | Status: DC
  Filled 2020-12-01: qty 9, 84d supply, fill #0

## 2020-12-01 NOTE — Telephone Encounter (Signed)
Sent via mychart

## 2020-12-04 ENCOUNTER — Other Ambulatory Visit: Payer: Self-pay

## 2020-12-04 ENCOUNTER — Ambulatory Visit (HOSPITAL_COMMUNITY)
Admission: RE | Admit: 2020-12-04 | Discharge: 2020-12-04 | Disposition: A | Discharge status: Home / Self Care | Payer: 59 | Source: Ambulatory Visit | Attending: Family Medicine | Admitting: Family Medicine

## 2020-12-04 ENCOUNTER — Encounter: Payer: Self-pay | Admitting: Orthopaedic Surgery

## 2020-12-04 ENCOUNTER — Ambulatory Visit (HOSPITAL_COMMUNITY): Payer: 59

## 2020-12-04 ENCOUNTER — Other Ambulatory Visit (HOSPITAL_COMMUNITY): Payer: Self-pay

## 2020-12-04 ENCOUNTER — Ambulatory Visit (INDEPENDENT_AMBULATORY_CARE_PROVIDER_SITE_OTHER): Payer: 59 | Admitting: Orthopaedic Surgery

## 2020-12-04 DIAGNOSIS — M170 Bilateral primary osteoarthritis of knee: Secondary | ICD-10-CM

## 2020-12-04 DIAGNOSIS — M9261 Juvenile osteochondrosis of tarsus, right ankle: Secondary | ICD-10-CM

## 2020-12-04 DIAGNOSIS — M7661 Achilles tendinitis, right leg: Secondary | ICD-10-CM

## 2020-12-04 DIAGNOSIS — Z1231 Encounter for screening mammogram for malignant neoplasm of breast: Secondary | ICD-10-CM | POA: Insufficient documentation

## 2020-12-04 MED ORDER — LIDOCAINE HCL 1 % IJ SOLN
3.0000 mL | INTRAMUSCULAR | Status: AC | PRN
  Administered 2020-12-04: 3 mL

## 2020-12-04 MED ORDER — BUPIVACAINE HCL 0.25 % IJ SOLN
0.6600 mL | INTRAMUSCULAR | Status: AC | PRN
  Administered 2020-12-04: .66 mL via INTRA_ARTICULAR

## 2020-12-04 MED ORDER — METHYLPREDNISOLONE ACETATE 40 MG/ML IJ SUSP
13.3300 mg | INTRAMUSCULAR | Status: AC | PRN
  Administered 2020-12-04: 13.33 mg via INTRA_ARTICULAR

## 2020-12-04 MED ORDER — METHYLPREDNISOLONE ACETATE 40 MG/ML IJ SUSP
13.3300 mg | INTRAMUSCULAR | Status: AC | PRN
Start: 2020-12-04 — End: 2020-12-04
  Administered 2020-12-04: 13.33 mg via INTRA_ARTICULAR

## 2020-12-04 NOTE — Progress Notes (Signed)
Office Visit Note   Patient: Kristine Lawson           Date of Birth: August 12, 1970           MRN: [id number] Visit Date: 12/04/2020              Requested by: Fayrene Helper, MD [address], [place],  [place] [address] PCP: Fayrene Helper, MD   Assessment & Plan: Visit Diagnoses:  1. Bilateral primary osteoarthritis of knee   2. Haglund's deformity of right heel   3. Achilles tendinitis, right leg     Plan: Impression is bilateral knee chondromalacia patella and right heel Haglund's deformity/Achilles tendinosis.  In regards to her knees, she would like cortisone injections today for which we will perform.  We will also provide her with quadricep strengthening exercises.  In regards to her right heel, she is agreed to go into a boot at this time.  She will continue topical anti-inflammatories and Achilles stretching.  If her symptoms have not improved over the next several months she will follow back with [place] as we have discussed possible surgical intervention.  Call with concerns or questions in the meantime.  Follow-Up Instructions: Return if symptoms worsen or fail to improve.   Orders:  Orders Placed This Encounter  Procedures  . Large Joint Inj: bilateral knee   No orders of the defined types were placed in this encounter.     Procedures: Large Joint Inj: bilateral knee on 12/04/2020 8:31 AM Indications: pain Details: 22 G needle, anterolateral approach Medications (Right): 0.66 mL bupivacaine 0.25 %; 3 mL lidocaine 1 %; 13.33 mg methylPREDNISolone acetate 40 MG/ML Medications (Left): 0.66 mL bupivacaine 0.25 %; 3 mL lidocaine 1 %; 13.33 mg methylPREDNISolone acetate 40 MG/ML      Clinical Data: No additional findings.   Subjective: Chief Complaint  Patient presents with  . Left Knee - Pain  . Right Knee - Pain    HPI patient is a pleasant 50 year old female who comes in today with recurrent right heel pain as well as bilateral knee pain  right greater than left.  In regards to her heel, she has had pain to the retrocalcaneal area for over a year.  She was seen by [place] in January where she was offered a boot, stretches and topical anti-inflammatories.  She elected to only use these topical NSAIDs and perform the stretching.  Due to work constraints she elected to not go to physical therapy.  She notes that her symptoms have not really improved.  All of her pain is to the retrocalcaneal, distal Achilles area.  Worse with excessive walking as well as at night when she is trying to sleep.  Regards to her knees, history of chondromalacia patella.  Pain is primarily to the retropatellar aspect and is worse with stairs and knee flexion.  She was seen in our office several months ago her left knee cortisone injection was performed.  This significantly helped until recently.  Review of Systems as detailed in HPI.  All others reviewed and are negative.   Objective: Vital Signs: There were no vitals taken for this visit.  Physical Exam well-developed and well-nourished female in no acute distress.  Alert and oriented x3.  Ortho Exam bilateral knee exam shows moderate patellofemoral crepitus left greater than right.  Range of motion 0 to 125 degrees.  Medial and lateral joint line tenderness.  She is stable valgus varus stress.  She is neurovascular intact  distally.  Right heel exam reveals moderate tenderness over the retrocalcaneal and distal Achilles.  Dorsiflexion to about 30 degrees.  She is neurovascular intact distally.  Specialty Comments:  No specialty comments available.  Imaging: No new imaging   PMFS History: Patient Active Problem List   Diagnosis Date Noted  . Nephrolithiasis 06/01/2020  . Ureteral stone with hydronephrosis 05/09/2020  . Ureteral calculus 05/07/2020  . Allergic rhinitis 01/15/2020  . Genetic testing 12/16/2019  . Family history of breast cancer   . Family history of prostate cancer   . Family history of  cancer of gallbladder   . Family history of cervical cancer   . Family history of lung cancer   . Family history of throat cancer   . Tendinitis 11/13/2019  . Trapezius strain 11/13/2019  . Family history of cancer 10/28/2019  . Arthritis 06/05/2019  . 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 Muscle spasm 02/04/2018  . Goiter 10/07/2015  . S/P gastric bypass 02/10/2015  . Insomnia 04/05/2014  . Dyslipidemia 04/05/2014  . Metabolic syndrome X 75/04/2584  . Vitamin D deficiency 04/05/2014  . Psychosocial stressors 12/21/2012  . Obesity (BMI 30.0-34.9) 03/05/2011  . PES PLANUS 05/01/2008  . Acne vulgaris 09/28/2007  . HYPERTENSION, BENIGN ESSENTIAL 10/24/2006   Past Medical History:  Diagnosis Date  . Allergy   . Anemia    IDA  . Arthritis   . Arthritis   . Family history of breast cancer   . Family history of cancer of gallbladder   . Family history of cervical cancer   . Family history of lung cancer   . Family history of prostate cancer   . Family history of throat cancer   . GERD (gastroesophageal reflux disease)   . History of stomach ulcers   . Hypertension   . Insomnia    meds needed daily to achieve 6+ hours sleep.    Family History  Problem Relation Age of Onset  . Hypertension Mother   . Diabetes Mother   . Kidney disease Mother   . Hyperlipidemia Mother   . Colon cancer Mother 27       highly suspious of colon mass  . Breast cancer Mother 48  . Kidney disease Father   . Stroke Father   . Heart disease Father   . Hyperlipidemia Father   . Heart attack Father   . Diverticulitis Father   . Hypertension Brother   . Diabetes Brother   . Healthy Sister   . Other Daughter        hypertension  . Kidney disease Daughter   . Blindness Daughter   . Healthy Daughter   . Healthy Son   . Cancer Maternal Aunt 66       gallbladder cancer  . Breast cancer Paternal Aunt        dx. in her 8s  . Cervical cancer Maternal Grandmother        dx. in  her 2s  . Prostate cancer Maternal Grandfather        metastatic  . Heart attack Paternal Grandmother   . Hypertension Paternal Grandmother   . Stroke Paternal Grandmother   . Lung cancer Paternal Grandfather   . Throat cancer Paternal Grandfather   . Breast cancer Paternal Aunt        dx. in her 34s  . Breast cancer Paternal Aunt        dx. in her 48s  . Throat cancer Paternal Uncle   .  Lung cancer Paternal Uncle   . Breast cancer Other        dx. 97s or 60s (mother's first cousins x3)  . SIDS Maternal Aunt   . Prostate cancer Maternal Uncle        dx. in his 67s, not metastatic  . Prostate cancer Maternal Uncle        dx. in his 29s, not metastatic  . Breast cancer Cousin        dx. late 60s (paternal cousin)  . Breast cancer Other        dx. 40s/early 59s (maternal second cousins x2)    Past Surgical History:  Procedure Laterality Date  . ABLATION     uterine ablation  . APPENDECTOMY    . BIOPSY  12/18/2019   Procedure: BIOPSY;  Surgeon: Rogene Houston, MD;  [facility]: [facility];  Service: Endoscopy;;  gastric  . BREATH TEK H PYLORI N/A 11/18/2014   Procedure: BREATH TEK H PYLORI;  Surgeon: Greer Pickerel, MD;  Location: [facility];  Service: General;  Laterality: N/A;  . CHOLECYSTECTOMY    . COLONOSCOPY N/A 12/18/2019   Procedure: COLONOSCOPY;  Surgeon: Rogene Houston, MD;  Location: [facility];  Service: Endoscopy;  Laterality: N/A;  930  . CYSTOSCOPY W/ RETROGRADES Right 05/07/2020   Procedure: CYSTOSCOPY WITH RIGHT RETROGRADE PYELOGRAM;  Surgeon: Cleon Gustin, MD;  Location: [facility];  Service: Urology;  Laterality: Right;  . CYSTOSCOPY W/ URETERAL STENT PLACEMENT Right 05/07/2020   Procedure: CYSTOSCOPY WITH RIGHT URETERAL STENT REPLACEMENT;  Surgeon: Cleon Gustin, MD;  Location: [facility];  Service: Urology;  Laterality: Right;  . ESOPHAGOGASTRODUODENOSCOPY N/A 10/01/2018   Procedure: ESOPHAGOGASTRODUODENOSCOPY (EGD);  Surgeon: Rogene Houston, MD;   [facility]: [facility];  Service: Endoscopy;  Laterality: N/A;  . ESOPHAGOGASTRODUODENOSCOPY N/A 12/18/2019   Procedure: ESOPHAGOGASTRODUODENOSCOPY (EGD);  Surgeon: Rogene Houston, MD;  [facility];  Service: Endoscopy;  Laterality: N/A;  . EXTRACORPOREAL SHOCK WAVE LITHOTRIPSY Right 05/12/2020   Procedure: EXTRACORPOREAL SHOCK WAVE LITHOTRIPSY (ESWL);  Surgeon: Cleon Gustin, MD;  Location: [facility];  Service: Urology;  Laterality: Right;  . HERNIA REPAIR N/A    Phreesia 11/10/2020  . LAPAROSCOPIC ROUX-EN-Y GASTRIC BYPASS WITH HIATAL HERNIA REPAIR N/A 02/10/2015   Procedure: LAPAROSCOPIC ROUX-EN-Y GASTRIC BYPASS ;  Surgeon: Greer Pickerel, MD;  Location: [facility];  Service: General;  Laterality: N/A;  . TUBAL LIGATION    . UPPER GI ENDOSCOPY N/A 02/10/2015   Procedure: UPPER GI ENDOSCOPY;  Surgeon: Greer Pickerel, MD;  Location: [facility];  Service: General;  Laterality: N/A;   Social History   Occupational History  . Occupation: CARDIOLOGY    Employer: [employer]  . Occupation: Therapist, sports  Tobacco Use  . Smoking status: Former Smoker    Packs/day: 0.25    Years: 3.00    Pack years: 0.75    Quit date: 02/05/2007    Years since quitting: 13.8  . Smokeless tobacco: Never Used  Vaping Use  . Vaping Use: Never used  Substance and Sexual Activity  . Alcohol use: Yes    Comment: occassional wine 2-3 times a year  . Drug use: No  . Sexual activity: Yes    Birth control/protection: Surgical

## 2020-12-08 ENCOUNTER — Other Ambulatory Visit (HOSPITAL_COMMUNITY): Payer: Self-pay

## 2020-12-08 ENCOUNTER — Telehealth: Payer: Self-pay

## 2020-12-08 ENCOUNTER — Other Ambulatory Visit: Payer: Self-pay | Admitting: Internal Medicine

## 2020-12-08 DIAGNOSIS — E669 Obesity, unspecified: Secondary | ICD-10-CM

## 2020-12-08 DIAGNOSIS — R7303 Prediabetes: Secondary | ICD-10-CM

## 2020-12-08 MED ORDER — OZEMPIC (0.25 OR 0.5 MG/DOSE) 2 MG/1.5ML ~~LOC~~ SOPN
PEN_INJECTOR | SUBCUTANEOUS | 0 refills | Status: DC
  Filled 2020-12-08: qty 3, 70d supply, fill #0

## 2020-12-08 NOTE — Telephone Encounter (Signed)
Pt called and states that the wrong dose of Ozempic was sent in and she is not on the loading dose. States she needs correct dose sent to [facility].

## 2020-12-08 NOTE — Telephone Encounter (Signed)
Kristine Lawson states that this med is usually titrated from 0.25mg  for 4 weeks to 0.5mg  for 4 weeks then the maintenance dose is 1mg  once weekly. Dr Moshe Cipro sent it in for 2mg  to start. Can this be changed to the titration dose?

## 2020-12-08 NOTE — Telephone Encounter (Signed)
Sent a new prescription with changed dose.

## 2020-12-25 ENCOUNTER — Other Ambulatory Visit (HOSPITAL_COMMUNITY): Payer: Self-pay

## 2020-12-25 ENCOUNTER — Other Ambulatory Visit: Payer: Self-pay | Admitting: Family Medicine

## 2020-12-25 MED ORDER — TRAZODONE HCL 50 MG PO TABS
ORAL_TABLET | Freq: Every day | ORAL | 1 refills | Status: DC
  Filled 2020-12-25: qty 90, 90d supply, fill #0
  Filled 2021-10-04: qty 90, 90d supply, fill #1

## 2020-12-28 ENCOUNTER — Other Ambulatory Visit (HOSPITAL_COMMUNITY): Payer: Self-pay

## 2020-12-30 ENCOUNTER — Other Ambulatory Visit: Payer: Self-pay | Admitting: Family Medicine

## 2020-12-30 ENCOUNTER — Other Ambulatory Visit (HOSPITAL_COMMUNITY): Payer: Self-pay

## 2021-01-01 ENCOUNTER — Other Ambulatory Visit (HOSPITAL_COMMUNITY): Payer: Self-pay

## 2021-01-06 ENCOUNTER — Other Ambulatory Visit: Payer: Self-pay

## 2021-01-06 ENCOUNTER — Other Ambulatory Visit (HOSPITAL_COMMUNITY): Payer: Self-pay

## 2021-01-06 ENCOUNTER — Encounter: Payer: Self-pay | Admitting: Family Medicine

## 2021-01-06 DIAGNOSIS — B001 Herpesviral vesicular dermatitis: Secondary | ICD-10-CM

## 2021-01-06 DIAGNOSIS — K219 Gastro-esophageal reflux disease without esophagitis: Secondary | ICD-10-CM

## 2021-01-06 MED ORDER — PANTOPRAZOLE SODIUM 40 MG PO TBEC
40.0000 mg | DELAYED_RELEASE_TABLET | Freq: Two times a day (BID) | ORAL | 2 refills | Status: DC
  Filled 2021-01-06: qty 60, 30d supply, fill #0

## 2021-01-06 MED ORDER — ACYCLOVIR 400 MG PO TABS
400.0000 mg | ORAL_TABLET | ORAL | 1 refills | Status: DC | PRN
  Filled 2021-01-06: qty 30, 30d supply, fill #0

## 2021-01-09 ENCOUNTER — Other Ambulatory Visit (HOSPITAL_COMMUNITY): Payer: Self-pay

## 2021-01-26 ENCOUNTER — Other Ambulatory Visit (HOSPITAL_COMMUNITY): Payer: Self-pay

## 2021-01-26 ENCOUNTER — Encounter: Payer: Self-pay | Admitting: Family Medicine

## 2021-01-27 ENCOUNTER — Other Ambulatory Visit (HOSPITAL_COMMUNITY): Payer: Self-pay

## 2021-01-27 ENCOUNTER — Other Ambulatory Visit: Payer: Self-pay | Admitting: Family Medicine

## 2021-01-27 ENCOUNTER — Other Ambulatory Visit: Payer: Self-pay

## 2021-01-27 DIAGNOSIS — E559 Vitamin D deficiency, unspecified: Secondary | ICD-10-CM

## 2021-01-27 MED ORDER — ERGOCALCIFEROL 1.25 MG (50000 UT) PO CAPS
50000.0000 [IU] | ORAL_CAPSULE | ORAL | 1 refills | Status: DC
  Filled 2021-01-27: qty 12, 84d supply, fill #0
  Filled 2021-06-19: qty 12, 84d supply, fill #1

## 2021-01-27 MED ORDER — OLMESARTAN MEDOXOMIL-HCTZ 20-12.5 MG PO TABS
1.0000 | ORAL_TABLET | Freq: Every day | ORAL | 3 refills | Status: DC
  Filled 2021-01-27: qty 90, 90d supply, fill #0
  Filled 2021-04-22: qty 90, 90d supply, fill #1
  Filled 2021-07-14: qty 90, 90d supply, fill #2
  Filled 2021-10-29: qty 90, 90d supply, fill #3

## 2021-01-28 ENCOUNTER — Other Ambulatory Visit: Payer: Self-pay | Admitting: Internal Medicine

## 2021-01-28 ENCOUNTER — Other Ambulatory Visit (HOSPITAL_COMMUNITY): Payer: Self-pay

## 2021-01-28 DIAGNOSIS — R7303 Prediabetes: Secondary | ICD-10-CM

## 2021-01-28 DIAGNOSIS — E669 Obesity, unspecified: Secondary | ICD-10-CM

## 2021-01-28 MED ORDER — OZEMPIC (0.25 OR 0.5 MG/DOSE) 2 MG/1.5ML ~~LOC~~ SOPN
PEN_INJECTOR | SUBCUTANEOUS | 0 refills | Status: DC
  Filled 2021-01-28: qty 6, 84d supply, fill #0

## 2021-02-02 ENCOUNTER — Encounter: Payer: Self-pay | Admitting: Genetic Counselor

## 2021-02-03 ENCOUNTER — Other Ambulatory Visit: Payer: Self-pay | Admitting: Family Medicine

## 2021-02-03 ENCOUNTER — Other Ambulatory Visit: Payer: Self-pay

## 2021-02-03 ENCOUNTER — Encounter: Payer: Self-pay | Admitting: Family Medicine

## 2021-02-03 ENCOUNTER — Other Ambulatory Visit (HOSPITAL_COMMUNITY): Payer: Self-pay

## 2021-02-03 ENCOUNTER — Telehealth: Payer: Self-pay

## 2021-02-03 DIAGNOSIS — R7303 Prediabetes: Secondary | ICD-10-CM

## 2021-02-03 DIAGNOSIS — E669 Obesity, unspecified: Secondary | ICD-10-CM

## 2021-02-03 MED ORDER — SEMAGLUTIDE (1 MG/DOSE) 4 MG/3ML ~~LOC~~ SOPN
1.0000 mg | PEN_INJECTOR | SUBCUTANEOUS | 2 refills | Status: DC
Start: 2021-02-03 — End: 2021-03-26
  Filled 2021-02-03: qty 3, 28d supply, fill #0
  Filled 2021-02-18 – 2021-03-23 (×4): qty 3, 28d supply, fill #1

## 2021-02-03 MED ORDER — OZEMPIC (0.25 OR 0.5 MG/DOSE) 2 MG/1.5ML ~~LOC~~ SOPN
PEN_INJECTOR | SUBCUTANEOUS | 0 refills | Status: DC
  Filled 2021-02-03: qty 6, 84d supply, fill #0
  Filled 2021-03-23: qty 1.5, 42d supply, fill #0

## 2021-02-03 NOTE — Telephone Encounter (Signed)
Estill Bamberg from [facility] called asking if Ozempic 0.25/0.5 mg can be changed 1 mg injection patient is using up to 1 mg now.   Estill Bamberg call back # [phone]

## 2021-02-04 NOTE — Telephone Encounter (Signed)
Dose increase sent in

## 2021-02-05 ENCOUNTER — Other Ambulatory Visit (HOSPITAL_COMMUNITY): Payer: Self-pay

## 2021-02-10 ENCOUNTER — Ambulatory Visit: Payer: 59 | Admitting: Family Medicine

## 2021-02-18 ENCOUNTER — Other Ambulatory Visit: Payer: Self-pay | Admitting: Family Medicine

## 2021-02-18 ENCOUNTER — Other Ambulatory Visit (HOSPITAL_COMMUNITY): Payer: Self-pay

## 2021-02-18 MED FILL — Fluticasone Propionate Nasal Exhaler Susp 93 MCG/ACT: NASAL | 30 days supply | Qty: 16 | Fill #0 | Status: AC

## 2021-02-19 ENCOUNTER — Other Ambulatory Visit (HOSPITAL_COMMUNITY): Payer: Self-pay

## 2021-02-22 ENCOUNTER — Ambulatory Visit: Payer: 59 | Admitting: Family Medicine

## 2021-02-25 ENCOUNTER — Other Ambulatory Visit: Payer: Self-pay | Admitting: Family Medicine

## 2021-02-25 ENCOUNTER — Other Ambulatory Visit (HOSPITAL_COMMUNITY): Payer: Self-pay

## 2021-02-25 ENCOUNTER — Encounter: Payer: Self-pay | Admitting: Family Medicine

## 2021-02-25 MED ORDER — SEMAGLUTIDE (2 MG/DOSE) 8 MG/3ML ~~LOC~~ SOPN
PEN_INJECTOR | SUBCUTANEOUS | 0 refills | Status: DC
  Filled 2021-02-25: qty 6, 84d supply, fill #0
  Filled 2021-02-25: qty 3, 28d supply, fill #0

## 2021-03-12 ENCOUNTER — Telehealth: Payer: Self-pay

## 2021-03-15 ENCOUNTER — Ambulatory Visit: Payer: 59 | Admitting: Family Medicine

## 2021-03-15 DIAGNOSIS — M5442 Lumbago with sciatica, left side: Secondary | ICD-10-CM | POA: Diagnosis not present

## 2021-03-15 DIAGNOSIS — Z01419 Encounter for gynecological examination (general) (routine) without abnormal findings: Secondary | ICD-10-CM | POA: Diagnosis not present

## 2021-03-15 DIAGNOSIS — Z9889 Other specified postprocedural states: Secondary | ICD-10-CM | POA: Diagnosis not present

## 2021-03-15 DIAGNOSIS — D259 Leiomyoma of uterus, unspecified: Secondary | ICD-10-CM | POA: Diagnosis not present

## 2021-03-23 ENCOUNTER — Other Ambulatory Visit (HOSPITAL_COMMUNITY): Payer: Self-pay

## 2021-03-24 ENCOUNTER — Other Ambulatory Visit (HOSPITAL_COMMUNITY): Payer: Self-pay

## 2021-03-24 ENCOUNTER — Other Ambulatory Visit: Payer: Self-pay | Admitting: Family Medicine

## 2021-03-24 MED ORDER — SEMAGLUTIDE (2 MG/DOSE) 8 MG/3ML ~~LOC~~ SOPN
PEN_INJECTOR | SUBCUTANEOUS | 0 refills | Status: DC
  Filled 2021-03-24 – 2021-04-22 (×3): qty 3, 28d supply, fill #0

## 2021-03-26 ENCOUNTER — Ambulatory Visit: Payer: 59 | Admitting: Family Medicine

## 2021-03-26 ENCOUNTER — Other Ambulatory Visit (HOSPITAL_COMMUNITY): Payer: Self-pay

## 2021-03-26 ENCOUNTER — Other Ambulatory Visit: Payer: Self-pay

## 2021-03-26 ENCOUNTER — Encounter: Payer: Self-pay | Admitting: Family Medicine

## 2021-03-26 VITALS — BP 115/70 | HR 76 | Temp 98.4°F | Resp 18 | Ht 68.0 in | Wt 223.0 lb

## 2021-03-26 DIAGNOSIS — E669 Obesity, unspecified: Secondary | ICD-10-CM

## 2021-03-26 DIAGNOSIS — I10 Essential (primary) hypertension: Secondary | ICD-10-CM

## 2021-03-26 DIAGNOSIS — K259 Gastric ulcer, unspecified as acute or chronic, without hemorrhage or perforation: Secondary | ICD-10-CM

## 2021-03-26 DIAGNOSIS — L700 Acne vulgaris: Secondary | ICD-10-CM | POA: Diagnosis not present

## 2021-03-26 DIAGNOSIS — B001 Herpesviral vesicular dermatitis: Secondary | ICD-10-CM | POA: Diagnosis not present

## 2021-03-26 DIAGNOSIS — E559 Vitamin D deficiency, unspecified: Secondary | ICD-10-CM | POA: Diagnosis not present

## 2021-03-26 DIAGNOSIS — M779 Enthesopathy, unspecified: Secondary | ICD-10-CM

## 2021-03-26 DIAGNOSIS — G8929 Other chronic pain: Secondary | ICD-10-CM

## 2021-03-26 DIAGNOSIS — E538 Deficiency of other specified B group vitamins: Secondary | ICD-10-CM

## 2021-03-26 DIAGNOSIS — M5442 Lumbago with sciatica, left side: Secondary | ICD-10-CM

## 2021-03-26 DIAGNOSIS — F5104 Psychophysiologic insomnia: Secondary | ICD-10-CM | POA: Diagnosis not present

## 2021-03-26 DIAGNOSIS — E785 Hyperlipidemia, unspecified: Secondary | ICD-10-CM | POA: Diagnosis not present

## 2021-03-26 DIAGNOSIS — T50905A Adverse effect of unspecified drugs, medicaments and biological substances, initial encounter: Secondary | ICD-10-CM

## 2021-03-26 DIAGNOSIS — Z658 Other specified problems related to psychosocial circumstances: Secondary | ICD-10-CM

## 2021-03-26 MED ORDER — PREDNISONE 10 MG PO TABS: 10.0000 mg | ORAL_TABLET | Freq: Two times a day (BID) | ORAL | 0 refills | Status: DC

## 2021-03-26 MED ORDER — ACYCLOVIR 400 MG PO TABS
400.0000 mg | ORAL_TABLET | Freq: Every day | ORAL | 3 refills | Status: DC
  Filled 2021-03-26: qty 90, 18d supply, fill #0

## 2021-03-26 NOTE — Progress Notes (Signed)
Kristine Lawson     MRN: [id number]      DOB: 1971/02/08   HPI Ms. Cardell is here for follow up and re-evaluation of chronic medical conditions, medication management and review of any available recent lab and radiology data.  4 week h/o LBP radiaiting down lefft leg tp o toes, at it's worse it is a 6 , uncomfortable lying, standing and sitting, has to reposition  herself, notices weakness and  poor sensation when walking at times, no incontinence Needs acyclovir daily Face is responding to spironolactone but intolerant of full dose, will message when she needs  ROS Denies recent fever or chills. Denies sinus pressure, nasal congestion, ear pain or sore throat. Denies chest congestion, productive cough or wheezing. Denies chest pains, palpitations and leg swelling Denies abdominal pain, nausea, vomiting,diarrhea or constipation.   Denies dysuria, frequency, hesitancy or incontinence. Denies uncontrolled depression, anxiety or insomnia. Denies skin break down or rash.Relies on adaily acycloir and spironolactone for cold sores and facial acne   PE  BP 115/70 (BP Location: Right Arm, Patient Position: Sitting, Cuff Size: Large)   Pulse 76   Temp 98.4 F (36.9 C)   Resp 18   Ht '5\' 8"'$  (1.727 m)   Wt 223 lb (101.2 kg)   SpO2 95%   BMI 33.91 kg/m   Patient alert and oriented and in no cardiopulmonary distress.  HEENT: No facial asymmetry, EOMI,     Neck supple .  Chest: Clear to auscultation bilaterally.  CVS: S1, S2 no murmurs, no S3.Regular rate.  ABD: Soft non tender.   Ext: No edema  MS: decreased  ROM  lumbar spine, adequate in shoulders, hips and knees.  Skin: Intact, no ulcerations or rash noted.  Psych: Good eye contact, normal affect. Memory intact not anxious or depressed appearing.  CNS: CN 2-12 intact, grade 4 power in LLE and reduced sensation in LLE, otherwise   normal throughout.no focal deficits noted.   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 Pt applauded on succesful weight loss through lifestyle change, and encouraged to continue same. Weight loss goal set for the next several months. Continue ozempic and current dose and re eval in 3 months  Insomnia Controlled, no change in medication , uses on avg 1 tab/ night  HYPERTENSION, BENIGN ESSENTIAL Controlled, no change in medication DASH diet and commitment to daily physical activity for a minimum of 30 minutes discussed and encouraged, as a part of hypertension management. The importance of attaining a healthy weight is also discussed.  BP/Weight 03/26/2021 11/11/2020 08/06/2020 05/18/2020 05/15/2020 05/12/2020 [id number]  Systolic BP [id number]  Diastolic BP 70 70 86 77 84 78 82  Wt. 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

## 2021-03-26 NOTE — Patient Instructions (Addendum)
F/U mid to end December, call if you need me sooner  Short course of prednisone is prescribed  for your sciatic pain, and new MRI is needed.  Acyclovir daily for 3 months each time  CBC, fasting lipid, cmp and EGFR, TSH vit D and B12 end September.  Think about what you will eat, plan ahead. Choose " clean, green, fresh or frozen" over canned, processed or packaged foods which are more sugary, salty and fatty. 70 to 75% of food eaten should be vegetables and fruit. Three meals at set times with snacks allowed between meals, but they must be fruit or vegetables. Aim to eat over a 12 hour period , example 7 am to 7 pm, and STOP after  your last meal of the day. Drink water,generally about 64 ounces per day, no other drink is as healthy. Fruit juice is best enjoyed in a healthy way, by EATING the fruit.  Thanks for choosing [facility], we consider it a privelige to serve you.   Marland Kitchen

## 2021-03-28 ENCOUNTER — Encounter: Payer: Self-pay | Admitting: Family Medicine

## 2021-03-28 DIAGNOSIS — B001 Herpesviral vesicular dermatitis: Secondary | ICD-10-CM | POA: Insufficient documentation

## 2021-03-28 DIAGNOSIS — M5442 Lumbago with sciatica, left side: Secondary | ICD-10-CM | POA: Insufficient documentation

## 2021-03-28 MED ORDER — ACYCLOVIR 400 MG PO TABS
ORAL_TABLET | ORAL | 3 refills | Status: DC
  Filled 2021-03-28: qty 90, fill #0
  Filled 2021-06-29: qty 90, 90d supply, fill #0
  Filled 2021-10-07: qty 90, 90d supply, fill #1
  Filled 2021-12-31: qty 90, 90d supply, fill #2

## 2021-03-28 NOTE — Assessment & Plan Note (Signed)
Persistent right achilles tendinitis limiting mobility and ability to exercise

## 2021-03-28 NOTE — Assessment & Plan Note (Signed)
Tolerates HALF spironolactone daily with good result, 1 tab causes cramping

## 2021-03-28 NOTE — Assessment & Plan Note (Addendum)
Limited exposure to NSAID and prednisone only, continue daily PPI, high dose

## 2021-03-28 NOTE — Assessment & Plan Note (Signed)
Controlled, no change in medication DASH diet and commitment to daily physical activity for a minimum of 30 minutes discussed and encouraged, as a part of hypertension management. The importance of attaining a healthy weight is also discussed.  BP/Weight 03/26/2021 11/11/2020 08/06/2020 05/18/2020 05/15/2020 05/12/2020 [id number]  Systolic BP [id number]  Diastolic BP 70 70 86 77 84 78 82  Wt. (Lbs) 223 230 228 225 235 234.79 234.79  BMI 33.91 34.97 34.67 34.21 35.73 35.7 -

## 2021-03-28 NOTE — Assessment & Plan Note (Signed)
Improved. Pt applauded on succesful weight loss through lifestyle change, and encouraged to continue same. Weight loss goal set for the next several months. Continue ozempic and current dose and re eval in 3 months

## 2021-03-28 NOTE — Assessment & Plan Note (Signed)
Dealing with unexpected loss of her first childwho had multiple chronic illnesses, states has great support from friends and no need for therapy currently

## 2021-03-28 NOTE — Assessment & Plan Note (Signed)
Controlled with  once daily acyclovir

## 2021-03-28 NOTE — Assessment & Plan Note (Signed)
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

## 2021-03-28 NOTE — Assessment & Plan Note (Signed)
Controlled, no change in medication , uses on avg 1 tab/ night

## 2021-03-29 ENCOUNTER — Encounter: Payer: Self-pay | Admitting: Family Medicine

## 2021-03-29 ENCOUNTER — Other Ambulatory Visit (HOSPITAL_COMMUNITY): Payer: Self-pay

## 2021-03-29 MED ORDER — SPIRONOLACTONE 25 MG PO TABS
ORAL_TABLET | ORAL | 3 refills | Status: DC
  Filled 2021-03-29: qty 45, 90d supply, fill #0

## 2021-03-29 MED ORDER — HYDROXYZINE PAMOATE 50 MG PO CAPS
ORAL_CAPSULE | ORAL | 3 refills | Status: DC
  Filled 2021-03-29: qty 90, 90d supply, fill #0
  Filled 2021-07-14: qty 90, 90d supply, fill #1
  Filled 2021-12-31: qty 90, 90d supply, fill #2

## 2021-03-29 NOTE — Assessment & Plan Note (Signed)
Updated lab needed at/ before next visit.   

## 2021-03-30 ENCOUNTER — Other Ambulatory Visit (HOSPITAL_COMMUNITY): Payer: Self-pay

## 2021-04-06 ENCOUNTER — Ambulatory Visit (HOSPITAL_COMMUNITY)
Admission: RE | Admit: 2021-04-06 | Discharge: 2021-04-06 | Disposition: A | Discharge status: Home / Self Care | Payer: 59 | Source: Ambulatory Visit | Attending: Family Medicine | Admitting: Family Medicine

## 2021-04-06 ENCOUNTER — Other Ambulatory Visit: Payer: Self-pay

## 2021-04-06 DIAGNOSIS — G8929 Other chronic pain: Secondary | ICD-10-CM | POA: Diagnosis not present

## 2021-04-06 DIAGNOSIS — M5442 Lumbago with sciatica, left side: Secondary | ICD-10-CM | POA: Diagnosis not present

## 2021-04-06 DIAGNOSIS — M545 Low back pain, unspecified: Secondary | ICD-10-CM | POA: Diagnosis not present

## 2021-04-07 ENCOUNTER — Encounter: Payer: Self-pay | Admitting: Family Medicine

## 2021-04-08 ENCOUNTER — Other Ambulatory Visit: Payer: Self-pay | Admitting: Family Medicine

## 2021-04-08 DIAGNOSIS — M5410 Radiculopathy, site unspecified: Secondary | ICD-10-CM

## 2021-04-20 NOTE — Telephone Encounter (Signed)
Kristine Lawson, please advise.

## 2021-04-22 ENCOUNTER — Other Ambulatory Visit (HOSPITAL_COMMUNITY): Payer: Self-pay

## 2021-04-22 ENCOUNTER — Other Ambulatory Visit: Payer: Self-pay | Admitting: Family Medicine

## 2021-04-22 DIAGNOSIS — K219 Gastro-esophageal reflux disease without esophagitis: Secondary | ICD-10-CM

## 2021-04-22 MED FILL — Fluticasone Propionate Nasal Exhaler Susp 93 MCG/ACT: NASAL | 30 days supply | Qty: 16 | Fill #1 | Status: AC

## 2021-04-23 ENCOUNTER — Other Ambulatory Visit (HOSPITAL_COMMUNITY): Payer: Self-pay

## 2021-04-23 DIAGNOSIS — E538 Deficiency of other specified B group vitamins: Secondary | ICD-10-CM | POA: Diagnosis not present

## 2021-04-23 DIAGNOSIS — E559 Vitamin D deficiency, unspecified: Secondary | ICD-10-CM | POA: Diagnosis not present

## 2021-04-23 DIAGNOSIS — E785 Hyperlipidemia, unspecified: Secondary | ICD-10-CM | POA: Diagnosis not present

## 2021-04-23 DIAGNOSIS — I10 Essential (primary) hypertension: Secondary | ICD-10-CM | POA: Diagnosis not present

## 2021-04-23 MED ORDER — PANTOPRAZOLE SODIUM 40 MG PO TBEC
40.0000 mg | DELAYED_RELEASE_TABLET | Freq: Two times a day (BID) | ORAL | 2 refills | Status: DC
  Filled 2021-04-23: qty 180, 90d supply, fill #0

## 2021-04-24 LAB — CBC
Hematocrit: 41.2 % (ref 34.0–46.6)
Hemoglobin: 13.4 g/dL (ref 11.1–15.9)
MCH: 28.9 pg (ref 26.6–33.0)
MCHC: 32.5 g/dL (ref 31.5–35.7)
MCV: 89 fL (ref 79–97)
Platelets: 360 10*3/uL (ref 150–450)
RBC: 4.64 x10E6/uL (ref 3.77–5.28)
RDW: 12.5 % (ref 11.7–15.4)
WBC: 4.3 10*3/uL (ref 3.4–10.8)

## 2021-04-24 LAB — LIPID PANEL
Chol/HDL Ratio: 3 ratio (ref 0.0–4.4)
Cholesterol, Total: 165 mg/dL (ref 100–199)
HDL: 55 mg/dL (ref >39)
LDL Chol Calc (NIH): 98 mg/dL (ref 0–99)
Triglycerides: 61 mg/dL (ref 0–149)
VLDL Cholesterol Cal: 12 mg/dL (ref 5–40)

## 2021-04-24 LAB — CMP14+EGFR
ALT: 18 IU/L (ref 0–32)
AST: 19 IU/L (ref 0–40)
Albumin/Globulin Ratio: 1.5 (ref 1.2–2.2)
Albumin: 4.3 g/dL (ref 3.8–4.8)
Alkaline Phosphatase: 53 IU/L (ref 44–121)
BUN/Creatinine Ratio: 12 (ref 9–23)
BUN: 10 mg/dL (ref 6–24)
Bilirubin Total: 0.6 mg/dL (ref 0.0–1.2)
CO2: 26 mmol/L (ref 20–29)
Calcium: 10.3 mg/dL — ABNORMAL HIGH (ref 8.7–10.2)
Chloride: 99 mmol/L (ref 96–106)
Creatinine, Ser: 0.83 mg/dL (ref 0.57–1.00)
Globulin, Total: 2.8 g/dL (ref 1.5–4.5)
Glucose: 80 mg/dL (ref 70–99)
Potassium: 5 mmol/L (ref 3.5–5.2)
Sodium: 139 mmol/L (ref 134–144)
Total Protein: 7.1 g/dL (ref 6.0–8.5)
eGFR: 86 mL/min/{1.73_m2} (ref >59)

## 2021-04-24 LAB — TSH: TSH: 0.485 u[IU]/mL (ref 0.450–4.500)

## 2021-04-24 LAB — VITAMIN D 25 HYDROXY (VIT D DEFICIENCY, FRACTURES): Vit D, 25-Hydroxy: 48.5 ng/mL (ref 30.0–100.0)

## 2021-04-24 LAB — VITAMIN B12: Vitamin B-12: 1375 pg/mL — ABNORMAL HIGH (ref 232–1245)

## 2021-04-26 ENCOUNTER — Other Ambulatory Visit (HOSPITAL_COMMUNITY): Payer: Self-pay

## 2021-05-25 ENCOUNTER — Other Ambulatory Visit: Payer: Self-pay | Admitting: Family Medicine

## 2021-05-25 ENCOUNTER — Other Ambulatory Visit (HOSPITAL_COMMUNITY): Payer: Self-pay

## 2021-05-25 MED ORDER — OZEMPIC (2 MG/DOSE) 8 MG/3ML ~~LOC~~ SOPN
PEN_INJECTOR | SUBCUTANEOUS | 0 refills | Status: DC
  Filled 2021-05-25: qty 3, 28d supply, fill #0

## 2021-05-31 ENCOUNTER — Ambulatory Visit: Payer: 59 | Admitting: Internal Medicine

## 2021-05-31 ENCOUNTER — Other Ambulatory Visit (HOSPITAL_COMMUNITY): Payer: Self-pay

## 2021-05-31 DIAGNOSIS — M47816 Spondylosis without myelopathy or radiculopathy, lumbar region: Secondary | ICD-10-CM | POA: Diagnosis not present

## 2021-05-31 DIAGNOSIS — M5416 Radiculopathy, lumbar region: Secondary | ICD-10-CM | POA: Diagnosis not present

## 2021-05-31 DIAGNOSIS — M48061 Spinal stenosis, lumbar region without neurogenic claudication: Secondary | ICD-10-CM | POA: Diagnosis not present

## 2021-05-31 MED ORDER — GABAPENTIN 100 MG PO CAPS
ORAL_CAPSULE | ORAL | 0 refills | Status: DC
  Filled 2021-05-31: qty 270, 90d supply, fill #0

## 2021-06-01 ENCOUNTER — Other Ambulatory Visit (HOSPITAL_COMMUNITY): Payer: Self-pay

## 2021-06-02 ENCOUNTER — Other Ambulatory Visit (HOSPITAL_COMMUNITY): Payer: Self-pay

## 2021-06-02 MED ORDER — DICLOFENAC SODIUM 1 % EX GEL
CUTANEOUS | 0 refills | Status: AC
  Filled 2021-06-02: qty 100, 30d supply, fill #0

## 2021-06-07 ENCOUNTER — Other Ambulatory Visit: Payer: Self-pay

## 2021-06-07 ENCOUNTER — Other Ambulatory Visit: Payer: Self-pay | Admitting: Internal Medicine

## 2021-06-07 ENCOUNTER — Other Ambulatory Visit (HOSPITAL_COMMUNITY): Payer: Self-pay

## 2021-06-07 ENCOUNTER — Ambulatory Visit: Payer: 59 | Admitting: Internal Medicine

## 2021-06-07 ENCOUNTER — Encounter: Payer: Self-pay | Admitting: Internal Medicine

## 2021-06-07 VITALS — BP 124/81 | HR 74 | Resp 18 | Ht 68.0 in | Wt 219.0 lb

## 2021-06-07 DIAGNOSIS — J309 Allergic rhinitis, unspecified: Secondary | ICD-10-CM | POA: Diagnosis not present

## 2021-06-07 DIAGNOSIS — H1013 Acute atopic conjunctivitis, bilateral: Secondary | ICD-10-CM

## 2021-06-07 MED ORDER — LEVOCETIRIZINE DIHYDROCHLORIDE 5 MG PO TABS
5.0000 mg | ORAL_TABLET | Freq: Every evening | ORAL | 5 refills | Status: DC
  Filled 2021-06-07: qty 30, 30d supply, fill #0
  Filled 2021-06-29: qty 90, 90d supply, fill #1

## 2021-06-07 MED ORDER — CYCLOSPORINE 0.05 % OP EMUL
1.0000 [drp] | Freq: Two times a day (BID) | OPHTHALMIC | 2 refills | Status: DC
  Filled 2021-06-07: qty 24, 240d supply, fill #0
  Filled 2021-06-09: qty 5.5, 30d supply, fill #0

## 2021-06-07 NOTE — Progress Notes (Signed)
Acute Office Visit  Subjective:    Patient ID: Kristine Lawson, female    DOB: 09-17-1970, 50 y.o.   MRN: [id number]  Chief Complaint  Patient presents with   Dry Eye    Pt has had dry eyes for a few years they are red dry and have used sustained eye drops also ear itch and feels pressure in both ears for about 2 years     HPI Patient is in today for c/o dry eyes, which is chronic. She has been given Lumigan eye drops by her Optometrist, which has not been helpful. She has used other OTC artificial eye drops as well with minimal relief. Denies any eye redness or discharge currently. Does have chronic itching/irritation as well. She has tried Pataday eye drops for itching in the past.  She c/o b/l ear fullness, but ear exam did not show impacted cerumen. She does report frequent nose blows due to chronic nasal congestion.  Past Medical History:  Diagnosis Date   Allergy    Anemia    IDA   Arthritis    Arthritis    Family history of breast cancer    Family history of cancer of gallbladder    Family history of cervical cancer    Family history of lung cancer    Family history of prostate cancer    Family history of throat cancer    GERD (gastroesophageal reflux disease)    History of stomach ulcers    Hypertension    Insomnia    meds needed daily to achieve 6+ hours sleep.   Ureteral stone with hydronephrosis 05/09/2020    Past Surgical History:  Procedure Laterality Date   ABLATION     uterine ablation   APPENDECTOMY     BIOPSY  12/18/2019   Procedure: BIOPSY;  Surgeon: Rogene Houston, MD;  [facility];  Service: Endoscopy;;  gastric   BREATH TEK H PYLORI N/A 11/18/2014   Procedure: BREATH TEK Kandis Ban;  Surgeon: Greer Pickerel, MD;  Location: [facility];  Service: General;  Laterality: N/A;   CHOLECYSTECTOMY     COLONOSCOPY N/A 12/18/2019   Procedure: COLONOSCOPY;  Surgeon: Rogene Houston, MD;  [facility];  Service: Endoscopy;  Laterality:  N/A;  [place] Right 05/07/2020   Procedure: CYSTOSCOPY WITH RIGHT RETROGRADE PYELOGRAM;  Surgeon: Cleon Gustin, MD;  Location: [facility];  Service: Urology;  Laterality: Right;   CYSTOSCOPY W/ URETERAL STENT PLACEMENT Right 05/07/2020   Procedure: CYSTOSCOPY WITH RIGHT URETERAL STENT REPLACEMENT;  Surgeon: Cleon Gustin, MD;  [facility];  Service: Urology;  Laterality: Right;   ESOPHAGOGASTRODUODENOSCOPY N/A 10/01/2018   Procedure: ESOPHAGOGASTRODUODENOSCOPY (EGD);  Surgeon: Rogene Houston, MD;  [facility];  Service: Endoscopy;  Laterality: N/A;   ESOPHAGOGASTRODUODENOSCOPY N/A 12/18/2019   Procedure: ESOPHAGOGASTRODUODENOSCOPY (EGD);  Surgeon: Rogene Houston, MD;  [facility];  Service: Endoscopy;  Laterality: N/A;   EXTRACORPOREAL SHOCK WAVE LITHOTRIPSY Right 05/12/2020   Procedure: EXTRACORPOREAL SHOCK WAVE LITHOTRIPSY (ESWL);  Surgeon: Cleon Gustin, MD;  [facility];  Service: Urology;  Laterality: Right;   HERNIA REPAIR N/A    Phreesia 11/10/2020   LAPAROSCOPIC ROUX-EN-Y GASTRIC BYPASS WITH HIATAL HERNIA REPAIR N/A 02/10/2015   Procedure: LAPAROSCOPIC ROUX-EN-Y GASTRIC BYPASS ;  Surgeon: Greer Pickerel, MD;  [facility];  Service: General;  Laterality: N/A;   TUBAL LIGATION     UPPER GI ENDOSCOPY N/A  02/10/2015   Procedure: UPPER GI ENDOSCOPY;  Surgeon: Greer Pickerel, MD;  [facility];  Service: General;  Laterality: N/A;    Family History  Problem Relation Age of Onset   Hypertension Mother    Diabetes Mother    Kidney disease Mother    Hyperlipidemia Mother    Colon cancer Mother 4       highly suspious of colon mass   Breast cancer Mother 57   Kidney disease Father    Stroke Father    Heart disease Father    Hyperlipidemia Father    Heart attack Father    Diverticulitis Father    Hypertension Brother    Diabetes Brother    Healthy Sister    Other Daughter        hypertension   Kidney  disease Daughter    Blindness Daughter    Healthy Daughter    Healthy Son    Cancer Maternal Aunt 66       gallbladder cancer   Breast cancer Paternal Aunt        dx. in her 43s   Cervical cancer Maternal Grandmother        dx. in her 42s   Prostate cancer Maternal Grandfather        metastatic   Heart attack Paternal Grandmother    Hypertension Paternal Grandmother    Stroke Paternal Grandmother    Lung cancer Paternal Grandfather    Throat cancer Paternal Grandfather    Breast cancer Paternal Aunt        dx. in her 34s   Breast cancer Paternal Aunt        dx. in her 58s   Throat cancer Paternal Uncle    Lung cancer Paternal Uncle    Breast cancer Other        dx. 63s or 23s (mother's first cousins x3)   SIDS Maternal Aunt    Prostate cancer Maternal Uncle        dx. in his 65s, not metastatic   Prostate cancer Maternal Uncle        dx. in his 28s, not metastatic   Breast cancer Cousin        dx. late 16s (paternal cousin)   Breast cancer Other        dx. 40s/early 78s (maternal second cousins x2)    Social History   Socioeconomic History   Marital status: Married    Spouse name: [name]   Number of children: 3   Years of education: Not on file   Highest education level: Associate degree: occupational, Hotel manager, or vocational program  Occupational History   Occupation: CARDIOLOGY    Employer: [employer]   Occupation: RN  Tobacco Use   Smoking status: Former    Packs/day: 0.25    Years: 3.00    Pack years: 0.75    Types: Cigarettes    Quit date: 02/05/2007    Years since quitting: 14.3   Smokeless tobacco: Never  Vaping Use   Vaping Use: Never used  Substance and Sexual Activity   Alcohol use: Yes    Comment: occassional wine 2-3 times a year   Drug use: No   Sexual activity: Yes    Birth control/protection: Surgical  Other Topics Concern   Not on file  Social History Narrative   Lives [name] husband-10 years    4 kids  blended together   3 biologically hers.      No pets  in the home   Enjoy: playing tennis, watching games on tv , hgtv      Diet: well balanced, eats a lot of protein, and veggies, tries to avoid sweets and carbs   Caffeine: 2-3 cups daily   Water: 2.5 16 oz bottles      Wear seat beat    Doesn't wear sunscreen    Smoke and carbon monoxide detectors    Uses speaker phone while driving, avoids texting.    Social Determinants of Health   Financial Resource Strain: Not on file  Food Insecurity: Not on file  Transportation Needs: Not on file  Physical Activity: Not on file  Stress: Not on file  Social Connections: Not on file  Intimate Partner Violence: Not on file    Outpatient Medications Prior to Visit  Medication Sig Dispense Refill   acyclovir (ZOVIRAX) 400 MG tablet Take one tablet by mouth once daily 90 tablet 3   Calcium Citrate (CITRACAL PO) Take 500 mg by mouth daily.      cyclobenzaprine (FLEXERIL) 10 MG tablet Take 10 mg by mouth. Take daily as needed     diclofenac Sodium (VOLTAREN) 1 % GEL Apply 2 grams to the affected areas 3 times daily 100 g 0   ergocalciferol (VITAMIN D2) 1.25 MG (50000 UT) capsule Take 1 capsule (50,000 Units total) by mouth once a week. One capsule once weekly 12 capsule 1   Fluticasone Propionate (XHANCE) 93 MCG/ACT EXHU Place 1 spray into each nostril in the morning and at bedtime 16 mL 2   gabapentin (NEURONTIN) 100 MG capsule Take 1 capsule by mouth three times daily. 270 capsule 0   hydrOXYzine (VISTARIL) 50 MG capsule Take one capsule by mouth at bedtime for sleep 90 capsule 3   Multiple Vitamin (MULTIVITAMIN) tablet Take 1 tablet by mouth daily.      olmesartan-hydrochlorothiazide (BENICAR HCT) 20-12.5 MG tablet Take 1 tablet by mouth daily. 90 tablet 3   pantoprazole (PROTONIX) 40 MG tablet Take 1 tablet (40 mg total) by mouth daily before breakfast. 30 tablet 5   pantoprazole (PROTONIX) 40 MG tablet Take 1 tablet (40 mg total) by mouth 2  (two) times daily before a meal. 60 tablet 2   polycarbophil (FIBERCON) 625 MG tablet Take 1,250 mg by mouth daily.     Semaglutide, 2 MG/DOSE, (OZEMPIC, 2 MG/DOSE,) 8 MG/3ML SOPN Inject 2 mg under the skin once weekly 3 mL 0   spironolactone (ALDACTONE) 25 MG tablet Take 1/2 tablet by mouth once daily for facial acne 45 tablet 3   traZODone (DESYREL) 50 MG tablet TAKE 1 TABLET BY MOUTH AT BEDTIME 90 tablet 1   clobetasol (TEMOVATE) 0.05 % external solution Apply 1 application topically 2 (two) times daily.      predniSONE (DELTASONE) 10 MG tablet Take 1 tablet (10 mg total) by mouth 2 (two) times daily with a meal. 10 tablet 0   No facility-administered medications prior to visit.    Allergies  Allergen Reactions   Lisinopril Cough   Triamterene Other (See Comments)    Cramps     Review of Systems  Constitutional:  Negative for chills and fever.  HENT:  Positive for congestion, rhinorrhea and sinus pressure. Negative for sore throat.        B/l ear fullness  Eyes:  Negative for pain and discharge.  Respiratory:  Negative for cough and shortness of breath.   Cardiovascular:  Negative for chest pain and palpitations.  Gastrointestinal:  Negative  for diarrhea, nausea and vomiting.  Endocrine: Negative for polydipsia and polyuria.  Genitourinary:  Negative for dysuria and hematuria.  Musculoskeletal:  Negative for neck pain and neck stiffness.  Skin:  Negative for rash.  Neurological:  Negative for dizziness and weakness.  Psychiatric/Behavioral:  Negative for agitation and behavioral problems.       Objective:    Physical Exam Vitals reviewed.  Constitutional:      General: She is not in acute distress.    Appearance: She is not diaphoretic.  HENT:     Head: Normocephalic and atraumatic.     Right Ear: Ear canal and external ear normal. There is no impacted cerumen.     Left Ear: Ear canal and external ear normal. There is no impacted cerumen.     Nose: Congestion present.      Mouth/Throat:     Mouth: Mucous membranes are moist.  Eyes:     General: No scleral icterus.    Extraocular Movements: Extraocular movements intact.  Cardiovascular:     Rate and Rhythm: Normal rate and regular rhythm.     Pulses: Normal pulses.     Heart sounds: Normal heart sounds. No murmur heard. Pulmonary:     Breath sounds: Normal breath sounds. No wheezing or rales.  Neurological:     General: No focal deficit present.     Mental Status: She is alert and oriented to person, place, and time. Mental status is at baseline.     Sensory: No sensory deficit.     Motor: No weakness.  Psychiatric:        Mood and Affect: Mood normal.        Behavior: Behavior normal.    BP 124/81 (BP Location: Left Arm, Patient Position: Sitting, Cuff Size: Normal)   Pulse 74   Resp 18   Ht 5\' 8"  (1.727 m)   Wt 219 lb (99.3 kg)   SpO2 98%   BMI 33.30 kg/m  Wt Readings from Last 3 Encounters:  06/07/21 219 lb (99.3 kg)  03/26/21 223 lb (101.2 kg)  11/11/20 230 lb (104.3 kg)        Assessment & Plan:   Problem List Items Addressed This Visit       Respiratory   Allergic rhinitis Started Xyzal 5 mg QD Has tried Zyrtec and Claritin On Atarax at bedtime for itching Her ear fullness likely due to chronic sinusitis and frequent nose blowing, advised to use nasal saline spray instead and avoid forceful nose blows   Relevant Medications   levocetirizine (XYZAL) 5 MG tablet   Other Visit Diagnoses     Allergic conjunctivitis of both eyes    -  Primary Has tried Lumigan and Pataday Started Restasis eye drops Advised to discuss with her Optometrist/Ophthalmologist    Relevant Medications   cycloSPORINE (RESTASIS MULTIDOSE) 0.05 % ophthalmic emulsion        Meds ordered this encounter  Medications   cycloSPORINE (RESTASIS MULTIDOSE) 0.05 % ophthalmic emulsion    Sig: Place 1 drop into both eyes 2 (two) times daily.    Dispense:  0.4 mL    Refill:  2   levocetirizine  (XYZAL) 5 MG tablet    Sig: Take 1 tablet by mouth every evening.    Dispense:  30 tablet    Refill:  5     Berenise Hunton Keith Rake, MD

## 2021-06-09 ENCOUNTER — Encounter: Payer: Self-pay | Admitting: Internal Medicine

## 2021-06-09 ENCOUNTER — Other Ambulatory Visit (HOSPITAL_COMMUNITY): Payer: Self-pay

## 2021-06-09 ENCOUNTER — Other Ambulatory Visit: Payer: Self-pay

## 2021-06-11 ENCOUNTER — Other Ambulatory Visit (HOSPITAL_COMMUNITY): Payer: Self-pay

## 2021-06-18 DIAGNOSIS — Z760 Encounter for issue of repeat prescription: Secondary | ICD-10-CM | POA: Diagnosis not present

## 2021-06-19 ENCOUNTER — Other Ambulatory Visit: Payer: Self-pay | Admitting: Family Medicine

## 2021-06-19 ENCOUNTER — Other Ambulatory Visit (HOSPITAL_COMMUNITY): Payer: Self-pay

## 2021-06-19 MED FILL — Fluticasone Propionate Nasal Exhaler Susp 93 MCG/ACT: NASAL | 30 days supply | Qty: 16 | Fill #2 | Status: AC

## 2021-06-21 ENCOUNTER — Other Ambulatory Visit: Payer: Self-pay

## 2021-06-21 ENCOUNTER — Other Ambulatory Visit: Payer: Self-pay | Admitting: Family Medicine

## 2021-06-21 ENCOUNTER — Other Ambulatory Visit (HOSPITAL_COMMUNITY): Payer: Self-pay

## 2021-06-21 ENCOUNTER — Encounter (HOSPITAL_COMMUNITY): Payer: Self-pay | Admitting: Physical Therapy

## 2021-06-21 ENCOUNTER — Ambulatory Visit (HOSPITAL_COMMUNITY): Payer: 59 | Attending: Neurological Surgery | Admitting: Physical Therapy

## 2021-06-21 DIAGNOSIS — M545 Low back pain, unspecified: Secondary | ICD-10-CM | POA: Insufficient documentation

## 2021-06-21 MED FILL — Semaglutide Soln Pen-inj 2 MG/DOSE (8 MG/3ML): SUBCUTANEOUS | 28 days supply | Qty: 3 | Fill #0 | Status: AC

## 2021-06-21 NOTE — Therapy (Signed)
[place], [place], [address] Phone: [phone]   Fax:  [phone]  Physical Therapy Evaluation  Patient Details  Name: Kristine Lawson MRN: [id number] Date of Birth: 02/28/1971 Referring Provider (PT): Pieter Partridge Dawley DO   Encounter Date: 06/21/2021   PT End of Session - 06/21/21 0936     Visit Number 1    Number of Visits 8    Date for PT Re-Evaluation 07/19/21    Authorization Type [place] [facility] (no VL)    PT Start Time 0905    PT Stop Time 0945    PT Time Calculation (min) 40 min    Activity Tolerance Patient tolerated treatment well    Behavior During Therapy [facility] for tasks assessed/performed             Past Medical History:  Diagnosis Date   Allergy    Anemia    IDA   Arthritis    Arthritis    Family history of breast cancer    Family history of cancer of gallbladder    Family history of cervical cancer    Family history of lung cancer    Family history of prostate cancer    Family history of throat cancer    GERD (gastroesophageal reflux disease)    History of stomach ulcers    Hypertension    Insomnia    meds needed daily to achieve 6+ hours sleep.   Ureteral stone with hydronephrosis 05/09/2020    Past Surgical History:  Procedure Laterality Date   ABLATION     uterine ablation   APPENDECTOMY     BIOPSY  12/18/2019   Procedure: BIOPSY;  Surgeon: Rogene Houston, MD;  [facility];  Service: Endoscopy;;  gastric   BREATH TEK H PYLORI N/A 11/18/2014   Procedure: BREATH TEK Kandis Ban;  Surgeon: Greer Pickerel, MD;  Location: [facility];  Service: General;  Laterality: N/A;   CHOLECYSTECTOMY     COLONOSCOPY N/A 12/18/2019   Procedure: COLONOSCOPY;  Surgeon: Rogene Houston, MD;  [facility];  Service: Endoscopy;  Laterality: N/A;  [place] Right 05/07/2020   Procedure: CYSTOSCOPY WITH RIGHT RETROGRADE PYELOGRAM;  Surgeon: Cleon Gustin, MD;  Location[facility];  Service: Urology;  Laterality: Right;   CYSTOSCOPY W/ URETERAL STENT PLACEMENT Right 05/07/2020   Procedure: CYSTOSCOPY WITH RIGHT URETERAL STENT REPLACEMENT;  Surgeon: Cleon Gustin, MD;  [facility];  Service: Urology;  Laterality: Right;   ESOPHAGOGASTRODUODENOSCOPY N/A 10/01/2018   Procedure: ESOPHAGOGASTRODUODENOSCOPY (EGD);  Surgeon: Rogene Houston, MD;  [facility];  Service: Endoscopy;  Laterality: N/A;   ESOPHAGOGASTRODUODENOSCOPY N/A 12/18/2019   Procedure: ESOPHAGOGASTRODUODENOSCOPY (EGD);  Surgeon: Rogene Houston, MD;  [facility];  Service: Endoscopy;  Laterality: N/A;   EXTRACORPOREAL SHOCK WAVE LITHOTRIPSY Right 05/12/2020   Procedure: EXTRACORPOREAL SHOCK WAVE LITHOTRIPSY (ESWL);  Surgeon: Cleon Gustin, MD;  [facility];  Service: Urology;  Laterality: Right;   HERNIA REPAIR N/A    Phreesia 11/10/2020   LAPAROSCOPIC ROUX-EN-Y GASTRIC BYPASS WITH HIATAL HERNIA REPAIR N/A 02/10/2015   Procedure: LAPAROSCOPIC ROUX-EN-Y GASTRIC BYPASS ;  Surgeon: Greer Pickerel, MD;  [facility];  Service: General;  Laterality: N/A;   TUBAL LIGATION     UPPER GI ENDOSCOPY N/A 02/10/2015   Procedure: UPPER GI ENDOSCOPY;  Surgeon: Greer Pickerel, MD;  [facility];  Service: General;  Laterality:  N/A;    There were no vitals filed for this visit.    Subjective Assessment - 06/21/21 0910     Subjective Patient presents to therapy with complaint of back pain and sciatica. She has had issues on both sides. She reports issues are chronic. 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 She has not had therapy before. She takes OTC medication as needed. No other treatments at this time.    Pertinent History Stenosis    Limitations Sitting;Lifting;Standing;Walking;House hold activities    Diagnostic tests MRI    Patient Stated Goals Be able to work out again    Currently in Pain? Yes    Pain Score 6     Pain Location Back     Pain Orientation Posterior;Lower    Pain Descriptors / Indicators Aching;Dull    Pain Type Chronic pain    Pain Radiating Towards LT leg    Pain Onset More than a month ago    Pain Frequency Intermittent    Aggravating Factors  walking, prolonged positions, standing long periods    Pain Relieving Factors heat, meds    Effect of Pain on Daily Activities Limits                [facility] PT Assessment - 06/21/21 0001       Assessment   Medical Diagnosis Lumbar spondylosis    Referring Provider (PT) Pieter Partridge Dawley DO    Onset Date/Surgical Date --   Chronic   Prior Therapy No      Precautions   Precautions None      Restrictions   Weight Bearing Restrictions No      Balance Screen   Has the patient fallen in the past 6 months No      [place] residence      Prior Function   Level of Independence Independent      Cognition   Overall Cognitive Status Within Functional Limits for tasks assessed      Observation/Other Assessments   Focus on Therapeutic Outcomes (FOTO)  Not entered at time of eval      ROM / Strength   AROM / PROM / Strength AROM;Strength      AROM   AROM Assessment Site Lumbar    Lumbar Flexion WFL    Lumbar Extension 50% limited    Lumbar - Right Side Bend WFL    Lumbar - Left Side Bend [facility]      Strength   Strength Assessment Site Hip;Knee;Ankle    Right/Left Hip Right;Left    Right Hip Flexion 5/5    Right Hip Extension 4+/5    Right Hip ABduction 5/5    Left Hip Flexion 4+/5    Left Hip Extension 3-/5    Left Hip ABduction 4/5    Right/Left Knee Right;Left    Right Knee Extension 5/5    Left Knee Extension 4+/5    Right/Left Ankle Right;Left    Right Ankle Dorsiflexion 5/5    Left Ankle Dorsiflexion 5/5      Palpation   Palpation comment Mod TTP about lumbar paraspinals (L3-5)                        Objective measurements completed on examination: See above findings.        [facility] Adult PT Treatment/Exercise - 06/21/21 0001       Exercises   Exercises Lumbar      Lumbar  Exercises: Stretches   Double Knee to Chest Stretch --   10 x 5" (increased pain in RT thigh)   Other Lumbar Stretch Exercise prone on elbows 2 min      Lumbar Exercises: Supine   Ab Set 10 reps                     PT Education - 06/21/21 0912     Education Details on evaluation findings, POC and HEP    Person(s) Educated Patient    Methods Explanation;Handout    Comprehension Verbalized understanding              PT Short Term Goals - 06/21/21 0943       PT SHORT TERM GOAL #1   Title Patient will be independent with initial HEP and self-management strategies to improve functional outcomes    Time 2    Period Weeks    Status New    Target Date 07/05/21               PT Long Term Goals - 06/21/21 0943       PT LONG TERM GOAL #1   Title Patient will be independent with advance HEP and self-management strategies to improve functional outcomes    Time 4    Period Weeks    Status New    Target Date 07/19/21      PT LONG TERM GOAL #2   Title Patient will report at least 75% overall improvement in subjective complaint to indicate improvement in ability to perform ADLs.    Time 4    Period Weeks    Status New    Target Date 07/19/21      PT LONG TERM GOAL #3   Title Patient will improve FOTO score to predicted value to indicate improvement in functional outcomes    Time 4    Period Weeks    Status New    Target Date 07/19/21                    Plan - 06/21/21 1914     Clinical Impression Statement Patient is a 50 y.o. female who presents to physical therapy with complaint of LBP. Patient demonstrates decreased strength, ROM restriction, and increased tenderness to palpation which are likely contributing to symptoms of pain and are negatively impacting patient ability to perform ADLs and functional mobility tasks. Patient will benefit  from skilled physical therapy services to address these deficits to reduce pain, improve level of function with ADLs and functional mobility tasks.    Examination-Activity Limitations Stand;Transfers;Lift;Locomotion Level;Squat;Bend;Sit    Examination-Participation Restrictions [employer];Occupation;Community Activity;Shop;Cleaning    Stability/Clinical Decision Making Stable/Uncomplicated    Clinical Decision Making Low    Rehab Potential Good    PT Frequency 2x / week    PT Duration 4 weeks    PT Treatment/Interventions ADLs/Self Care Home Management;Biofeedback;Cryotherapy;Electrical Stimulation;Functional mobility training;Stair training;Gait training;DME Instruction;Patient/family education;Scar mobilization;Passive range of motion;Dry needling;Orthotic Fit/Training;Energy conservation;Splinting;Taping;Vasopneumatic Device;Manual techniques;Therapeutic activities;Iontophoresis 4mg /ml Dexamethasone;Therapeutic exercise;Moist Heat;Traction;Balance training;Manual lymph drainage;Ultrasound;Parrafin;Fluidtherapy;Contrast Bath;Neuromuscular re-education;Compression bandaging;Visual/perceptual remediation/compensation;Spinal Manipulations;Joint Manipulations    PT Next Visit Plan Complete FOTO. Review HEP, f/u on extension based exercise, continue if beneficial. Progress core stabilization as tolerated. Manual as needed for pain    PT Home Exercise Plan Eval: prone on elbows, ab brace    Consulted and Agree with Plan of Care Patient             Patient will benefit from  skilled therapeutic intervention in order to improve the following deficits and impairments:  Abnormal gait, Decreased range of motion, Improper body mechanics, Decreased mobility, Difficulty walking, Pain, Increased fascial restricitons, Decreased strength, Decreased activity tolerance  Visit Diagnosis: Low back pain, unspecified back pain laterality, unspecified chronicity, unspecified whether sciatica  present     Problem List Patient Active Problem List   Diagnosis Date Noted   Low back pain with left-sided sciatica 03/28/2021   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   Nephrolithiasis 06/01/2020   Ureteral calculus 05/07/2020   Allergic rhinitis 01/15/2020   Genetic testing 12/16/2019   Family history of breast cancer    Family history of prostate cancer    Family history of cancer of gallbladder    Family history of cervical cancer    Family history of lung cancer    Family history of throat cancer    Tendinitis 11/13/2019   Trapezius strain 11/13/2019   Family history of cancer 10/28/2019   Arthritis 06/05/2019   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 [place], [place], [address] Phone: [phone]   Fax:  [phone]  Name: LESTA LIMBERT MRN: [id number] Date of Birth: [birth year]

## 2021-06-21 NOTE — Patient Instructions (Signed)
Access Code: [email or web address] URL: [email or web address] Date: 06/21/2021 Prepared by: Josue Hector  Exercises Supine Transversus Abdominis Bracing - Hands on Stomach - 3 x daily - 7 x weekly - 2 sets - 10 reps - 5 second hold Static Prone on Elbows - 3 x daily - 7 x weekly - 1 sets - 1 reps - 3-5 minute hold

## 2021-06-22 ENCOUNTER — Other Ambulatory Visit (HOSPITAL_COMMUNITY): Payer: Self-pay

## 2021-06-23 ENCOUNTER — Other Ambulatory Visit: Payer: Self-pay

## 2021-06-23 ENCOUNTER — Encounter (HOSPITAL_COMMUNITY): Payer: Self-pay

## 2021-06-23 ENCOUNTER — Ambulatory Visit (HOSPITAL_COMMUNITY): Payer: 59

## 2021-06-23 DIAGNOSIS — M545 Low back pain, unspecified: Secondary | ICD-10-CM | POA: Diagnosis not present

## 2021-06-23 NOTE — Therapy (Signed)
[place], [place], [address] Phone: [phone]   Fax:  [phone]  Physical Therapy Treatment  Patient Details  Name: Kristine Lawson MRN: [id number] Date of Birth: 1970/11/09 Referring Provider (PT): Pieter Partridge Dawley DO   Encounter Date: 06/23/2021   PT End of Session - 06/23/21 1802     Visit Number 2    Number of Visits 8    Date for PT Re-Evaluation 07/19/21    Authorization Type [place] [employer] (no VL)    PT Start Time 1740    PT Stop Time 1823    PT Time Calculation (min) 43 min    Activity Tolerance Patient tolerated treatment well    Behavior During Therapy [facility] for tasks assessed/performed             Past Medical History:  Diagnosis Date   Allergy    Anemia    IDA   Arthritis    Arthritis    Family history of breast cancer    Family history of cancer of gallbladder    Family history of cervical cancer    Family history of lung cancer    Family history of prostate cancer    Family history of throat cancer    GERD (gastroesophageal reflux disease)    History of stomach ulcers    Hypertension    Insomnia    meds needed daily to achieve 6+ hours sleep.   Ureteral stone with hydronephrosis 05/09/2020    Past Surgical History:  Procedure Laterality Date   ABLATION     uterine ablation   APPENDECTOMY     BIOPSY  12/18/2019   Procedure: BIOPSY;  Surgeon: Rogene Houston, MD;  [facility];  Service: Endoscopy;;  gastric   BREATH TEK H PYLORI N/A 11/18/2014   Procedure: BREATH TEK Kandis Ban;  Surgeon: Greer Pickerel, MD;  Location: [facility];  Service: General;  Laterality: N/A;   CHOLECYSTECTOMY     COLONOSCOPY N/A 12/18/2019   Procedure: COLONOSCOPY;  Surgeon: Rogene Houston, MD;  [facility];  Service: Endoscopy;  Laterality: N/A;  [place] Right 05/07/2020   Procedure: CYSTOSCOPY WITH RIGHT RETROGRADE PYELOGRAM;  Surgeon: Cleon Gustin, MD;  Location: [facility];  Service: Urology;  Laterality: Right;   CYSTOSCOPY W/ URETERAL STENT PLACEMENT Right 05/07/2020   Procedure: CYSTOSCOPY WITH RIGHT URETERAL STENT REPLACEMENT;  Surgeon: Cleon Gustin, MD;  [facility]: [facility];  Service: Urology;  Laterality: Right;   ESOPHAGOGASTRODUODENOSCOPY N/A 10/01/2018   Procedure: ESOPHAGOGASTRODUODENOSCOPY (EGD);  Surgeon: Rogene Houston, MD;  [facility];  Service: Endoscopy;  Laterality: N/A;   ESOPHAGOGASTRODUODENOSCOPY N/A 12/18/2019   Procedure: ESOPHAGOGASTRODUODENOSCOPY (EGD);  Surgeon: Rogene Houston, MD;  [facility];  Service: Endoscopy;  Laterality: N/A;   EXTRACORPOREAL SHOCK WAVE LITHOTRIPSY Right 05/12/2020   Procedure: EXTRACORPOREAL SHOCK WAVE LITHOTRIPSY (ESWL);  Surgeon: Cleon Gustin, MD;  [facility]: [facility];  Service: Urology;  Laterality: Right;   HERNIA REPAIR N/A    Phreesia 11/10/2020   LAPAROSCOPIC ROUX-EN-Y GASTRIC BYPASS WITH HIATAL HERNIA REPAIR N/A 02/10/2015   Procedure: LAPAROSCOPIC ROUX-EN-Y GASTRIC BYPASS ;  Surgeon: Greer Pickerel, MD;  [facility];  Service: General;  Laterality: N/A;   TUBAL LIGATION     UPPER GI ENDOSCOPY N/A 02/10/2015   Procedure: UPPER GI ENDOSCOPY;  Surgeon: Greer Pickerel, MD;  [facility];  Service: General;  Laterality:  N/A;    There were no vitals filed for this visit.   Subjective Assessment - 06/23/21 1743     Subjective Reports she caused some aggravation on lower back and Rt hip after moving a table earlier today, bothered her for 2 hours.  No reports of pain currently.  Has began the HEP wihtout questions.    Pertinent History Stenosis    Patient Stated Goals Be able to work out again    Currently in Pain? No/denies    Pain Radiating Towards Lt LE down to foot    Pain Onset More than a month ago    Pain Frequency Intermittent    Aggravating Factors  walking, prolonged positions, standing long periods    Pain Relieving Factors heat, meds     Effect of Pain on Daily Activities limits                [facility] PT Assessment - 06/23/21 0001       Observation/Other Assessments   Focus on Therapeutic Outcomes (FOTO)  53.97% functional                           [facility] Adult PT Treatment/Exercise - 06/23/21 0001       Exercises   Exercises Lumbar      Lumbar Exercises: Stretches   Prone on Elbows Stretch Limitations 2 min    Press Ups 3 reps;5 seconds;10 seconds    Press Ups Limitations partial raise, pt stated UE weakness    Piriformis Stretch 2 reps;30 seconds    Piriformis Stretch Limitations seated, reports Rt hip pain in supine position      Lumbar Exercises: Supine   Ab Set 10 reps    Bent Knee Raise 10 reps    Bent Knee Raise Limitations with ab set    Bridge 10 reps    Bridge Limitations 3" holds      Lumbar Exercises: Prone   Other Prone Lumbar Exercises heel squeeze 5x 5"                     PT Education - 06/23/21 1757     Education Details Reviewed goals, educated importance of HEP, pt able to recall and demonstrate appropriate mechanics with current exercise program; pt reports benefits with prone position wiht decreased radicular symptoms    Person(s) Educated Patient    Methods Explanation;Demonstration;Verbal cues    Comprehension Verbalized understanding;Returned demonstration              PT Short Term Goals - 06/21/21 0943       PT SHORT TERM GOAL #1   Title Patient will be independent with initial HEP and self-management strategies to improve functional outcomes    Time 2    Period Weeks    Status New    Target Date 07/05/21               PT Long Term Goals - 06/21/21 0943       PT LONG TERM GOAL #1   Title Patient will be independent with advance HEP and self-management strategies to improve functional outcomes    Time 4    Period Weeks    Status New    Target Date 07/19/21      PT LONG TERM GOAL #2   Title Patient will report at  least 75% overall improvement in subjective complaint to indicate improvement in ability to perform ADLs.  Time 4    Period Weeks    Status New    Target Date 07/19/21      PT LONG TERM GOAL #3   Title Patient will improve FOTO score to predicted value to indicate improvement in functional outcomes    Time 4    Period Weeks    Status New    Target Date 07/19/21                   Plan - 06/23/21 1808     Clinical Impression Statement Reviewed goals, educated importance of HEP compliance for maximal benefits, pt able to recall and reports benefits with prone exercise and decreased radicular symptoms.  Session focus wiht core and proximal strnegthening with min cueing for stability.  Pt able to complete all exericses with good form and no reports of pain through session.  Advanced HEP with additional core, gluteal and stretches with printout given and ability to demonstrate appropriately.    Examination-Activity Limitations Stand;Transfers;Lift;Locomotion Level;Squat;Bend;Sit    Examination-Participation Restrictions [employer];Occupation;Community Activity;Shop;Cleaning    Stability/Clinical Decision Making Stable/Uncomplicated    Clinical Decision Making Low    Rehab Potential Good    PT Frequency 2x / week    PT Duration 4 weeks    PT Treatment/Interventions ADLs/Self Care Home Management;Biofeedback;Cryotherapy;Electrical Stimulation;Functional mobility training;Stair training;Gait training;DME Instruction;Patient/family education;Scar mobilization;Passive range of motion;Dry needling;Orthotic Fit/Training;Energy conservation;Splinting;Taping;Vasopneumatic Device;Manual techniques;Therapeutic activities;Iontophoresis 4mg /ml Dexamethasone;Therapeutic exercise;Moist Heat;Traction;Balance training;Manual lymph drainage;Ultrasound;Parrafin;Fluidtherapy;Contrast Bath;Neuromuscular re-education;Compression bandaging;Visual/perceptual remediation/compensation;Spinal  Manipulations;Joint Manipulations    PT Next Visit Plan f/u on extension based exercise, continue if beneficial. Progress core stabilization as tolerated. Manual as needed for pain    PT Home Exercise Plan Eval: prone on elbows, ab brace; 11/30: bent knee raise wiht ab set, bridge, seated piriformis stretch.    Consulted and Agree with Plan of Care Patient             Patient will benefit from skilled therapeutic intervention in order to improve the following deficits and impairments:  Abnormal gait, Decreased range of motion, Improper body mechanics, Decreased mobility, Difficulty walking, Pain, Increased fascial restricitons, Decreased strength, Decreased activity tolerance  Visit Diagnosis: Low back pain, unspecified back pain laterality, unspecified chronicity, unspecified whether sciatica present     Problem List Patient Active Problem List   Diagnosis Date Noted   Low back pain with left-sided sciatica 03/28/2021   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   Nephrolithiasis 06/01/2020   Ureteral calculus 05/07/2020   Allergic rhinitis 01/15/2020   Genetic testing 12/16/2019   Family history of breast cancer    Family history of prostate cancer    Family history of cancer of gallbladder    Family history of cervical cancer    Family history of lung cancer    Family history of throat cancer    Tendinitis 11/13/2019   Trapezius strain 11/13/2019   Family history of cancer 10/28/2019   Arthritis 06/05/2019   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 10/01/2018   Muscle spasm 02/04/2018   Goiter 10/07/2015   S/P gastric bypass 02/10/2015   Insomnia 04/05/2014   Dyslipidemia 42/59/5638   Metabolic syndrome X 75/64/3329   Vitamin D deficiency 04/05/2014   Psychosocial stressors 12/21/2012   Obesity (BMI 30.0-34.9) 03/05/2011   PES PLANUS 05/01/2008   Acne vulgaris 09/28/2007   HYPERTENSION, BENIGN ESSENTIAL 10/24/2006   Ihor Austin, LPTA/CLT; CBIS [phone]  Aldona Lento, PTA 06/23/2021, 6:34 PM  [place] [address]  [place], [place], [address] Phone: [phone]   Fax:  [phone]  Name: Kristine Lawson MRN: [id number] Date of Birth: 1971/01/25

## 2021-06-24 DIAGNOSIS — M5416 Radiculopathy, lumbar region: Secondary | ICD-10-CM | POA: Diagnosis not present

## 2021-06-29 ENCOUNTER — Other Ambulatory Visit: Payer: Self-pay | Admitting: Internal Medicine

## 2021-06-29 ENCOUNTER — Other Ambulatory Visit (HOSPITAL_COMMUNITY): Payer: Self-pay

## 2021-06-29 ENCOUNTER — Encounter: Payer: Self-pay | Admitting: Family Medicine

## 2021-06-29 DIAGNOSIS — H1013 Acute atopic conjunctivitis, bilateral: Secondary | ICD-10-CM

## 2021-06-29 DIAGNOSIS — J309 Allergic rhinitis, unspecified: Secondary | ICD-10-CM

## 2021-06-29 MED ORDER — CYCLOSPORINE 0.05 % OP EMUL
1.0000 [drp] | Freq: Two times a day (BID) | OPHTHALMIC | 2 refills | Status: DC
  Filled 2021-06-29: qty 5.5, 55d supply, fill #0

## 2021-06-30 ENCOUNTER — Ambulatory Visit (HOSPITAL_COMMUNITY): Payer: 59 | Attending: Neurological Surgery

## 2021-06-30 ENCOUNTER — Other Ambulatory Visit: Payer: Self-pay

## 2021-06-30 ENCOUNTER — Encounter (HOSPITAL_COMMUNITY): Payer: Self-pay

## 2021-06-30 DIAGNOSIS — M545 Low back pain, unspecified: Secondary | ICD-10-CM | POA: Diagnosis not present

## 2021-06-30 NOTE — Therapy (Signed)
[place], [place], [address] Phone: [phone]   Fax:  [phone]  Physical Therapy Treatment  Patient Details  Name: Kristine Lawson MRN: [id number] Date of Birth: 02-18-1971 Referring Provider (PT): Pieter Partridge Dawley DO   Encounter Date: 06/30/2021   PT End of Session - 06/30/21 1752     Visit Number 3    Number of Visits 8    Date for PT Re-Evaluation 07/19/21    Authorization Type [place] [employer] (no VL)    PT Start Time 1743    PT Stop Time 1749    PT Time Calculation (min) 38 min    Activity Tolerance Patient tolerated treatment well    Behavior During Therapy [facility] for tasks assessed/performed             Past Medical History:  Diagnosis Date   Allergy    Anemia    IDA   Arthritis    Arthritis    Family history of breast cancer    Family history of cancer of gallbladder    Family history of cervical cancer    Family history of lung cancer    Family history of prostate cancer    Family history of throat cancer    GERD (gastroesophageal reflux disease)    History of stomach ulcers    Hypertension    Insomnia    meds needed daily to achieve 6+ hours sleep.   Ureteral stone with hydronephrosis 05/09/2020    Past Surgical History:  Procedure Laterality Date   ABLATION     uterine ablation   APPENDECTOMY     BIOPSY  12/18/2019   Procedure: BIOPSY;  Surgeon: Rogene Houston, MD;  [facility];  Service: Endoscopy;;  gastric   BREATH TEK H PYLORI N/A 11/18/2014   Procedure: BREATH TEK Kandis Ban;  Surgeon: Greer Pickerel, MD;  Location: [facility];  Service: General;  Laterality: N/A;   CHOLECYSTECTOMY     COLONOSCOPY N/A 12/18/2019   Procedure: COLONOSCOPY;  Surgeon: Rogene Houston, MD;  [facility];  Service: Endoscopy;  Laterality: N/A;  [place] Right 05/07/2020   Procedure: CYSTOSCOPY WITH RIGHT RETROGRADE PYELOGRAM;  Surgeon: Cleon Gustin, MD;  Location: [facility];  Service: Urology;  Laterality: Right;   CYSTOSCOPY W/ URETERAL STENT PLACEMENT Right 05/07/2020   Procedure: CYSTOSCOPY WITH RIGHT URETERAL STENT REPLACEMENT;  Surgeon: Cleon Gustin, MD;  [facility];  Service: Urology;  Laterality: Right;   ESOPHAGOGASTRODUODENOSCOPY N/A 10/01/2018   Procedure: ESOPHAGOGASTRODUODENOSCOPY (EGD);  Surgeon: Rogene Houston, MD;  [facility];  Service: Endoscopy;  Laterality: N/A;   ESOPHAGOGASTRODUODENOSCOPY N/A 12/18/2019   Procedure: ESOPHAGOGASTRODUODENOSCOPY (EGD);  Surgeon: Rogene Houston, MD;  [facility];  Service: Endoscopy;  Laterality: N/A;   EXTRACORPOREAL SHOCK WAVE LITHOTRIPSY Right 05/12/2020   Procedure: EXTRACORPOREAL SHOCK WAVE LITHOTRIPSY (ESWL);  Surgeon: Cleon Gustin, MD;  [facility];  Service: Urology;  Laterality: Right;   HERNIA REPAIR N/A    Phreesia 11/10/2020   LAPAROSCOPIC ROUX-EN-Y GASTRIC BYPASS WITH HIATAL HERNIA REPAIR N/A 02/10/2015   Procedure: LAPAROSCOPIC ROUX-EN-Y GASTRIC BYPASS ;  Surgeon: Greer Pickerel, MD;  [facility];  Service: General;  Laterality: N/A;   TUBAL LIGATION     UPPER GI ENDOSCOPY N/A 02/10/2015   Procedure: UPPER GI ENDOSCOPY;  Surgeon: Greer Pickerel, MD;  [facility];  Service: General;  Laterality:  N/A;    There were no vitals filed for this visit.   Subjective Assessment - 06/30/21 1748     Subjective Pt stated she has increased pain the last couple of days, no reports of moving stuff or lifting.  Reports back feels stiff today, may be related to the cold damp weather.    Pertinent History Stenosis    Patient Stated Goals Be able to work out again    Currently in Pain? Yes    Pain Score 6     Pain Location Back    Pain Orientation Lower;Posterior    Pain Descriptors / Indicators Aching;Dull;Tingling    Pain Type Chronic pain    Pain Onset More than a month ago    Pain Frequency Intermittent    Aggravating Factors   walking, standing long periods, prolonged positions    Pain Relieving Factors heat, meds    Effect of Pain on Daily Activities limits                               [facility] Adult PT Treatment/Exercise - 06/30/21 0001       Exercises   Exercises Lumbar      Lumbar Exercises: Stretches   Standing Extension 5 reps;10 seconds    Prone on Elbows Stretch Limitations 2 min    Press Ups 5 reps;10 seconds    Press Ups Limitations improved range    Other Lumbar Stretch Exercise cat/ camel 5x 10"    Other Lumbar Stretch Exercise child's pose 2x 30"      Lumbar Exercises: Supine   Bent Knee Raise 10 reps    Bent Knee Raise Limitations 90/90 toe tapping with ab set, cueing not to arch back    Bridge 15 reps    Other Supine Lumbar Exercises 90/90 toe tapping with ab set, cueing not to arch back      Lumbar Exercises: Prone   Straight Leg Raise 10 reps                       PT Short Term Goals - 06/21/21 0943       PT SHORT TERM GOAL #1   Title Patient will be independent with initial HEP and self-management strategies to improve functional outcomes    Time 2    Period Weeks    Status New    Target Date 07/05/21               PT Long Term Goals - 06/21/21 0943       PT LONG TERM GOAL #1   Title Patient will be independent with advance HEP and self-management strategies to improve functional outcomes    Time 4    Period Weeks    Status New    Target Date 07/19/21      PT LONG TERM GOAL #2   Title Patient will report at least 75% overall improvement in subjective complaint to indicate improvement in ability to perform ADLs.    Time 4    Period Weeks    Status New    Target Date 07/19/21      PT LONG TERM GOAL #3   Title Patient will improve FOTO score to predicted value to indicate improvement in functional outcomes    Time 4    Period Weeks    Status New    Target Date 07/19/21  Plan - 06/30/21 1756      Clinical Impression Statement Continued with extension biased exercises. Progressed hip strengthening with additional prone hip extension with cueing to reduce trunk rotation.  Added spinal mobility stretches to address tightness in back.  Pt reports decreased radicular symptoms in prone position, reports symptoms do return upon standing.  Pain reduced to 4/10 at EOS.    Examination-Activity Limitations Stand;Transfers;Lift;Locomotion Level;Squat;Bend;Sit    Examination-Participation Restrictions [employer];Occupation;Community Activity;Shop;Cleaning    Stability/Clinical Decision Making Stable/Uncomplicated    Clinical Decision Making Low    Rehab Potential Good    PT Frequency 2x / week    PT Duration 4 weeks    PT Treatment/Interventions ADLs/Self Care Home Management;Biofeedback;Cryotherapy;Electrical Stimulation;Functional mobility training;Stair training;Gait training;DME Instruction;Patient/family education;Scar mobilization;Passive range of motion;Dry needling;Orthotic Fit/Training;Energy conservation;Splinting;Taping;Vasopneumatic Device;Manual techniques;Therapeutic activities;Iontophoresis 4mg /ml Dexamethasone;Therapeutic exercise;Moist Heat;Traction;Balance training;Manual lymph drainage;Ultrasound;Parrafin;Fluidtherapy;Contrast Bath;Neuromuscular re-education;Compression bandaging;Visual/perceptual remediation/compensation;Spinal Manipulations;Joint Manipulations    PT Next Visit Plan f/u on extension based exercise, continue if beneficial. Progress core stabilization as tolerated. Manual as needed for pain    PT Home Exercise Plan Eval: prone on elbows, ab brace; 11/30: bent knee raise wiht ab set, bridge, seated piriformis stretch.    Consulted and Agree with Plan of Care Patient             Patient will benefit from skilled therapeutic intervention in order to improve the following deficits and impairments:  Abnormal gait, Decreased range of motion, Improper body  mechanics, Decreased mobility, Difficulty walking, Pain, Increased fascial restricitons, Decreased strength, Decreased activity tolerance  Visit Diagnosis: Low back pain, unspecified back pain laterality, unspecified chronicity, unspecified whether sciatica present     Problem List Patient Active Problem List   Diagnosis Date Noted   Low back pain with left-sided sciatica 03/28/2021   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   Nephrolithiasis 06/01/2020   Ureteral calculus 05/07/2020   Allergic rhinitis 01/15/2020   Genetic testing 12/16/2019   Family history of breast cancer    Family history of prostate cancer    Family history of cancer of gallbladder    Family history of cervical cancer    Family history of lung cancer    Family history of throat cancer    Tendinitis 11/13/2019   Trapezius strain 11/13/2019   Family history of cancer 10/28/2019   Arthritis 06/05/2019   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

## 2021-07-01 ENCOUNTER — Encounter (HOSPITAL_COMMUNITY): Payer: Self-pay

## 2021-07-01 ENCOUNTER — Telehealth: Payer: Self-pay

## 2021-07-01 ENCOUNTER — Other Ambulatory Visit (HOSPITAL_COMMUNITY): Payer: Self-pay

## 2021-07-01 ENCOUNTER — Ambulatory Visit (HOSPITAL_COMMUNITY): Payer: 59

## 2021-07-01 DIAGNOSIS — M545 Low back pain, unspecified: Secondary | ICD-10-CM

## 2021-07-01 MED ORDER — CYCLOSPORINE 0.05 % OP EMUL
1.0000 [drp] | Freq: Two times a day (BID) | OPHTHALMIC | 2 refills | Status: DC
  Filled 2021-07-01: qty 5.5, 22d supply, fill #0
  Filled 2021-07-08: qty 5.5, 30d supply, fill #0

## 2021-07-01 MED ORDER — LEVOCETIRIZINE DIHYDROCHLORIDE 5 MG PO TABS
5.0000 mg | ORAL_TABLET | Freq: Every evening | ORAL | 5 refills | Status: DC
  Filled 2021-07-01 – 2021-10-04 (×2): qty 30, 30d supply, fill #0
  Filled 2021-10-29: qty 90, 90d supply, fill #1
  Filled 2022-01-21: qty 60, 60d supply, fill #2

## 2021-07-01 NOTE — Telephone Encounter (Signed)
My satiety is not being suppressed when eating like before. I noticed this 3-4 weeks ago. Afraid my weight is going to go back up. Can this dose be increased again? I'm currently taking 2 mg each week. My appointment is 08/06/21.

## 2021-07-01 NOTE — Patient Instructions (Addendum)
Straight Leg Raise    Tighten stomach and slowly raise locked right leg 12 inches from floor. Repeat 10 times per set. Do 2 sets per day.  http://orth.exer.us/1103   Copyright  VHI. All rights reserved.   Abduction: Side Leg Lift (Eccentric) - Side-Lying    Lie on side. Lift top leg slightly higher than shoulder level. Keep top leg straight with body, toes pointing forward. Slowly lower for 3-5 seconds.  10 reps per set, 2 sets per day, 4 days per week.   http://ecce.exer.us/63   Copyright  VHI. All rights reserved.   Hip Extension: Prone    Tighten gluteal muscle. Lift one leg 10 times. Restabilize pelvis. Repeat with other leg. Keep pelvis still.  Be sure pelvis does not rotate and back does not arch. Do 2 sets, 10 reps, 4 time a week.    http://ss.exer.us/65   Copyright  VHI. All rights reserved.   Functional Quadriceps: Sit to Stand    Sit on edge of chair, feet flat on floor. Stand upright, extending knees fully. Repeat 10 times per set. Do 2 sets per day.  http://orth.exer.us/735   Copyright  VHI. All rights reserved.

## 2021-07-01 NOTE — Therapy (Signed)
[place], [place], [address] Phone: [phone]   Fax:  [phone]  Physical Therapy Treatment  Patient Details  Name: Kristine Lawson MRN: [id number] Date of Birth: July 23, 1971 Referring Provider (PT): Pieter Partridge Dawley DO   Encounter Date: 07/01/2021   PT End of Session - 07/01/21 1732     Visit Number 4    Number of Visits 8    Date for PT Re-Evaluation 07/19/21    Authorization Type [place] UMR (no VL)    PT Start Time 1725    PT Stop Time 1805    PT Time Calculation (min) 40 min    Activity Tolerance Patient tolerated treatment well    Behavior During Therapy [facility] for tasks assessed/performed             Past Medical History:  Diagnosis Date   Allergy    Anemia    IDA   Arthritis    Arthritis    Family history of breast cancer    Family history of cancer of gallbladder    Family history of cervical cancer    Family history of lung cancer    Family history of prostate cancer    Family history of throat cancer    GERD (gastroesophageal reflux disease)    History of stomach ulcers    Hypertension    Insomnia    meds needed daily to achieve 6+ hours sleep.   Ureteral stone with hydronephrosis 05/09/2020    Past Surgical History:  Procedure Laterality Date   ABLATION     uterine ablation   APPENDECTOMY     BIOPSY  12/18/2019   Procedure: BIOPSY;  Surgeon: Rogene Houston, MD;  [facility];  Service: Endoscopy;;  gastric   BREATH TEK H PYLORI N/A 11/18/2014   Procedure: BREATH TEK Kandis Ban;  Surgeon: Greer Pickerel, MD;  Location: [facility];  Service: General;  Laterality: N/A;   CHOLECYSTECTOMY     COLONOSCOPY N/A 12/18/2019   Procedure: COLONOSCOPY;  Surgeon: Rogene Houston, MD;  [facility];  Service: Endoscopy;  Laterality: N/A;  [place] Right 05/07/2020   Procedure: CYSTOSCOPY WITH RIGHT RETROGRADE PYELOGRAM;  Surgeon: Cleon Gustin, MD;  Location: [facility];  Service: Urology;  Laterality: Right;   CYSTOSCOPY W/ URETERAL STENT PLACEMENT Right 05/07/2020   Procedure: CYSTOSCOPY WITH RIGHT URETERAL STENT REPLACEMENT;  Surgeon: Cleon Gustin, MD;  [facility]: [facility];  Service: Urology;  Laterality: Right;   ESOPHAGOGASTRODUODENOSCOPY N/A 10/01/2018   Procedure: ESOPHAGOGASTRODUODENOSCOPY (EGD);  Surgeon: Rogene Houston, MD;  [facility];  Service: Endoscopy;  Laterality: N/A;   ESOPHAGOGASTRODUODENOSCOPY N/A 12/18/2019   Procedure: ESOPHAGOGASTRODUODENOSCOPY (EGD);  Surgeon: Rogene Houston, MD;  [facility];  Service: Endoscopy;  Laterality: N/A;   EXTRACORPOREAL SHOCK WAVE LITHOTRIPSY Right 05/12/2020   Procedure: EXTRACORPOREAL SHOCK WAVE LITHOTRIPSY (ESWL);  Surgeon: Cleon Gustin, MD;  Location: [facility];  Service: Urology;  Laterality: Right;   HERNIA REPAIR N/A    Phreesia 11/10/2020   LAPAROSCOPIC ROUX-EN-Y GASTRIC BYPASS WITH HIATAL HERNIA REPAIR N/A 02/10/2015   Procedure: LAPAROSCOPIC ROUX-EN-Y GASTRIC BYPASS ;  Surgeon: Greer Pickerel, MD;  [facility];  Service: General;  Laterality: N/A;   TUBAL LIGATION     UPPER GI ENDOSCOPY N/A 02/10/2015   Procedure: UPPER GI ENDOSCOPY;  Surgeon: Greer Pickerel, MD;  [facility];  Service: General;  Laterality:  N/A;    There were no vitals filed for this visit.   Subjective Assessment - 07/01/21 1726     Subjective Pt stated she is feeling good today, a little sore but productive.    Pertinent History Stenosis    Patient Stated Goals Be able to work out again    Currently in Pain? No/denies                               [facility] Adult PT Treatment/Exercise - 07/01/21 0001       Exercises   Exercises Lumbar      Lumbar Exercises: Stretches   Prone on Elbows Stretch Limitations 2 min    Other Lumbar Stretch Exercise cat/ camel 5x 10"      Lumbar Exercises: Seated   Sit to Stand 10 reps    Sit to Stand  Limitations elevated surface then able to complete lower surface following cueing for mechanics.      Lumbar Exercises: Supine   Bridge 15 reps;3 seconds    Straight Leg Raise 10 reps    Straight Leg Raises Limitations with ab set    Other Supine Lumbar Exercises 90/90 toe tapping with ab set, cueing not to arch back      Lumbar Exercises: Sidelying   Hip Abduction 10 reps      Lumbar Exercises: Prone   Straight Leg Raise 10 reps;3 seconds                       PT Short Term Goals - 06/21/21 0943       PT SHORT TERM GOAL #1   Title Patient will be independent with initial HEP and self-management strategies to improve functional outcomes    Time 2    Period Weeks    Status New    Target Date 07/05/21               PT Long Term Goals - 07/01/21 1755       PT LONG TERM GOAL #1   Title --                   Plan - 07/01/21 1734     Clinical Impression Statement Session focus with core stability and proximal strengthening, added SLR all directions with good form and control, min cueing for ab set with exercise.  Pt educated on proper technqiues for knee and back pain to improve STS wihtout need for HHA.  No reports of pain through session.  Added SLR and STS to HEP.    Examination-Activity Limitations Stand;Transfers;Lift;Locomotion Level;Squat;Bend;Sit    Examination-Participation Restrictions [employer];Occupation;Community Activity;Shop;Cleaning    Stability/Clinical Decision Making Stable/Uncomplicated    Clinical Decision Making Low    Rehab Potential Good    PT Frequency 2x / week    PT Duration 4 weeks    PT Treatment/Interventions ADLs/Self Care Home Management;Biofeedback;Cryotherapy;Electrical Stimulation;Functional mobility training;Stair training;Gait training;DME Instruction;Patient/family education;Scar mobilization;Passive range of motion;Dry needling;Orthotic Fit/Training;Energy conservation;Splinting;Taping;Vasopneumatic  Device;Manual techniques;Therapeutic activities;Iontophoresis 4mg /ml Dexamethasone;Therapeutic exercise;Moist Heat;Traction;Balance training;Manual lymph drainage;Ultrasound;Parrafin;Fluidtherapy;Contrast Bath;Neuromuscular re-education;Compression bandaging;Visual/perceptual remediation/compensation;Spinal Manipulations;Joint Manipulations    PT Next Visit Plan Progress to quadruped UE/LE and functional strengthening with squats, heel raise and lunges, balance.  f/u on extension based exercise, continue if beneficial. Progress core stabilization as tolerated. Manual as needed for pain    PT Home Exercise Plan Eval: prone on elbows, ab brace; 11/30: bent knee raise wiht ab  set, bridge, seated piriformis stretch.; 12/08: SLR all directions and STS    Consulted and Agree with Plan of Care Patient             Patient will benefit from skilled therapeutic intervention in order to improve the following deficits and impairments:  Abnormal gait, Decreased range of motion, Improper body mechanics, Decreased mobility, Difficulty walking, Pain, Increased fascial restricitons, Decreased strength, Decreased activity tolerance  Visit Diagnosis: Low back pain, unspecified back pain laterality, unspecified chronicity, unspecified whether sciatica present     Problem List Patient Active Problem List   Diagnosis Date Noted   Low back pain with left-sided sciatica 03/28/2021   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   Nephrolithiasis 06/01/2020   Ureteral calculus 05/07/2020   Allergic rhinitis 01/15/2020   Genetic testing 12/16/2019   Family history of breast cancer    Family history of prostate cancer    Family history of cancer of gallbladder    Family history of cervical cancer    Family history of lung cancer    Family history of throat cancer    Tendinitis 11/13/2019   Trapezius strain 11/13/2019   Family history of cancer 10/28/2019   Arthritis 06/05/2019   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

## 2021-07-05 LAB — HM DIABETES EYE EXAM

## 2021-07-06 ENCOUNTER — Encounter (HOSPITAL_COMMUNITY): Payer: 59

## 2021-07-08 ENCOUNTER — Encounter (HOSPITAL_COMMUNITY): Payer: 59

## 2021-07-08 ENCOUNTER — Other Ambulatory Visit (HOSPITAL_COMMUNITY): Payer: Self-pay

## 2021-07-08 ENCOUNTER — Other Ambulatory Visit: Payer: Self-pay | Admitting: Nurse Practitioner

## 2021-07-08 DIAGNOSIS — H1013 Acute atopic conjunctivitis, bilateral: Secondary | ICD-10-CM

## 2021-07-08 MED ORDER — CYCLOSPORINE 0.05 % OP EMUL
1.0000 [drp] | Freq: Two times a day (BID) | OPHTHALMIC | 2 refills | Status: DC
  Filled 2021-07-08: qty 5.5, 30d supply, fill #0

## 2021-07-09 ENCOUNTER — Other Ambulatory Visit: Payer: Self-pay

## 2021-07-09 ENCOUNTER — Other Ambulatory Visit (HOSPITAL_COMMUNITY): Payer: Self-pay

## 2021-07-09 DIAGNOSIS — H1013 Acute atopic conjunctivitis, bilateral: Secondary | ICD-10-CM

## 2021-07-09 MED ORDER — CYCLOSPORINE 0.05 % OP EMUL
1.0000 [drp] | Freq: Two times a day (BID) | OPHTHALMIC | 2 refills | Status: DC
  Filled 2021-07-09: qty 5.5, 55d supply, fill #0

## 2021-07-12 ENCOUNTER — Ambulatory Visit (HOSPITAL_COMMUNITY): Payer: 59 | Admitting: Physical Therapy

## 2021-07-12 ENCOUNTER — Other Ambulatory Visit: Payer: Self-pay

## 2021-07-12 ENCOUNTER — Encounter (HOSPITAL_COMMUNITY): Payer: Self-pay | Admitting: Physical Therapy

## 2021-07-12 DIAGNOSIS — M545 Low back pain, unspecified: Secondary | ICD-10-CM

## 2021-07-12 NOTE — Patient Instructions (Signed)
Access Code: [email or web address] URL: [email or web address] Date: 07/12/2021 Prepared by: Josue Hector  Exercises Dead Bug - 1 x daily - 5 x weekly - 2 sets - 10 reps Beginner Front Arm Support - 1 x daily - 5 x weekly - 2 sets - 10 reps Bird Dog - 1 x daily - 5 x weekly - 2 sets - 10 reps

## 2021-07-12 NOTE — Therapy (Signed)
[place] [address] [place], [place], [address] Phone: [phone]   Fax:  [phone]  Physical Therapy Treatment  Patient Details  Name: Kristine Lawson MRN: [id number] Date of Birth: 1970-12-27 Referring Provider (PT): Pieter Partridge Dawley DO  PHYSICAL THERAPY DISCHARGE SUMMARY  Visits from Start of Care: 5  Current functional level related to goals / functional outcomes: See below   Remaining deficits: See below   Education / Equipment: See assessment    Patient agrees to discharge. Patient goals were met. Patient is being discharged due to meeting the stated rehab goals.  Encounter Date: 07/12/2021   PT End of Session - 07/12/21 1739     Visit Number 5    Number of Visits 8    Date for PT Re-Evaluation 07/19/21    Authorization Type [place] UMR (no VL)    PT Start Time 1735    PT Stop Time 1810    PT Time Calculation (min) 35 min    Activity Tolerance Patient tolerated treatment well    Behavior During Therapy WFL for tasks assessed/performed             Past Medical History:  Diagnosis Date   Allergy    Anemia    IDA   Arthritis    Arthritis    Family history of breast cancer    Family history of cancer of gallbladder    Family history of cervical cancer    Family history of lung cancer    Family history of prostate cancer    Family history of throat cancer    GERD (gastroesophageal reflux disease)    History of stomach ulcers    Hypertension    Insomnia    meds needed daily to achieve 6+ hours sleep.   Ureteral stone with hydronephrosis 05/09/2020    Past Surgical History:  Procedure Laterality Date   ABLATION     uterine ablation   APPENDECTOMY     BIOPSY  12/18/2019   Procedure: BIOPSY;  Surgeon: Rogene Houston, MD;  [facility];  Service: Endoscopy;;  gastric   BREATH TEK H PYLORI N/A 11/18/2014   Procedure: BREATH TEK Kandis Ban;  Surgeon: Greer Pickerel, MD;  Location: [facility];  Service: General;  Laterality: N/A;   CHOLECYSTECTOMY     COLONOSCOPY N/A 12/18/2019   Procedure: COLONOSCOPY;  Surgeon: Rogene Houston, MD;  [facility];  Service: Endoscopy;  Laterality: N/A;  [place] Right 05/07/2020   Procedure: CYSTOSCOPY WITH RIGHT RETROGRADE PYELOGRAM;  Surgeon: Cleon Gustin, MD;  Location[facility];  Service: Urology;  Laterality: Right;   CYSTOSCOPY W/ URETERAL STENT PLACEMENT Right 05/07/2020   Procedure: CYSTOSCOPY WITH RIGHT URETERAL STENT REPLACEMENT;  Surgeon: Cleon Gustin, MD;  Location[facility];  Service: Urology;  Laterality: Right;   ESOPHAGOGASTRODUODENOSCOPY N/A 10/01/2018   Procedure: ESOPHAGOGASTRODUODENOSCOPY (EGD);  Surgeon: Rogene Houston, MD;  [facility];  Service: Endoscopy;  Laterality: N/A;   ESOPHAGOGASTRODUODENOSCOPY N/A 12/18/2019   Procedure: ESOPHAGOGASTRODUODENOSCOPY (EGD);  Surgeon: Rogene Houston, MD;  [facility];  Service: Endoscopy;  Laterality: N/A;   EXTRACORPOREAL SHOCK WAVE LITHOTRIPSY Right 05/12/2020   Procedure: EXTRACORPOREAL SHOCK WAVE LITHOTRIPSY (ESWL);  Surgeon: Cleon Gustin, MD;  [facility]: [facility];  Service: Urology;  Laterality: Right;   HERNIA REPAIR N/A    Phreesia 11/10/2020   LAPAROSCOPIC ROUX-EN-Y GASTRIC BYPASS WITH HIATAL HERNIA REPAIR  N/A 02/10/2015   Procedure: LAPAROSCOPIC ROUX-EN-Y GASTRIC BYPASS ;  Surgeon: Greer Pickerel, MD;  [facility];  Service: General;  Laterality: N/A;   TUBAL LIGATION     UPPER GI ENDOSCOPY N/A 02/10/2015   Procedure: UPPER GI ENDOSCOPY;  Surgeon: Greer Pickerel, MD;  [facility];  Service: General;  Laterality: N/A;    There were no vitals filed for this visit.   Subjective Assessment - 07/12/21 1739     Subjective Patient says she is doing great. She reports 90% improvement since starting therapy. She feels ready for DC today. She is able to do most all of her normal activity with no  increased pain.    Pertinent History Stenosis    Patient Stated Goals Be able to work out again    Currently in Pain? No/denies                [facility] PT Assessment - 07/12/21 0001       Assessment   Medical Diagnosis Lumbar spondylosis    Referring Provider (PT) Pieter Partridge Dawley DO    Prior Therapy No      Precautions   Precautions None      Restrictions   Weight Bearing Restrictions No      Balance Screen   Has the patient fallen in the past 6 months No      [place] residence      Prior Function   Level of Independence Independent      Cognition   Overall Cognitive Status Within Functional Limits for tasks assessed      Observation/Other Assessments   Focus on Therapeutic Outcomes (FOTO)  82% function      AROM   Lumbar Flexion WFL    Lumbar Extension 25% limited   was 50%   Lumbar - Right Side Bend [facility]    Lumbar - Left Side Bend [facility]      Strength   Right Hip Flexion 5/5    Right Hip Extension 4+/5    Right Hip ABduction 5/5    Left Hip Flexion 5/5   was 4+   Left Hip Extension 4+/5   was 3-   Left Hip ABduction 4+/5   was 4                          [facility] Adult PT Treatment/Exercise - 07/12/21 0001       Lumbar Exercises: Supine   Dead Bug 10 reps      Lumbar Exercises: Quadruped   Straight Leg Raise 10 reps                       PT Short Term Goals - 07/12/21 1806       PT SHORT TERM GOAL #1   Title Patient will be independent with initial HEP and self-management strategies to improve functional outcomes    Time 2    Period Weeks    Status Achieved    Target Date 07/05/21               PT Long Term Goals - 07/12/21 1806       PT LONG TERM GOAL #1   Title Patient will be independent with advance HEP and self-management strategies to improve functional outcomes    Baseline Reviewed and answered all patient questions    Time 4    Period Weeks  Status Achieved     Target Date 07/19/21      PT LONG TERM GOAL #2   Title Patient will report at least 75% overall improvement in subjective complaint to indicate improvement in ability to perform ADLs.    Baseline Reports 90%    Time 4    Period Weeks    Status Achieved    Target Date 07/19/21      PT LONG TERM GOAL #3   Title Patient will improve FOTO score to predicted value to indicate improvement in functional outcomes    Baseline See FOTO    Time 4    Period Weeks    Status Achieved    Target Date 07/19/21                   Plan - 07/12/21 1814     Clinical Impression Statement Patient shows good progress and has currently met all therapy goals. Significant improvement in strength, pain free lumbar AROM and subjective reports. Patient reports 90% overall functional improvement and would like to DC from therapy today. Reviewed comprehensive HEP and answered all patient questions. Patient issued updated handout. Patient being DC from therapy today with all goals met. Encouraged patient to follow up with therapy services with any further questions or concerns.    Examination-Activity Limitations Stand;Transfers;Lift;Locomotion Level;Squat;Bend;Sit    Examination-Participation Restrictions [employer];Occupation;Community Activity;Shop;Cleaning    Stability/Clinical Decision Making Stable/Uncomplicated    Rehab Potential Good    PT Treatment/Interventions ADLs/Self Care Home Management;Biofeedback;Cryotherapy;Electrical Stimulation;Functional mobility training;Stair training;Gait training;DME Instruction;Patient/family education;Scar mobilization;Passive range of motion;Dry needling;Orthotic Fit/Training;Energy conservation;Splinting;Taping;Vasopneumatic Device;Manual techniques;Therapeutic activities;Iontophoresis 20m/ml Dexamethasone;Therapeutic exercise;Moist Heat;Traction;Balance training;Manual lymph drainage;Ultrasound;Parrafin;Fluidtherapy;Contrast Bath;Neuromuscular  re-education;Compression bandaging;Visual/perceptual remediation/compensation;Spinal Manipulations;Joint Manipulations    PT Next Visit Plan DC to HEP    PT Home Exercise Plan Eval: prone on elbows, ab brace; 11/30: bent knee raise wiht ab set, bridge, seated piriformis stretch.; 12/08: SLR all directions and STS    Consulted and Agree with Plan of Care Patient             Patient will benefit from skilled therapeutic intervention in order to improve the following deficits and impairments:  Abnormal gait, Decreased range of motion, Improper body mechanics, Decreased mobility, Difficulty walking, Pain, Increased fascial restricitons, Decreased strength, Decreased activity tolerance  Visit Diagnosis: Low back pain, unspecified back pain laterality, unspecified chronicity, unspecified whether sciatica present     Problem List Patient Active Problem List   Diagnosis Date Noted   Low back pain with left-sided sciatica 03/28/2021   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   Nephrolithiasis 06/01/2020   Ureteral calculus 05/07/2020   Allergic rhinitis 01/15/2020   Genetic testing 12/16/2019   Family history of breast cancer    Family history of prostate cancer    Family history of cancer of gallbladder    Family history of cervical cancer    Family history of lung cancer    Family history of throat cancer    Tendinitis 11/13/2019   Trapezius strain 11/13/2019   Family history of cancer 10/28/2019   Arthritis 06/05/2019   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 [place], [place], [address] Phone: [phone]   Fax:  [phone]  Name: SINTIA MCKISSIC MRN: [id number] Date of Birth: 1971-04-14

## 2021-07-14 ENCOUNTER — Encounter (HOSPITAL_COMMUNITY): Payer: 59 | Admitting: Physical Therapy

## 2021-07-14 ENCOUNTER — Other Ambulatory Visit: Payer: Self-pay | Admitting: Family Medicine

## 2021-07-14 ENCOUNTER — Other Ambulatory Visit (HOSPITAL_COMMUNITY): Payer: Self-pay

## 2021-07-14 MED ORDER — OZEMPIC (2 MG/DOSE) 8 MG/3ML ~~LOC~~ SOPN
PEN_INJECTOR | SUBCUTANEOUS | 3 refills | Status: DC
  Filled 2021-07-14: qty 3, 28d supply, fill #0

## 2021-07-21 ENCOUNTER — Ambulatory Visit (HOSPITAL_COMMUNITY): Payer: 59

## 2021-07-23 ENCOUNTER — Ambulatory Visit (HOSPITAL_COMMUNITY): Payer: 59

## 2021-08-06 ENCOUNTER — Ambulatory Visit: Payer: 59 | Admitting: Family Medicine

## 2021-08-06 ENCOUNTER — Other Ambulatory Visit: Payer: Self-pay

## 2021-08-06 ENCOUNTER — Encounter: Payer: Self-pay | Admitting: Family Medicine

## 2021-08-06 ENCOUNTER — Other Ambulatory Visit (HOSPITAL_COMMUNITY): Payer: Self-pay

## 2021-08-06 VITALS — BP 129/85 | HR 100 | Ht 68.0 in | Wt 219.1 lb

## 2021-08-06 DIAGNOSIS — Z23 Encounter for immunization: Secondary | ICD-10-CM

## 2021-08-06 DIAGNOSIS — Z0001 Encounter for general adult medical examination with abnormal findings: Secondary | ICD-10-CM

## 2021-08-06 DIAGNOSIS — E66811 Obesity, class 1: Secondary | ICD-10-CM

## 2021-08-06 DIAGNOSIS — H04123 Dry eye syndrome of bilateral lacrimal glands: Secondary | ICD-10-CM | POA: Diagnosis not present

## 2021-08-06 DIAGNOSIS — E559 Vitamin D deficiency, unspecified: Secondary | ICD-10-CM

## 2021-08-06 DIAGNOSIS — E538 Deficiency of other specified B group vitamins: Secondary | ICD-10-CM

## 2021-08-06 DIAGNOSIS — E669 Obesity, unspecified: Secondary | ICD-10-CM | POA: Diagnosis not present

## 2021-08-06 DIAGNOSIS — Z0000 Encounter for general adult medical examination without abnormal findings: Secondary | ICD-10-CM

## 2021-08-06 DIAGNOSIS — E785 Hyperlipidemia, unspecified: Secondary | ICD-10-CM

## 2021-08-06 MED ORDER — XHANCE 93 MCG/ACT NA EXHU
1.0000 | INHALANT_SUSPENSION | Freq: Two times a day (BID) | NASAL | 3 refills | Status: AC
Start: 2021-08-06 — End: ?
  Filled 2021-08-06: qty 48, 90d supply, fill #0
  Filled 2021-12-08: qty 48, 90d supply, fill #1
  Filled 2022-05-31: qty 48, 90d supply, fill #2

## 2021-08-06 NOTE — Patient Instructions (Addendum)
F/u in 9 weeks, re eval weight and shingrix #2, call if you need me sooner  Nurse please document vision screen  Covid booster past due  Chem 7 and eGFr, calcium, vit d and Vit B12 5 days before next visit  Dose of ozempic will be increased and new script sent,   Aim for 3 to 4 pound weight loss each month  90 day supply of restasis will be sent  Fasting chem 7 and EGFR, vit D and B12 levels 5 days before next appt  It is important that you exercise regularly at least 30 minutes 5 times a week. If you develop chest pain, have severe difficulty breathing, or feel very tired, stop exercising immediately and seek medical attention   Thanks for choosing [facility], we consider it a privelige to serve you.

## 2021-08-08 DIAGNOSIS — H04123 Dry eye syndrome of bilateral lacrimal glands: Secondary | ICD-10-CM | POA: Insufficient documentation

## 2021-08-08 MED ORDER — SEMAGLUTIDE-WEIGHT MANAGEMENT 2.4 MG/0.75ML ~~LOC~~ SOAJ
2.4000 mg | SUBCUTANEOUS | 3 refills | Status: DC
  Filled 2021-08-08 – 2021-08-09 (×2): qty 3, 30d supply, fill #0
  Filled 2021-08-11: qty 3, 28d supply, fill #0
  Filled 2021-09-09: qty 3, 28d supply, fill #1
  Filled 2021-10-07: qty 3, 28d supply, fill #2
  Filled 2021-10-29: qty 3, 28d supply, fill #3

## 2021-08-08 MED ORDER — RESTASIS 0.05 % OP EMUL
1.0000 [drp] | Freq: Two times a day (BID) | OPHTHALMIC | 1 refills | Status: DC
  Filled 2021-08-08: qty 60, 240d supply, fill #0

## 2021-08-08 NOTE — Assessment & Plan Note (Signed)
°  Patient re-educated about  the importance of commitment to a  minimum of 150 minutes of exercise per week as able.  The importance of healthy food choices with portion control discussed, as well as eating regularly and within a 12 hour window most days. The need to choose "clean , green" food 50 to 75% of the time is discussed, as well as to make water the primary drink and set a goal of 64 ounces water daily.    Weight /BMI 08/06/2021 06/07/2021 03/26/2021  WEIGHT 219 lb 1.3 oz 219 lb 223 lb  HEIGHT 5\' 8"  5\' 8"  5\' 8"   BMI 33.31 kg/m2 33.3 kg/m2 33.91 kg/m2   At a standstill inc ozempic dose to 2.4 mg weekly

## 2021-08-08 NOTE — Progress Notes (Signed)
° ° °  Kristine Lawson     MRN: [id number]      DOB: [birth year]  HPI: Patient is in for annual physical exam. Weight management is also addressed. Recent labs,  are reviewed. Immunization is reviewed , and  updated   PE: BP 129/85    Pulse 100    Ht 5\' 8"  (1.727 m)    Wt 219 lb 1.3 oz (99.4 kg)    SpO2 97%    BMI 33.31 kg/m   Pleasant  female, alert and oriented x 3, in no cardio-pulmonary distress. Afebrile. HEENT No facial trauma or asymetry. Sinuses non tender.  Extra occullar muscles intact.. External ears normal, . Neck: supple, no adenopathy,JVD or thyromegaly.No bruits.  Chest: Clear to ascultation bilaterally.No crackles or wheezes. Non tender to palpation  Cardiovascular system; Heart sounds normal,  S1 and  S2 ,no S3.  No murmur, or thrill. Apical beat not displaced Peripheral pulses normal.  Abdomen: Soft, non tender, no organomegaly or masses. No bruits. Bowel sounds normal. No guarding, tenderness or rebound.      Musculoskeletal exam: Decreased  ROM of spine, hips , shoulders and knees.  deformity ,swelling or crepitus noted. No muscle wasting or atrophy.   Neurologic: Cranial nerves 2 to 12 intact. Power, tone ,sensation and reflexes normal throughout. No disturbance in gait. No tremor.  Skin: Intact, no ulceration, erythema , scaling or rash noted. Pigmentation normal throughout  Psych; Normal mood and affect. Judgement and concentration normal   Assessment & Plan:  Annual physical exam Annual exam as documented. Counseling done  re healthy lifestyle involving commitment to 150 minutes exercise per week, heart healthy diet, and attaining healthy weight.The importance of adequate sleep also discussed.  Changes in health habits are decided on by the patient with goals and time frames  set for achieving them. Immunization and cancer screening needs are specifically addressed at this visit.   Obesity (BMI 30.0-34.9)  Patient re-educated  about  the importance of commitment to a  minimum of 150 minutes of exercise per week as able.  The importance of healthy food choices with portion control discussed, as well as eating regularly and within a 12 hour window most days. The need to choose "clean , green" food 50 to 75% of the time is discussed, as well as to make water the primary drink and set a goal of 64 ounces water daily.    Weight /BMI 08/06/2021 06/07/2021 03/26/2021  WEIGHT 219 lb 1.3 oz 219 lb 223 lb  HEIGHT 5\' 8"  5\' 8"  5\' 8"   BMI 33.31 kg/m2 33.3 kg/m2 33.91 kg/m2   At a standstill inc ozempic dose to 2.4 mg weekly

## 2021-08-08 NOTE — Assessment & Plan Note (Signed)
restasis prescribed

## 2021-08-08 NOTE — Assessment & Plan Note (Addendum)
Annual exam as documented. Counseling done  re healthy lifestyle involving commitment to 150 minutes exercise per week, heart healthy diet, and attaining healthy weight.The importance of adequate sleep also discussed. Changes in health habits are decided on by the patient with goals and time frames  set for achieving them. Immunization and cancer screening needs are specifically addressed at this visit. 

## 2021-08-09 ENCOUNTER — Other Ambulatory Visit: Payer: Self-pay

## 2021-08-09 ENCOUNTER — Other Ambulatory Visit (HOSPITAL_COMMUNITY): Payer: Self-pay

## 2021-08-09 ENCOUNTER — Other Ambulatory Visit: Payer: Self-pay | Admitting: Family Medicine

## 2021-08-11 ENCOUNTER — Other Ambulatory Visit (HOSPITAL_COMMUNITY): Payer: Self-pay

## 2021-08-16 ENCOUNTER — Other Ambulatory Visit (HOSPITAL_COMMUNITY): Payer: Self-pay

## 2021-08-16 DIAGNOSIS — H40013 Open angle with borderline findings, low risk, bilateral: Secondary | ICD-10-CM | POA: Diagnosis not present

## 2021-08-16 DIAGNOSIS — H5213 Myopia, bilateral: Secondary | ICD-10-CM | POA: Diagnosis not present

## 2021-09-09 ENCOUNTER — Other Ambulatory Visit (HOSPITAL_COMMUNITY): Payer: Self-pay

## 2021-10-04 ENCOUNTER — Other Ambulatory Visit: Payer: Self-pay | Admitting: Family Medicine

## 2021-10-04 ENCOUNTER — Other Ambulatory Visit: Payer: Self-pay

## 2021-10-04 ENCOUNTER — Other Ambulatory Visit (HOSPITAL_COMMUNITY): Payer: Self-pay

## 2021-10-04 ENCOUNTER — Encounter: Payer: Self-pay | Admitting: Family Medicine

## 2021-10-04 MED ORDER — RESTASIS 0.05 % OP EMUL
1.0000 [drp] | Freq: Two times a day (BID) | OPHTHALMIC | 1 refills | Status: DC
  Filled 2021-10-04: qty 60, 30d supply, fill #0
  Filled 2022-01-21: qty 60, 30d supply, fill #1

## 2021-10-04 MED ORDER — RESTASIS 0.05 % OP EMUL
1.0000 [drp] | Freq: Two times a day (BID) | OPHTHALMIC | 1 refills | Status: DC
  Filled 2021-10-04: qty 60, 240d supply, fill #0

## 2021-10-07 ENCOUNTER — Other Ambulatory Visit (HOSPITAL_COMMUNITY): Payer: Self-pay

## 2021-10-15 ENCOUNTER — Ambulatory Visit: Payer: 59 | Admitting: Family Medicine

## 2021-10-25 ENCOUNTER — Telehealth: Payer: Self-pay | Admitting: Orthopaedic Surgery

## 2021-10-29 ENCOUNTER — Other Ambulatory Visit (HOSPITAL_COMMUNITY): Payer: Self-pay

## 2021-11-02 ENCOUNTER — Ambulatory Visit: Payer: 59 | Admitting: Family Medicine

## 2021-11-09 ENCOUNTER — Other Ambulatory Visit (HOSPITAL_COMMUNITY): Payer: Self-pay

## 2021-11-09 ENCOUNTER — Ambulatory Visit: Payer: 59 | Admitting: Family Medicine

## 2021-11-09 ENCOUNTER — Encounter: Payer: Self-pay | Admitting: Family Medicine

## 2021-11-09 VITALS — BP 116/73 | HR 81 | Ht 68.0 in | Wt 215.0 lb

## 2021-11-09 DIAGNOSIS — J309 Allergic rhinitis, unspecified: Secondary | ICD-10-CM | POA: Diagnosis not present

## 2021-11-09 DIAGNOSIS — E785 Hyperlipidemia, unspecified: Secondary | ICD-10-CM | POA: Diagnosis not present

## 2021-11-09 DIAGNOSIS — Z23 Encounter for immunization: Secondary | ICD-10-CM

## 2021-11-09 DIAGNOSIS — E669 Obesity, unspecified: Secondary | ICD-10-CM

## 2021-11-09 DIAGNOSIS — E66811 Obesity, class 1: Secondary | ICD-10-CM

## 2021-11-09 DIAGNOSIS — Z1231 Encounter for screening mammogram for malignant neoplasm of breast: Secondary | ICD-10-CM

## 2021-11-09 DIAGNOSIS — I10 Essential (primary) hypertension: Secondary | ICD-10-CM | POA: Diagnosis not present

## 2021-11-09 MED ORDER — SEMAGLUTIDE-WEIGHT MANAGEMENT 2.4 MG/0.75ML ~~LOC~~ SOAJ
2.4000 mg | SUBCUTANEOUS | 1 refills | Status: DC
  Filled 2021-11-09: qty 9, 84d supply, fill #0
  Filled 2022-03-24: qty 3, 28d supply, fill #0
  Filled 2022-04-16: qty 3, 28d supply, fill #1
  Filled 2022-05-12: qty 3, 28d supply, fill #2
  Filled 2022-06-09: qty 3, 28d supply, fill #3
  Filled 2022-08-11: qty 3, 28d supply, fill #4

## 2021-11-09 NOTE — Patient Instructions (Addendum)
F/U in 3.5 ,months, call if you need me sooner ? ?Shingrix #2 today ? ?Congrats on weight loss , keep it up ? ?Please schedule and get mammogram ? ?It is important that you exercise regularly at least 30 minutes 5 times a week. If you develop chest pain, have severe difficulty breathing, or feel very tired, stop exercising immediately and seek medical attention  ? ? ?Think about what you will eat, plan ahead. ?Choose " clean, green, fresh or frozen" over canned, processed or packaged foods which are more sugary, salty and fatty. ?70 to 75% of food eaten should be vegetables and fruit. ?Three meals at set times with snacks allowed between meals, but they must be fruit or vegetables. ?Aim to eat over a 12 hour period , example 7 am to 7 pm, and STOP after  your last meal of the day. ?Drink water,generally about 64 ounces per day, no other drink is as healthy. Fruit juice is best enjoyed in a healthy way, by EATING the fruit. ? ?

## 2021-11-13 ENCOUNTER — Encounter: Payer: Self-pay | Admitting: Family Medicine

## 2021-11-13 MED ORDER — SEMAGLUTIDE-WEIGHT MANAGEMENT 2.4 MG/0.75ML ~~LOC~~ SOAJ
2.4000 mg | SUBCUTANEOUS | 3 refills | Status: DC
  Filled 2021-11-13: qty 3, 28d supply, fill #0
  Filled 2021-12-17: qty 3, 28d supply, fill #1
  Filled 2022-01-21: qty 3, 28d supply, fill #2
  Filled 2022-02-24: qty 3, 28d supply, fill #3

## 2021-11-13 NOTE — Assessment & Plan Note (Signed)
Improving ? ?Patient re-educated about  the importance of commitment to a  minimum of 150 minutes of exercise per week as able. ? ?The importance of healthy food choices with portion control discussed, as well as eating regularly and within a 12 hour window most days. ?The need to choose "clean , green" food 50 to 75% of the time is discussed, as well as to make water the primary drink and set a goal of 64 ounces water daily. ? ?  ? ?  11/09/2021  ?  4:32 PM 08/06/2021  ?  1:08 PM 06/07/2021  ?  8:42 AM  ?Weight /BMI  ?Weight 215 lb 219 lb 1.3 oz 219 lb  ?Height '5\' 8"'$  (1.727 m) '5\' 8"'$  (1.727 m) '5\' 8"'$  (1.727 m)  ?BMI 32.69 kg/m2 33.31 kg/m2 33.3 kg/m2  ? ? ?Continue semaglutide ?

## 2021-11-13 NOTE — Assessment & Plan Note (Signed)
Hyperlipidemia:Low fat diet discussed and encouraged. ? ? ?Lipid Panel  ?Lab Results  ?Component Value Date  ? CHOL 165 04/23/2021  ? HDL 55 04/23/2021  ? [place] 98 04/23/2021  ? TRIG 61 04/23/2021  ? CHOLHDL 3.0 04/23/2021  ? ? ?.con ?

## 2021-11-13 NOTE — Assessment & Plan Note (Signed)
Controlled, no change in medication ?DASH diet and commitment to daily physical activity for a minimum of 30 minutes discussed and encouraged, as a part of hypertension management. ?The importance of attaining a healthy weight is also discussed. ? ? ?  11/09/2021  ?  4:32 PM 08/06/2021  ?  1:08 PM 06/07/2021  ?  8:42 AM 03/26/2021  ?  1:12 PM 11/11/2020  ?  8:05 AM 08/06/2020  ?  8:50 AM 05/18/2020  ?  8:20 AM  ?BP/Weight  ?Systolic BP 395 844 171 278 113 126 126  ?Diastolic BP 73 85 81 70 70 86 77  ?Wt. (Lbs) 215 219.08 219 223 230 228 225  ?BMI 32.69 kg/m2 33.31 kg/m2 33.3 kg/m2 33.91 kg/m2 34.97 kg/m2 34.67 kg/m2 34.21 kg/m2  ? ? ? ? ?

## 2021-11-13 NOTE — Assessment & Plan Note (Signed)
Controlled, no change in medication  

## 2021-11-13 NOTE — Progress Notes (Signed)
? ?Kristine Lawson     MRN: [id number]      DOB: 08/20/1970 ? ? ?HPI ?Kristine Lawson is here for follow up and re-evaluation of chronic medical conditions, medication management and review of any available recent lab and radiology data.  ?Preventive health is updated, specifically  Cancer screening and Immunization.   ?Questions or concerns regarding consultations or procedures which the PT has had in the interim are  addressed. ?The PT denies any adverse reactions to current medications since the last visit.  ?There are no new concerns.  ?There are no specific complaints  ? ?ROS ?Denies recent fever or chills. ?Denies sinus pressure, nasal congestion, ear pain or sore throat. ?Denies chest congestion, productive cough or wheezing. ?Denies chest pains, palpitations and leg swelling ?Denies abdominal pain, nausea, vomiting,diarrhea or constipation.   ?Denies dysuria, frequency, hesitancy or incontinence. ?Denies joint pain, swelling and limitation in mobility. ?Denies headaches, seizures, numbness, or tingling. ?Denies depression, anxiety or insomnia. ?Denies skin break down or rash. ? ? ?PE ? ?BP 116/73   Pulse 81   Ht '5\' 8"'$  (1.727 m)   Wt 215 lb (97.5 kg)   SpO2 97%   BMI 32.69 kg/m?  ? ?Patient alert and oriented and in no cardiopulmonary distress. ? ?HEENT: No facial asymmetry, EOMI,     Neck supple . ? ?Chest: Clear to auscultation bilaterally. ? ?CVS: S1, S2 no murmurs, no S3.Regular rate. ? ?ABD: Soft non tender.  ? ?Ext: No edema ? ?MS: Adequate ROM spine, shoulders, hips and knees. ? ?Skin: Intact, no ulcerations or rash noted. ? ?Psych: Good eye contact, normal affect. Memory intact not anxious or depressed appearing. ? ?CNS: CN 2-12 intact, power,  normal throughout.no focal deficits noted. ? ? ?Assessment & Plan ? ?HYPERTENSION, BENIGN ESSENTIAL ?Controlled, no change in medication ?DASH diet and commitment to daily physical activity for a minimum of 30 minutes discussed and encouraged, as a part of  hypertension management. ?The importance of attaining a healthy weight is also discussed. ? ? ?  11/09/2021  ?  4:32 PM 08/06/2021  ?  1:08 PM 06/07/2021  ?  8:42 AM 03/26/2021  ?  1:12 PM 11/11/2020  ?  8:05 AM 08/06/2020  ?  8:50 AM 05/18/2020  ?  8:20 AM  ?BP/Weight  ?Systolic BP 952 841 324 401 113 126 126  ?Diastolic BP 73 85 81 70 70 86 77  ?Wt. (Lbs) 215 219.08 219 223 230 228 225  ?BMI 32.69 kg/m2 33.31 kg/m2 33.3 kg/m2 33.91 kg/m2 34.97 kg/m2 34.67 kg/m2 34.21 kg/m2  ? ? ? ? ? ?Dyslipidemia ?Hyperlipidemia:Low fat diet discussed and encouraged. ? ? ?Lipid Panel  ?Lab Results  ?Component Value Date  ? CHOL 165 04/23/2021  ? HDL 55 04/23/2021  ? [place] 98 04/23/2021  ? TRIG 61 04/23/2021  ? CHOLHDL 3.0 04/23/2021  ? ? ?.con ? ?Obesity (BMI 30.0-34.9) ?Improving ? ?Patient re-educated about  the importance of commitment to a  minimum of 150 minutes of exercise per week as able. ? ?The importance of healthy food choices with portion control discussed, as well as eating regularly and within a 12 hour window most days. ?The need to choose "clean , green" food 50 to 75% of the time is discussed, as well as to make water the primary drink and set a goal of 64 ounces water daily. ? ?  ? ?  11/09/2021  ?  4:32 PM 08/06/2021  ?  1:08 PM 06/07/2021  ?  8:42 AM  ?Weight /BMI  ?Weight 215 lb 219 lb 1.3 oz 219 lb  ?Height '5\' 8"'$  (1.727 m) '5\' 8"'$  (1.727 m) '5\' 8"'$  (1.727 m)  ?BMI 32.69 kg/m2 33.31 kg/m2 33.3 kg/m2  ? ? ?Continue semaglutide ? ?Allergic rhinitis ?Controlled, no change in medication ? ? ?

## 2021-11-15 ENCOUNTER — Other Ambulatory Visit (HOSPITAL_COMMUNITY): Payer: Self-pay

## 2021-11-19 ENCOUNTER — Ambulatory Visit (INDEPENDENT_AMBULATORY_CARE_PROVIDER_SITE_OTHER): Payer: 59

## 2021-11-19 ENCOUNTER — Encounter: Payer: Self-pay | Admitting: Orthopaedic Surgery

## 2021-11-19 ENCOUNTER — Ambulatory Visit: Payer: 59 | Admitting: Orthopaedic Surgery

## 2021-11-19 DIAGNOSIS — M542 Cervicalgia: Secondary | ICD-10-CM | POA: Diagnosis not present

## 2021-11-19 DIAGNOSIS — M25511 Pain in right shoulder: Secondary | ICD-10-CM

## 2021-11-19 DIAGNOSIS — G8929 Other chronic pain: Secondary | ICD-10-CM

## 2021-11-19 NOTE — Progress Notes (Signed)
? ?Office Visit Note ?  ?Patient: Kristine Lawson           ?Date of Birth: Feb 10, 1971           ?MRN: [id number] ?Visit Date: 11/19/2021 ?             ?Requested by: Fayrene Helper, MD ?[address], [address] ?[place],  [place] [address] ?PCP: Fayrene Helper, MD ? ? ?Assessment & Plan: ?Visit Diagnoses:  ?1. Chronic right shoulder pain   ?2. Neck pain   ? ? 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  Treatment modalities reviewed in detail.  She will use heat and Lidoderm patch and continue using Voltaren gel.  She would like to hold off on physical therapy for now.  She may consider seeing an acupuncturist or massage therapist at some point.  Follow-up as needed. ? ?Follow-Up Instructions: No follow-ups on file.  ? ?Orders:  ?Orders Placed This Encounter  ?Procedures  ? XR Shoulder Right  ? XR Cervical Spine 2 or 3 views  ? ?No orders of the defined types were placed in this encounter. ? ? ? ? Procedures: ?No procedures performed ? ? ?Clinical Data: ?No additional findings. ? ? ?Subjective: ?Chief Complaint  ?Patient presents with  ? Right Shoulder - Pain  ? Neck - Pain  ? ? ?HPI ? ?Kristine Lawson is a 51 year old who comes in with right-sided neck pain for about 6 weeks.  Felt a pop in her shoulder region when pushing herself out of bed.  She has had pain in that area since.  Denies any radicular symptoms.  Occasional radiation into the neck.  Denies any numbness and tingling. ? ?Review of Systems  ?Constitutional: Negative.   ?HENT: Negative.    ?Eyes: Negative.   ?Respiratory: Negative.    ?Cardiovascular: Negative.   ?Endocrine: Negative.   ?Musculoskeletal: Negative.   ?Neurological: Negative.   ?Hematological: Negative.   ?Psychiatric/Behavioral: Negative.    ?All other systems reviewed and are negative. ? ? ?Objective: ?Vital Signs: There were no vitals taken for this visit. ? ?Physical Exam ?Vitals and nursing note reviewed.  ?Constitutional:   ?   Appearance:  She is well-developed.  ?Pulmonary:  ?   Effort: Pulmonary effort is normal.  ?Skin: ?   General: Skin is warm.  ?   Capillary Refill: Capillary refill takes less than 2 seconds.  ?Neurological:  ?   Mental Status: She is alert and oriented to person, place, and time.  ?Psychiatric:     ?   Behavior: Behavior normal.     ?   Thought Content: Thought content normal.     ?   Judgment: Judgment normal.  ? ? ?Ortho Exam ? ?Examination of cervical spine and shoulder are unremarkable.  She is tender to the muscle group in the trapezius and the medial scapular region. ? ?Specialty Comments:  ?No specialty comments available. ? ?Imaging: ?XR Cervical Spine 2 or 3 views ? ?Result Date: 11/19/2021 ?No acute or structural abnormalities ? ?XR Shoulder Right ? ?Result Date: 11/19/2021 ?No acute or structural abnormalities  ? ? ?PMFS History: ?Patient Active Problem List  ? Diagnosis Date Noted  ? Dry eyes, bilateral 08/08/2021  ? Low back pain with left-sided sciatica 03/28/2021  ? 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 Nephrolithiasis 06/01/2020  ? Ureteral calculus 05/07/2020  ? Allergic rhinitis 01/15/2020  ? Genetic testing 12/16/2019  ? Family history of breast cancer   ?  Family history of prostate cancer   ? Family history of cancer of gallbladder   ? Family history of cervical cancer   ? Family history of lung cancer   ? Family history of throat cancer   ? Tendinitis 11/13/2019  ? Trapezius strain 11/13/2019  ? Family history of cancer 10/28/2019  ? Arthritis 06/05/2019  ? 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 10/01/2018  ? Muscle spasm 02/04/2018  ? 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 Insomnia 04/05/2014  ? Dyslipidemia 04/05/2014  ? Metabolic syndrome X 16/04/9603  ? Vitamin D deficiency 04/05/2014  ? Psychosocial stressors 12/21/2012  ? Obesity (BMI 30.0-34.9) 03/05/2011  ? PES PLANUS 05/01/2008  ? Acne vulgaris 09/28/2007  ? HYPERTENSION, BENIGN ESSENTIAL 10/24/2006  ? ?Past Medical History:  ?Diagnosis Date  ? Allergy   ?  Anemia   ? IDA  ? Arthritis   ? Arthritis   ? Family history of breast cancer   ? Family history of cancer of gallbladder   ? Family history of cervical cancer   ? Family history of lung cancer   ? Family history of prostate cancer   ? Family history of throat cancer   ? GERD (gastroesophageal reflux disease)   ? History of stomach ulcers   ? Hypertension   ? Insomnia   ? meds needed daily to achieve 6+ hours sleep.  ? Ureteral stone with hydronephrosis 05/09/2020  ?  ?Family History  ?Problem Relation Age of Onset  ? Hypertension Mother   ? Diabetes Mother   ? Kidney disease Mother   ? Hyperlipidemia Mother   ? Colon cancer Mother 27  ?     highly suspious of colon mass  ? Breast cancer Mother 42  ? Kidney disease Father   ? Stroke Father   ? Heart disease Father   ? Hyperlipidemia Father   ? Heart attack Father   ? Diverticulitis Father   ? Hypertension Brother   ? Diabetes Brother   ? Healthy Sister   ? Other Daughter   ?     hypertension  ? Kidney disease Daughter   ? Blindness Daughter   ? Healthy Daughter   ? Healthy Son   ? Cancer Maternal Aunt 66  ?     gallbladder cancer  ? Breast cancer Paternal Aunt   ?     dx. in her 75s  ? Cervical cancer Maternal Grandmother   ?     dx. in her 76s  ? Prostate cancer Maternal Grandfather   ?     metastatic  ? Heart attack Paternal Grandmother   ? Hypertension Paternal Grandmother   ? Stroke Paternal Grandmother   ? Lung cancer Paternal Grandfather   ? Throat cancer Paternal Grandfather   ? Breast cancer Paternal Aunt   ?     dx. in her 9s  ? Breast cancer Paternal Aunt   ?     dx. in her 46s  ? Throat cancer Paternal Uncle   ? Lung cancer Paternal Uncle   ? Breast cancer Other   ?     dx. 58s or 80s (mother's first cousins x3)  ? SIDS Maternal Aunt   ? Prostate cancer Maternal Uncle   ?     dx. in his 70s, not metastatic  ? Prostate cancer Maternal Uncle   ?     dx. in his 76s, not metastatic  ? Breast cancer Cousin   ?     dx. late 68s (  paternal cousin)  ? Breast  cancer Other   ?     dx. 40s/early 70s (maternal second cousins x2)  ?  ?Past Surgical History:  ?Procedure Laterality Date  ? ABLATION    ? uterine ablation  ? APPENDECTOMY    ? BIOPSY  12/18/2019  ? Procedure: BIOPSY;  Surgeon: Rogene Houston, MD;  [facility]: [facility];  Service: Endoscopy;;  gastric  ? BREATH TEK H PYLORI N/A 11/18/2014  ? Procedure: BREATH TEK H PYLORI;  Surgeon: Greer Pickerel, MD;  Location: [facility];  Service: General;  Laterality: N/A;  ? CHOLECYSTECTOMY    ? COLONOSCOPY N/A 12/18/2019  ? Procedure: COLONOSCOPY;  Surgeon: Rogene Houston, MD;  Location: [facility];  Service: Endoscopy;  Laterality: N/A;  930  ? CYSTOSCOPY W/ RETROGRADES Right 05/07/2020  ? Procedure: CYSTOSCOPY WITH RIGHT RETROGRADE PYELOGRAM;  Surgeon: Cleon Gustin, MD;  Location: [facility];  Service: Urology;  Laterality: Right;  ? CYSTOSCOPY W/ URETERAL STENT PLACEMENT Right 05/07/2020  ? Procedure: CYSTOSCOPY WITH RIGHT URETERAL STENT REPLACEMENT;  Surgeon: Cleon Gustin, MD;  Location: [facility];  Service: Urology;  Laterality: Right;  ? ESOPHAGOGASTRODUODENOSCOPY N/A 10/01/2018  ? Procedure: ESOPHAGOGASTRODUODENOSCOPY (EGD);  Surgeon: Rogene Houston, MD;  [facility]: [facility];  Service: Endoscopy;  Laterality: N/A;  ? ESOPHAGOGASTRODUODENOSCOPY N/A 12/18/2019  ? Procedure: ESOPHAGOGASTRODUODENOSCOPY (EGD);  Surgeon: Rogene Houston, MD;  [facility]: [facility];  Service: Endoscopy;  Laterality: N/A;  ? EXTRACORPOREAL SHOCK WAVE LITHOTRIPSY Right 05/12/2020  ? Procedure: EXTRACORPOREAL SHOCK WAVE LITHOTRIPSY (ESWL);  Surgeon: Cleon Gustin, MD;  Location: [facility];  Service: Urology;  Laterality: Right;  ? HERNIA REPAIR N/A   ? [employer] 11/10/2020  ? LAPAROSCOPIC ROUX-EN-Y GASTRIC BYPASS WITH HIATAL HERNIA REPAIR N/A 02/10/2015  ? Procedure: LAPAROSCOPIC ROUX-EN-Y GASTRIC BYPASS ;  Surgeon: Greer Pickerel, MD;  Location: [facility];  Service: General;  Laterality: N/A;  ? TUBAL LIGATION    ? UPPER GI  ENDOSCOPY N/A 02/10/2015  ? Procedure: UPPER GI ENDOSCOPY;  Surgeon: Greer Pickerel, MD;  Location: [facility];  Service: General;  Laterality: N/A;  ? ?Social History  ? ?Occupational History  ? Occupation: CARDIOLOGY

## 2021-11-20 ENCOUNTER — Other Ambulatory Visit (HOSPITAL_COMMUNITY): Payer: Self-pay

## 2021-12-06 ENCOUNTER — Ambulatory Visit (HOSPITAL_COMMUNITY): Payer: 59

## 2021-12-06 NOTE — Telephone Encounter (Signed)
error 

## 2021-12-08 ENCOUNTER — Other Ambulatory Visit: Payer: Self-pay | Admitting: Family Medicine

## 2021-12-08 ENCOUNTER — Other Ambulatory Visit (HOSPITAL_COMMUNITY): Payer: Self-pay

## 2021-12-08 DIAGNOSIS — K219 Gastro-esophageal reflux disease without esophagitis: Secondary | ICD-10-CM

## 2021-12-10 ENCOUNTER — Other Ambulatory Visit (HOSPITAL_COMMUNITY): Payer: Self-pay

## 2021-12-15 ENCOUNTER — Other Ambulatory Visit (HOSPITAL_COMMUNITY): Payer: Self-pay

## 2021-12-17 ENCOUNTER — Other Ambulatory Visit: Payer: Self-pay

## 2021-12-17 ENCOUNTER — Other Ambulatory Visit (HOSPITAL_COMMUNITY): Payer: Self-pay

## 2021-12-17 ENCOUNTER — Encounter: Payer: Self-pay | Admitting: Family Medicine

## 2021-12-17 MED ORDER — PANTOPRAZOLE SODIUM 40 MG PO TBEC
40.0000 mg | DELAYED_RELEASE_TABLET | Freq: Every day | ORAL | 2 refills | Status: DC
Start: 2021-12-17 — End: 2023-03-25
  Filled 2021-12-17: qty 90, 90d supply, fill #0
  Filled 2022-03-24: qty 90, 90d supply, fill #1
  Filled 2022-08-31: qty 90, 90d supply, fill #2

## 2021-12-22 ENCOUNTER — Other Ambulatory Visit (HOSPITAL_COMMUNITY): Payer: Self-pay

## 2021-12-27 ENCOUNTER — Ambulatory Visit (HOSPITAL_COMMUNITY): Admission: RE | Admit: 2021-12-27 | Payer: 59 | Source: Ambulatory Visit

## 2021-12-27 ENCOUNTER — Encounter (HOSPITAL_COMMUNITY): Payer: Self-pay

## 2021-12-28 ENCOUNTER — Encounter: Payer: Self-pay | Admitting: Family Medicine

## 2021-12-28 ENCOUNTER — Other Ambulatory Visit: Payer: Self-pay

## 2021-12-28 DIAGNOSIS — N644 Mastodynia: Secondary | ICD-10-CM

## 2021-12-29 ENCOUNTER — Encounter (HOSPITAL_COMMUNITY): Payer: Self-pay

## 2021-12-29 ENCOUNTER — Ambulatory Visit (HOSPITAL_COMMUNITY)
Admission: RE | Admit: 2021-12-29 | Discharge: 2021-12-29 | Disposition: A | Discharge status: Home / Self Care | Payer: 59 | Source: Ambulatory Visit | Attending: Family Medicine | Admitting: Family Medicine

## 2021-12-29 DIAGNOSIS — N644 Mastodynia: Secondary | ICD-10-CM

## 2021-12-29 DIAGNOSIS — N6489 Other specified disorders of breast: Secondary | ICD-10-CM | POA: Diagnosis not present

## 2021-12-29 DIAGNOSIS — R928 Other abnormal and inconclusive findings on diagnostic imaging of breast: Secondary | ICD-10-CM | POA: Diagnosis not present

## 2021-12-31 ENCOUNTER — Encounter: Payer: Self-pay | Admitting: *Deleted

## 2021-12-31 ENCOUNTER — Other Ambulatory Visit: Payer: Self-pay | Admitting: Family Medicine

## 2021-12-31 ENCOUNTER — Other Ambulatory Visit (HOSPITAL_COMMUNITY): Payer: Self-pay

## 2021-12-31 MED ORDER — TRAZODONE HCL 50 MG PO TABS
ORAL_TABLET | Freq: Every day | ORAL | 1 refills | Status: DC
  Filled 2021-12-31: qty 90, 90d supply, fill #0
  Filled 2022-05-03: qty 90, 90d supply, fill #1

## 2022-01-21 ENCOUNTER — Other Ambulatory Visit (HOSPITAL_COMMUNITY): Payer: Self-pay

## 2022-01-21 ENCOUNTER — Other Ambulatory Visit: Payer: Self-pay | Admitting: Family Medicine

## 2022-01-21 DIAGNOSIS — E559 Vitamin D deficiency, unspecified: Secondary | ICD-10-CM

## 2022-01-21 MED ORDER — OLMESARTAN MEDOXOMIL-HCTZ 20-12.5 MG PO TABS
1.0000 | ORAL_TABLET | Freq: Every day | ORAL | 3 refills | Status: DC
  Filled 2022-01-21: qty 90, 90d supply, fill #0
  Filled 2022-05-03: qty 90, 90d supply, fill #1
  Filled 2022-07-29: qty 90, 90d supply, fill #2
  Filled 2022-10-31: qty 90, 90d supply, fill #3

## 2022-01-21 MED ORDER — ERGOCALCIFEROL 1.25 MG (50000 UT) PO CAPS
50000.0000 [IU] | ORAL_CAPSULE | ORAL | 1 refills | Status: DC
  Filled 2022-01-21: qty 12, 84d supply, fill #0
  Filled 2022-10-04: qty 12, 84d supply, fill #1

## 2022-01-24 ENCOUNTER — Other Ambulatory Visit (HOSPITAL_COMMUNITY): Payer: Self-pay

## 2022-01-24 ENCOUNTER — Encounter (HOSPITAL_COMMUNITY): Payer: Self-pay | Admitting: Pharmacist

## 2022-01-26 ENCOUNTER — Other Ambulatory Visit (HOSPITAL_COMMUNITY): Payer: Self-pay

## 2022-01-26 ENCOUNTER — Encounter (HOSPITAL_COMMUNITY): Payer: Self-pay | Admitting: Pharmacist

## 2022-01-31 ENCOUNTER — Encounter: Payer: Self-pay | Admitting: Family Medicine

## 2022-02-01 ENCOUNTER — Other Ambulatory Visit (HOSPITAL_COMMUNITY): Payer: Self-pay

## 2022-02-01 ENCOUNTER — Other Ambulatory Visit: Payer: Self-pay | Admitting: Family Medicine

## 2022-02-01 MED ORDER — GABAPENTIN 100 MG PO CAPS
ORAL_CAPSULE | ORAL | 0 refills | Status: DC
  Filled 2022-02-01: qty 180, 90d supply, fill #0

## 2022-02-24 ENCOUNTER — Other Ambulatory Visit (HOSPITAL_COMMUNITY): Payer: Self-pay

## 2022-03-24 ENCOUNTER — Other Ambulatory Visit (HOSPITAL_COMMUNITY): Payer: Self-pay

## 2022-03-24 ENCOUNTER — Other Ambulatory Visit: Payer: Self-pay | Admitting: Family Medicine

## 2022-03-24 DIAGNOSIS — J309 Allergic rhinitis, unspecified: Secondary | ICD-10-CM

## 2022-03-24 MED ORDER — LEVOCETIRIZINE DIHYDROCHLORIDE 5 MG PO TABS
5.0000 mg | ORAL_TABLET | Freq: Every evening | ORAL | 5 refills | Status: DC
  Filled 2022-03-24: qty 30, 30d supply, fill #0
  Filled 2022-05-01: qty 30, 30d supply, fill #1

## 2022-03-25 ENCOUNTER — Other Ambulatory Visit: Payer: Self-pay

## 2022-03-25 ENCOUNTER — Encounter: Payer: Self-pay | Admitting: Family Medicine

## 2022-03-25 DIAGNOSIS — I10 Essential (primary) hypertension: Secondary | ICD-10-CM

## 2022-03-25 DIAGNOSIS — E538 Deficiency of other specified B group vitamins: Secondary | ICD-10-CM | POA: Diagnosis not present

## 2022-03-25 DIAGNOSIS — E785 Hyperlipidemia, unspecified: Secondary | ICD-10-CM | POA: Diagnosis not present

## 2022-03-26 LAB — CMP14+EGFR
ALT: 21 IU/L (ref 0–32)
AST: 22 IU/L (ref 0–40)
Albumin/Globulin Ratio: 1.7 (ref 1.2–2.2)
Albumin: 4 g/dL (ref 3.9–4.9)
Alkaline Phosphatase: 49 IU/L (ref 44–121)
BUN/Creatinine Ratio: 14 (ref 9–23)
BUN: 11 mg/dL (ref 6–24)
Bilirubin Total: 0.4 mg/dL (ref 0.0–1.2)
CO2: 26 mmol/L (ref 20–29)
Calcium: 9.2 mg/dL (ref 8.7–10.2)
Chloride: 102 mmol/L (ref 96–106)
Creatinine, Ser: 0.81 mg/dL (ref 0.57–1.00)
Globulin, Total: 2.4 g/dL (ref 1.5–4.5)
Glucose: 79 mg/dL (ref 70–99)
Potassium: 4.8 mmol/L (ref 3.5–5.2)
Sodium: 140 mmol/L (ref 134–144)
Total Protein: 6.4 g/dL (ref 6.0–8.5)
eGFR: 88 mL/min/{1.73_m2} (ref >59)

## 2022-03-26 LAB — LIPID PANEL
Chol/HDL Ratio: 2.4 ratio (ref 0.0–4.4)
Cholesterol, Total: 169 mg/dL (ref 100–199)
HDL: 69 mg/dL (ref >39)
LDL Chol Calc (NIH): 91 mg/dL (ref 0–99)
Triglycerides: 43 mg/dL (ref 0–149)
VLDL Cholesterol Cal: 9 mg/dL (ref 5–40)

## 2022-03-26 LAB — VITAMIN B12: Vitamin B-12: 698 pg/mL (ref 232–1245)

## 2022-03-26 LAB — TSH: TSH: 0.794 u[IU]/mL (ref 0.450–4.500)

## 2022-04-05 ENCOUNTER — Other Ambulatory Visit: Payer: Self-pay | Admitting: Family Medicine

## 2022-04-05 ENCOUNTER — Other Ambulatory Visit (HOSPITAL_COMMUNITY): Payer: Self-pay

## 2022-04-05 MED ORDER — ACYCLOVIR 400 MG PO TABS
ORAL_TABLET | ORAL | 3 refills | Status: DC
  Filled 2022-04-05: qty 90, 90d supply, fill #0
  Filled 2022-07-07: qty 90, 90d supply, fill #1
  Filled 2022-10-04: qty 90, 90d supply, fill #2
  Filled 2022-12-23: qty 90, 90d supply, fill #3

## 2022-04-05 MED ORDER — CYCLOBENZAPRINE HCL 10 MG PO TABS
10.0000 mg | ORAL_TABLET | Freq: Every day | ORAL | 0 refills | Status: DC | PRN
  Filled 2022-04-05: qty 30, 30d supply, fill #0

## 2022-04-05 MED ORDER — HYDROXYZINE PAMOATE 50 MG PO CAPS
ORAL_CAPSULE | ORAL | 3 refills | Status: DC
  Filled 2022-04-05: qty 90, 90d supply, fill #0
  Filled 2022-07-14: qty 90, 90d supply, fill #1
  Filled 2022-10-04: qty 90, 90d supply, fill #2
  Filled 2023-01-03: qty 90, 90d supply, fill #3

## 2022-04-08 ENCOUNTER — Encounter: Payer: Self-pay | Admitting: Family Medicine

## 2022-04-08 ENCOUNTER — Ambulatory Visit: Payer: 59 | Admitting: Family Medicine

## 2022-04-08 VITALS — BP 116/80 | HR 84 | Ht 68.0 in | Wt 209.1 lb

## 2022-04-08 DIAGNOSIS — L700 Acne vulgaris: Secondary | ICD-10-CM | POA: Diagnosis not present

## 2022-04-08 DIAGNOSIS — E66811 Obesity, class 1: Secondary | ICD-10-CM

## 2022-04-08 DIAGNOSIS — F5104 Psychophysiologic insomnia: Secondary | ICD-10-CM

## 2022-04-08 DIAGNOSIS — J309 Allergic rhinitis, unspecified: Secondary | ICD-10-CM

## 2022-04-08 DIAGNOSIS — E669 Obesity, unspecified: Secondary | ICD-10-CM | POA: Diagnosis not present

## 2022-04-08 DIAGNOSIS — I10 Essential (primary) hypertension: Secondary | ICD-10-CM | POA: Diagnosis not present

## 2022-04-08 NOTE — Patient Instructions (Signed)
F/Uin 5 months, call if you need me sooner'   Congrats on excellent  health, keep it up  It is important that you exercise regularly at least 30 minutes 5 times a week. If you develop chest pain, have severe difficulty breathing, or feel very tired, stop exercising immediately and seek medical attention   Think about what you will eat, plan ahead. Choose " clean, green, fresh or frozen" over canned, processed or packaged foods which are more sugary, salty and fatty. 70 to 75% of food eaten should be vegetables and fruit. Three meals at set times with snacks allowed between meals, but they must be fruit or vegetables. Aim to eat over a 12 hour period , example 7 am to 7 pm, and STOP after  your last meal of the day. Drink water,generally about 64 ounces per day, no other drink is as healthy. Fruit juice is best enjoyed in a healthy way, by EATING the fruit.  Thanks for choosing [facility], we consider it a privelige to serve you.

## 2022-04-11 ENCOUNTER — Encounter: Payer: Self-pay | Admitting: Family Medicine

## 2022-04-11 MED ORDER — SEMAGLUTIDE-WEIGHT MANAGEMENT 2.4 MG/0.75ML ~~LOC~~ SOAJ
2.4000 mg | SUBCUTANEOUS | 3 refills | Status: DC
  Filled 2022-04-11 – 2022-09-07 (×3): qty 3, 28d supply, fill #0

## 2022-04-11 NOTE — Assessment & Plan Note (Signed)
Controlled, no change in medication  

## 2022-04-11 NOTE — Assessment & Plan Note (Signed)
Improved , continue semafglutid for an additional 4 months and re evaluate  Patient re-educated about  the importance of commitment to a  minimum of 150 minutes of exercise per week as able.  The importance of healthy food choices with portion control discussed, as well as eating regularly and within a 12 hour window most days. The need to choose "clean , green" food 50 to 75% of the time is discussed, as well as to make water the primary drink and set a goal of 64 ounces water daily.       04/08/2022    1:28 PM 11/09/2021    4:32 PM 08/06/2021    1:08 PM  Weight /BMI  Weight 209 lb 1.9 oz 215 lb 219 lb 1.3 oz  Height '5\' 8"'$  (1.727 m) '5\' 8"'$  (1.727 m) '5\' 8"'$  (1.727 m)  BMI 31.8 kg/m2 32.69 kg/m2 33.31 kg/m2

## 2022-04-11 NOTE — Progress Notes (Signed)
Kristine Lawson     MRN: [id number]      DOB: 07-06-1971   HPI Kristine Lawson is here for follow up and re-evaluation of chronic medical conditions, medication management and review of any available recent lab and radiology data.  Preventive health is updated, specifically  Cancer screening and Immunization.   Questions or concerns regarding consultations or procedures which the PT has had in the interim are  addressed. The PT denies any adverse reactions to current medications since the last visit.  There are no new concerns.  There are no specific complaints   ROS Denies recent fever or chills. Denies sinus pressure, nasal congestion, ear pain or sore throat. Denies chest congestion, productive cough or wheezing. Denies chest pains, palpitations and leg swelling Denies abdominal pain, nausea, vomiting,diarrhea or constipation.   Denies dysuria, frequency, hesitancy or incontinence. Denies joint pain, swelling and limitation in mobility. Denies headaches, seizures, numbness, or tingling. Denies depression, anxiety or insomnia. Denies skin break down or rash.   PE  BP 116/80 (BP Location: Right Arm, Patient Position: Sitting, Cuff Size: Large)   Pulse 84   Ht '5\' 8"'$  (1.727 m)   Wt 209 lb 1.9 oz (94.9 kg)   SpO2 97%   BMI 31.80 kg/m   Patient alert and oriented and in no cardiopulmonary distress.  HEENT: No facial asymmetry, EOMI,     Neck supple .  Chest: Clear to auscultation bilaterally.  CVS: S1, S2 no murmurs, no S3.Regular rate.  ABD: Soft non tender.   Ext: No edema  MS: Adequate ROM spine, shoulders, hips and knees.  Skin: Intact, no ulcerations or rash noted.  Psych: Good eye contact, normal affect. Memory intact not anxious or depressed appearing.  CNS: CN 2-12 intact, power,  normal throughout.no focal deficits noted.   Assessment & Plan  Acne vulgaris Controlled, no change in medication   Allergic rhinitis Controlled, no change in  medication   HYPERTENSION, BENIGN ESSENTIAL Controlled, no change in medication DASH diet and commitment to daily physical activity for a minimum of 30 minutes discussed and encouraged, as a part of hypertension management. The importance of attaining a healthy weight is also discussed.     04/08/2022    1:28 PM 11/09/2021    4:32 PM 08/06/2021    1:08 PM 06/07/2021    8:42 AM 03/26/2021    1:12 PM 11/11/2020    8:05 AM 08/06/2020    8:50 AM  BP/Weight  Systolic BP 623 762 831 517 616 073 710  Diastolic BP 80 73 85 81 70 70 86  Wt. (Lbs) 209.12 215 219.08 219 223 230 228  BMI 31.8 kg/m2 32.69 kg/m2 33.31 kg/m2 33.3 kg/m2 33.91 kg/m2 34.97 kg/m2 34.67 kg/m2       Obesity (BMI 30.0-34.9) Improved , continue semafglutid for an additional 4 months and re evaluate  Patient re-educated about  the importance of commitment to a  minimum of 150 minutes of exercise per week as able.  The importance of healthy food choices with portion control discussed, as well as eating regularly and within a 12 hour window most days. The need to choose "clean , green" food 50 to 75% of the time is discussed, as well as to make water the primary drink and set a goal of 64 ounces water daily.       04/08/2022    1:28 PM 11/09/2021    4:32 PM 08/06/2021    1:08 PM  Weight /BMI  Weight  209 lb 1.9 oz 215 lb 219 lb 1.3 oz  Height '5\' 8"'$  (1.727 m) '5\' 8"'$  (1.727 m) '5\' 8"'$  (1.727 m)  BMI 31.8 kg/m2 32.69 kg/m2 33.31 kg/m2      Insomnia Sleep hygiene reviewed and written information offered also. Prescription sent for  medication needed.

## 2022-04-11 NOTE — Assessment & Plan Note (Signed)
Sleep hygiene reviewed and written information offered also. Prescription sent for  medication needed.  

## 2022-04-11 NOTE — Assessment & Plan Note (Signed)
Controlled, no change in medication DASH diet and commitment to daily physical activity for a minimum of 30 minutes discussed and encouraged, as a part of hypertension management. The importance of attaining a healthy weight is also discussed.     04/08/2022    1:28 PM 11/09/2021    4:32 PM 08/06/2021    1:08 PM 06/07/2021    8:42 AM 03/26/2021    1:12 PM 11/11/2020    8:05 AM 08/06/2020    8:50 AM  BP/Weight  Systolic BP 884 166 063 016 010 932 355  Diastolic BP 80 73 85 81 70 70 86  Wt. (Lbs) 209.12 215 219.08 219 223 230 228  BMI 31.8 kg/m2 32.69 kg/m2 33.31 kg/m2 33.3 kg/m2 33.91 kg/m2 34.97 kg/m2 34.67 kg/m2

## 2022-04-12 ENCOUNTER — Other Ambulatory Visit (HOSPITAL_COMMUNITY): Payer: Self-pay

## 2022-04-12 ENCOUNTER — Telehealth: Payer: Self-pay | Admitting: Family Medicine

## 2022-04-12 ENCOUNTER — Other Ambulatory Visit: Payer: Self-pay | Admitting: Family Medicine

## 2022-04-12 DIAGNOSIS — I10 Essential (primary) hypertension: Secondary | ICD-10-CM

## 2022-04-12 DIAGNOSIS — E559 Vitamin D deficiency, unspecified: Secondary | ICD-10-CM

## 2022-04-12 NOTE — Telephone Encounter (Signed)
No f/u in system, let herknow needs Jan , that is 4 month not 5 month f/u as I had said. Needs fasting cheem 7 and EGFr, CBc and vit D , 3 to 5  days before appt also, pls order

## 2022-04-12 NOTE — Telephone Encounter (Signed)
Pt aware and labs ordered

## 2022-04-16 ENCOUNTER — Other Ambulatory Visit (HOSPITAL_COMMUNITY): Payer: Self-pay

## 2022-05-01 ENCOUNTER — Other Ambulatory Visit: Payer: Self-pay | Admitting: Family Medicine

## 2022-05-02 ENCOUNTER — Other Ambulatory Visit (HOSPITAL_COMMUNITY): Payer: Self-pay

## 2022-05-02 MED ORDER — GABAPENTIN 100 MG PO CAPS
100.0000 mg | ORAL_CAPSULE | Freq: Every day | ORAL | 0 refills | Status: DC
  Filled 2022-05-02: qty 180, 90d supply, fill #0

## 2022-05-04 ENCOUNTER — Other Ambulatory Visit (HOSPITAL_COMMUNITY): Payer: Self-pay

## 2022-05-12 ENCOUNTER — Encounter: Payer: Self-pay | Admitting: Family Medicine

## 2022-05-12 ENCOUNTER — Other Ambulatory Visit (HOSPITAL_COMMUNITY): Payer: Self-pay

## 2022-05-26 ENCOUNTER — Other Ambulatory Visit (HOSPITAL_COMMUNITY): Payer: Self-pay

## 2022-05-30 ENCOUNTER — Encounter: Payer: Self-pay | Admitting: Family Medicine

## 2022-05-31 ENCOUNTER — Encounter: Payer: Self-pay | Admitting: Family Medicine

## 2022-06-01 ENCOUNTER — Other Ambulatory Visit: Payer: Self-pay

## 2022-06-01 ENCOUNTER — Other Ambulatory Visit (HOSPITAL_COMMUNITY): Payer: Self-pay

## 2022-06-01 DIAGNOSIS — J309 Allergic rhinitis, unspecified: Secondary | ICD-10-CM

## 2022-06-01 MED ORDER — LEVOCETIRIZINE DIHYDROCHLORIDE 5 MG PO TABS
5.0000 mg | ORAL_TABLET | Freq: Every evening | ORAL | 1 refills | Status: DC
  Filled 2022-06-01: qty 90, 90d supply, fill #0
  Filled 2022-08-31: qty 90, 90d supply, fill #1

## 2022-06-01 MED ORDER — RESTASIS 0.05 % OP EMUL
1.0000 [drp] | Freq: Two times a day (BID) | OPHTHALMIC | 1 refills | Status: AC
  Filled 2022-06-01: qty 60, 30d supply, fill #0
  Filled 2023-02-13: qty 60, 30d supply, fill #1

## 2022-06-02 ENCOUNTER — Other Ambulatory Visit (HOSPITAL_COMMUNITY): Payer: Self-pay

## 2022-06-04 ENCOUNTER — Ambulatory Visit (INDEPENDENT_AMBULATORY_CARE_PROVIDER_SITE_OTHER): Payer: 59

## 2022-06-04 ENCOUNTER — Ambulatory Visit
Admission: EM | Admit: 2022-06-04 | Discharge: 2022-06-04 | Disposition: A | Discharge status: Home / Self Care | Payer: 59 | Attending: Nurse Practitioner | Admitting: Nurse Practitioner

## 2022-06-04 DIAGNOSIS — R399 Unspecified symptoms and signs involving the genitourinary system: Secondary | ICD-10-CM | POA: Diagnosis not present

## 2022-06-04 DIAGNOSIS — R1032 Left lower quadrant pain: Secondary | ICD-10-CM

## 2022-06-04 DIAGNOSIS — R109 Unspecified abdominal pain: Secondary | ICD-10-CM

## 2022-06-04 LAB — POCT URINALYSIS DIP (MANUAL ENTRY)
Bilirubin, UA: NEGATIVE
Glucose, UA: NEGATIVE mg/dL
Ketones, POC UA: NEGATIVE mg/dL
Leukocytes, UA: NEGATIVE
Nitrite, UA: NEGATIVE
Protein Ur, POC: NEGATIVE mg/dL
Spec Grav, UA: <=1.005 — AB (ref 1.010–1.025)
Urobilinogen, UA: 0.2 E.U./dL
pH, UA: 5.5 (ref 5.0–8.0)

## 2022-06-04 MED ORDER — TAMSULOSIN HCL 0.4 MG PO CAPS: 0.4000 mg | ORAL_CAPSULE | Freq: Every day | ORAL | 0 refills | Status: DC

## 2022-06-04 MED ORDER — IBUPROFEN 800 MG PO TABS: 800.0000 mg | ORAL_TABLET | Freq: Three times a day (TID) | ORAL | 0 refills | Status: DC | PRN

## 2022-06-04 NOTE — Discharge Instructions (Addendum)
The x-ray shows possible kidney stones in the left kidney and left ureters.  X-ray suggest that this could be further evaluated with a CT scan if necessary. Take medication as prescribed. Strain your urine each time to see if you have passed the stone. Continue to drink plenty of fluids.  Try to drink at least 8-10 8 ounce glasses of water while symptoms persist. Recommend the use of over-the-counter Tylenol arthritis strength 650 mg tablets as needed for pain given your history of ulcers.  For severe pain, may take ibuprofen only as needed.  Use medication sparingly and take with food and water. As discussed, if pain worsens or is not controlled, you will need to follow-up in the emergency department for further evaluation. Recommend following up with Dr. Alyson Ingles within the next week for reevaluation. Follow-up as needed.

## 2022-06-04 NOTE — ED Provider Notes (Signed)
[facility]    CSN: [id number] Arrival date & time: 06/04/22  0803      History   Chief Complaint Chief Complaint  Patient presents with   Flank Pain    HPI Kristine Lawson is a 51 y.o. female.   The history is provided by the patient.   Patient presents for complaints of left flank and left lower quadrant abdominal pain.  Patient states symptoms started 1 day ago.  Patient states that the pain, "moves and intervals".  She states that during the night last evening, the pain became more intense and she had to get up and take naproxen.  She states pain is controlled at this time.  She states that she is also experience urinary frequency, urgency, and nausea.  Patient reports a history of renal calculi, states she has seen Dr. Alyson Ingles in the past and he informed her that she did have a stone in the left kidney.  She states that she has also been increasing her water intake since her symptoms started.  Patient reports that she had taken tamsulosin in the past, which seem to help her pass her stone without difficulty.  Past Medical History:  Diagnosis Date   Allergy    Anemia    IDA   Arthritis    Arthritis    Family history of breast cancer    Family history of cancer of gallbladder    Family history of cervical cancer    Family history of lung cancer    Family history of prostate cancer    Family history of throat cancer    GERD (gastroesophageal reflux disease)    History of stomach ulcers    Hypertension    Insomnia    meds needed daily to achieve 6+ hours sleep.   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   Nephrolithiasis 06/01/2020   Ureteral calculus 05/07/2020   Allergic rhinitis 01/15/2020   Genetic testing 12/16/2019   Family history of breast cancer    Family history  of prostate cancer    Family history of cancer of gallbladder    Family history of cervical cancer    Family history of lung cancer    Family history of throat cancer    Tendinitis 11/13/2019   Trapezius strain 11/13/2019   Family history of cancer 10/28/2019   Arthritis 06/05/2019   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 10/01/2018   Muscle spasm 02/04/2018   S/P gastric bypass 02/10/2015   Insomnia 04/05/2014   Dyslipidemia 57/84/6962   Metabolic syndrome X 95/28/4132   Vitamin D deficiency 04/05/2014   Psychosocial stressors 12/21/2012   Obesity (BMI 30.0-34.9) 03/05/2011   PES PLANUS 05/01/2008   Acne vulgaris 09/28/2007   HYPERTENSION, BENIGN ESSENTIAL 10/24/2006    Past Surgical History:  Procedure Laterality Date   ABLATION     uterine ablation   APPENDECTOMY     BIOPSY  12/18/2019   Procedure: BIOPSY;  Surgeon: Rogene Houston, MD;  [facility]: [facility];  Service: Endoscopy;;  gastric   BREATH TEK H PYLORI N/A 11/18/2014   Procedure: BREATH TEK Kandis Ban;  Surgeon: Greer Pickerel, MD;  Location: [facility];  Service: General;  Laterality: N/A;   CHOLECYSTECTOMY     COLONOSCOPY N/A 12/18/2019   Procedure: COLONOSCOPY;  Surgeon: Laural Golden,  Mechele Dawley, MD;  Location: [facility];  Service: Endoscopy;  Laterality: N/A;  [place] Right 05/07/2020   Procedure: CYSTOSCOPY WITH RIGHT RETROGRADE PYELOGRAM;  Surgeon: Cleon Gustin, MD;  Location: [facility];  Service: Urology;  Laterality: Right;   CYSTOSCOPY W/ URETERAL STENT PLACEMENT Right 05/07/2020   Procedure: CYSTOSCOPY WITH RIGHT URETERAL STENT REPLACEMENT;  Surgeon: Cleon Gustin, MD;  Location: [facility];  Service: Urology;  Laterality: Right;   ESOPHAGOGASTRODUODENOSCOPY N/A 10/01/2018   Procedure: ESOPHAGOGASTRODUODENOSCOPY (EGD);  Surgeon: Rogene Houston, MD;  [facility]: [facility];  Service: Endoscopy;  Laterality: N/A;   ESOPHAGOGASTRODUODENOSCOPY N/A 12/18/2019   Procedure:  ESOPHAGOGASTRODUODENOSCOPY (EGD);  Surgeon: Rogene Houston, MD;  [facility];  Service: Endoscopy;  Laterality: N/A;   EXTRACORPOREAL SHOCK WAVE LITHOTRIPSY Right 05/12/2020   Procedure: EXTRACORPOREAL SHOCK WAVE LITHOTRIPSY (ESWL);  Surgeon: Cleon Gustin, MD;  Location: [facility];  Service: Urology;  Laterality: Right;   HERNIA REPAIR N/A    [employer]reesia 11/10/2020   LAPAROSCOPIC ROUX-EN-Y GASTRIC BYPASS WITH HIATAL HERNIA REPAIR N/A 02/10/2015   Procedure: LAPAROSCOPIC ROUX-EN-Y GASTRIC BYPASS ;  Surgeon: Greer Pickerel, MD;  Location: [facility];  Service: General;  Laterality: N/A;   TUBAL LIGATION     UPPER GI ENDOSCOPY N/A 02/10/2015   Procedure: UPPER GI ENDOSCOPY;  Surgeon: Greer Pickerel, MD;  Location: [facility];  Service: General;  Laterality: N/A;    OB History   No obstetric history on file.      Home Medications    Prior to Admission medications   Medication Sig Start Date End Date Taking? Authorizing Provider  ibuprofen (ADVIL) 800 MG tablet Take 1 tablet (800 mg total) by mouth every 8 (eight) hours as needed. 06/04/22  Yes Christiann Hagerty-Warren, Alda Lea, NP  tamsulosin (FLOMAX) 0.4 MG CAPS capsule Take 1 capsule (0.4 mg total) by mouth daily after supper. 06/04/22  Yes Manreet Kiernan-Warren, Alda Lea, NP  acyclovir (ZOVIRAX) 400 MG tablet Take one tablet by mouth once daily 04/05/22   Fayrene Helper, MD  Calcium Citrate (CITRACAL PO) Take 500 mg by mouth daily.     [provider]  cyclobenzaprine (FLEXERIL) 10 MG tablet Take 1 tablet (10 mg total) by mouth daily as needed. 04/05/22   Fayrene Helper, MD  diclofenac Sodium (VOLTAREN) 1 % GEL Apply 2 grams to the affected areas 3 times daily 06/02/21     ergocalciferol (VITAMIN D2) 1.25 MG (50000 UT) capsule Take 1 capsule (50,000 Units total) by mouth once a week. 01/21/22   Fayrene Helper, MD  Fluticasone Propionate Truett Perna) 93 MCG/ACT EXHU Place 1 spray into the nose in the morning and at bedtime. 08/06/21    Fayrene Helper, MD  gabapentin (NEURONTIN) 100 MG capsule Take 1-2 capsules by mouth at bedtime as needed for pain 05/02/22   Fayrene Helper, MD  hydrOXYzine (VISTARIL) 50 MG capsule Take one capsule by mouth at bedtime for sleep 04/05/22   Fayrene Helper, MD  levocetirizine (XYZAL) 5 MG tablet Take 1 tablet by mouth every evening. 06/01/22   Fayrene Helper, MD  Multiple Vitamin (MULTIVITAMIN) tablet Take 1 tablet by mouth daily.     [provider]  olmesartan-hydrochlorothiazide (BENICAR HCT) 20-12.5 MG tablet Take 1 tablet by mouth daily. 01/21/22 01/21/23  Fayrene Helper, MD  pantoprazole (PROTONIX) 40 MG tablet Take 1 tablet by mouth daily before breakfast. 12/17/21   Fayrene Helper, MD  polycarbophil (  FIBERCON) 625 MG tablet Take 1,250 mg by mouth daily.    [provider]  RESTASIS 0.05 % ophthalmic emulsion Place 1 drop into both eyes 2 times daily. 06/01/22   Fayrene Helper, MD  Semaglutide-Weight Management 2.4 MG/0.75ML SOAJ Inject 2.4 mg into the skin once a week. 11/09/21   Fayrene Helper, MD  Semaglutide-Weight Management 2.4 MG/0.75ML SOAJ Inject 2.4 mg into the skin once a week. 12/07/21   Fayrene Helper, MD  Semaglutide-Weight Management 2.4 MG/0.75ML SOAJ Inject 2.4 mg into the skin once a week. 04/11/22   Fayrene Helper, MD  spironolactone (ALDACTONE) 25 MG tablet Take 1/2 tablet by mouth once daily for facial acne 03/29/21   Fayrene Helper, MD  traZODone (DESYREL) 50 MG tablet TAKE 1 TABLET BY MOUTH AT BEDTIME 12/31/21 12/31/22  Fayrene Helper, MD  vitamin B-12 (CYANOCOBALAMIN) 1000 MCG tablet Takes 5000 mcg  once every 3 months, on 6 mcg daily with flinstone 08/06/21   Fayrene Helper, MD    Family History Family History  Problem Relation Age of Onset   Hypertension Mother    Diabetes Mother    Kidney disease Mother    Hyperlipidemia Mother    Colon cancer Mother 44       highly suspious of colon mass    Breast cancer Mother 54   Kidney disease Father    Stroke Father    Heart disease Father    Hyperlipidemia Father    Heart attack Father    Diverticulitis Father    Hypertension Brother    Diabetes Brother    Healthy Sister    Other Daughter        hypertension   Kidney disease Daughter    Blindness Daughter    Healthy Daughter    Healthy Son    Cancer Maternal Aunt 66       gallbladder cancer   Breast cancer Paternal Aunt        dx. in her 80s   Cervical cancer Maternal Grandmother        dx. in her 89s   Prostate cancer Maternal Grandfather        metastatic   Heart attack Paternal Grandmother    Hypertension Paternal Grandmother    Stroke Paternal Grandmother    Lung cancer Paternal Grandfather    Throat cancer Paternal Grandfather    Breast cancer Paternal Aunt        dx. in her 10s   Breast cancer Paternal Aunt        dx. in her 68s   Throat cancer Paternal Uncle    Lung cancer Paternal Uncle    Breast cancer Other        dx. 61s or 2s (mother's first cousins x3)   SIDS Maternal Aunt    Prostate cancer Maternal Uncle        dx. in his 56s, not metastatic   Prostate cancer Maternal Uncle        dx. in his 19s, not metastatic   Breast cancer Cousin        dx. late 91s (paternal cousin)   Breast cancer Other        dx. 40s/early 26s (maternal second cousins x2)    Social History Social History   Tobacco Use   Smoking status: Former    Packs/day: 0.25    Years: 3.00    Total pack years: 0.75    Types: Cigarettes    Quit date:  02/05/2007    Years since quitting: 15.3   Smokeless tobacco: Never  Vaping Use   Vaping Use: Never used  Substance Use Topics   Alcohol use: Yes    Comment: occassional wine 2-3 times a year   Drug use: No     Allergies   Lisinopril and Triamterene   Review of Systems Review of Systems Per HPI  Physical Exam Triage Vital Signs ED Triage Vitals [06/04/22 0814]  Enc Vitals Group     BP (!) 154/96     Pulse Rate 81      Resp 16     Temp 98.1 F (36.7 C)     Temp Source Oral     SpO2 98 %     Weight      Height      Head Circumference      Peak Flow      Pain Score 4     Pain Loc      Pain Edu?      Excl. in [place]?    No data found.  Updated Vital Signs BP (!) 154/96 (BP Location: Right Arm)   Pulse 81   Temp 98.1 F (36.7 C) (Oral)   Resp 16   SpO2 98%   Visual Acuity Right Eye Distance:   Left Eye Distance:   Bilateral Distance:    Right Eye Near:   Left Eye Near:    Bilateral Near:     Physical Exam Vitals and nursing note reviewed.  Constitutional:      General: She is not in acute distress.    Appearance: Normal appearance.  HENT:     Head: Normocephalic.  Eyes:     Extraocular Movements: Extraocular movements intact.     Pupils: Pupils are equal, round, and reactive to light.  Cardiovascular:     Rate and Rhythm: Regular rhythm.     Pulses: Normal pulses.     Heart sounds: Normal heart sounds.  Pulmonary:     Effort: Pulmonary effort is normal. No respiratory distress.     Breath sounds: Normal breath sounds. No stridor. No wheezing, rhonchi or rales.  Abdominal:     General: Bowel sounds are normal.     Palpations: Abdomen is soft.     Tenderness: There is no right CVA tenderness or left CVA tenderness.  Musculoskeletal:     Cervical back: Normal range of motion.  Skin:    General: Skin is warm and dry.  Neurological:     General: No focal deficit present.     Mental Status: She is alert and oriented to person, place, and time.  Psychiatric:        Mood and Affect: Mood normal.        Behavior: Behavior normal.      UC Treatments / Results  Labs (all labs ordered are listed, but only abnormal results are displayed) Labs Reviewed  POCT URINALYSIS DIP (MANUAL ENTRY) - Abnormal; Notable for the following components:      Result Value   Spec Grav, UA <=1.005 (*)    Blood, UA trace-intact (*)    All other components within normal limits     EKG   Radiology DG Abd 1 View  Result Date: 06/04/2022 CLINICAL DATA:  Left lower quadrant pain EXAM: ABDOMEN - 1 VIEW COMPARISON:  05/29/2020 FINDINGS: Punctate calcification projects in the left abdomen, likely small left renal stone. Calcifications project over the L3 left spinous process could reflect renal pelvic or  proximal ureteral stones. Nonobstructive bowel gas pattern. Prior cholecystectomy. No organomegaly or free air. IMPRESSION: Suspect left nephrolithiasis and possible left ureteral stones. This could be further evaluated with CT if felt clinically indicated. Electronically Signed   By: Rolm Baptise M.D.   On: 06/04/2022 08:41    Procedures Procedures (including critical care time)  Medications Ordered in UC Medications - No data to display  Initial Impression / Assessment and Plan / UC Course  I have reviewed the triage vital signs and the nursing notes.  Pertinent labs & imaging results that were available during my care of the patient were reviewed by me and considered in my medical decision making (see chart for details).  Patient presents for complaints of left flank and left groin pain that started over the past 2 to 3 days.  On exam, she is mildly hypertensive, but otherwise well-appearing and is in no acute distress.  Urinalysis positive for trace blood.  Do not suspect a urinary tract infection or pyelonephritis.  X-ray suggest a left kidney stone.  This is consistent with the diagnosis of left flank pain and urinary symptoms.  We will treat patient with tamsulosin 0.4 mg to help pass the stone.  For her severe pain, ibuprofen 800 mg was provided.  Patient was encouraged to continue to increase her fluid intake.  Supportive care recommendations were also provided to the patient along with strict ER precautions.  Also recommend that she follow-up with Dr. Alyson Ingles for further evaluation.  Patient verbalizes understanding.  All questions were answered.  Patient stable  for discharge. Final Clinical Impressions(s) / UC Diagnoses   Final diagnoses:  Left flank pain  Urinary symptom or sign     Discharge Instructions      The x-ray shows possible kidney stones, which can be verified by a CT scan     ED Prescriptions     Medication Sig Dispense Auth. Provider   tamsulosin (FLOMAX) 0.4 MG CAPS capsule Take 1 capsule (0.4 mg total) by mouth daily after supper. 30 capsule Ketzia Guzek-Warren, Alda Lea, NP   ibuprofen (ADVIL) 800 MG tablet Take 1 tablet (800 mg total) by mouth every 8 (eight) hours as needed. 30 tablet Rosealee Recinos-Warren, Alda Lea, NP      PDMP not reviewed this encounter.   Tish Men, NP 06/04/22 [phone]

## 2022-06-04 NOTE — ED Triage Notes (Signed)
Pt states left flank and groin pain with nausea and urinary frequency since yesterday. Has been taking Naproxen with relief. History of kidney stones.

## 2022-06-09 ENCOUNTER — Other Ambulatory Visit (HOSPITAL_COMMUNITY): Payer: Self-pay

## 2022-07-07 ENCOUNTER — Other Ambulatory Visit: Payer: Self-pay

## 2022-07-07 ENCOUNTER — Other Ambulatory Visit (HOSPITAL_COMMUNITY): Payer: Self-pay

## 2022-07-11 ENCOUNTER — Other Ambulatory Visit: Payer: Self-pay | Admitting: Family Medicine

## 2022-07-12 ENCOUNTER — Encounter: Payer: Self-pay | Admitting: Family Medicine

## 2022-07-12 ENCOUNTER — Other Ambulatory Visit (HOSPITAL_COMMUNITY): Payer: Self-pay

## 2022-07-12 ENCOUNTER — Other Ambulatory Visit: Payer: Self-pay

## 2022-07-12 MED ORDER — WEGOVY 2.4 MG/0.75ML ~~LOC~~ SOAJ
2.4000 mg | SUBCUTANEOUS | 3 refills | Status: DC
Start: 2022-07-12 — End: 2023-03-30
  Filled 2022-07-12: qty 3, 28d supply, fill #0
  Filled 2022-08-31 – 2022-10-04 (×3): qty 3, 28d supply, fill #1
  Filled 2022-10-31: qty 3, 28d supply, fill #2
  Filled 2022-11-27 – 2022-11-28 (×2): qty 3, 28d supply, fill #3

## 2022-07-14 ENCOUNTER — Other Ambulatory Visit: Payer: Self-pay

## 2022-07-14 ENCOUNTER — Other Ambulatory Visit: Payer: Self-pay | Admitting: Family Medicine

## 2022-07-14 ENCOUNTER — Other Ambulatory Visit (HOSPITAL_COMMUNITY): Payer: Self-pay

## 2022-07-14 MED ORDER — SPIRONOLACTONE 25 MG PO TABS
12.5000 mg | ORAL_TABLET | Freq: Every day | ORAL | 3 refills | Status: DC
  Filled 2022-07-14: qty 45, 90d supply, fill #0
  Filled 2023-02-13: qty 45, 90d supply, fill #1

## 2022-07-14 MED ORDER — CYCLOBENZAPRINE HCL 10 MG PO TABS
10.0000 mg | ORAL_TABLET | Freq: Every day | ORAL | 0 refills | Status: DC | PRN
  Filled 2022-07-14: qty 30, 30d supply, fill #0

## 2022-07-29 ENCOUNTER — Other Ambulatory Visit (HOSPITAL_COMMUNITY): Payer: Self-pay

## 2022-07-29 ENCOUNTER — Other Ambulatory Visit: Payer: Self-pay | Admitting: Family Medicine

## 2022-07-29 ENCOUNTER — Other Ambulatory Visit: Payer: Self-pay

## 2022-07-29 MED ORDER — TRAZODONE HCL 50 MG PO TABS
50.0000 mg | ORAL_TABLET | Freq: Every day | ORAL | 1 refills | Status: DC
  Filled 2022-07-29: qty 90, 90d supply, fill #0

## 2022-08-11 ENCOUNTER — Other Ambulatory Visit (HOSPITAL_COMMUNITY): Payer: Self-pay

## 2022-08-22 ENCOUNTER — Encounter: Payer: Self-pay | Admitting: Family Medicine

## 2022-08-26 ENCOUNTER — Encounter: Payer: 59 | Admitting: Family Medicine

## 2022-08-31 ENCOUNTER — Other Ambulatory Visit (HOSPITAL_COMMUNITY): Payer: Self-pay

## 2022-08-31 ENCOUNTER — Other Ambulatory Visit: Payer: Self-pay

## 2022-09-07 ENCOUNTER — Other Ambulatory Visit: Payer: Self-pay

## 2022-09-07 ENCOUNTER — Other Ambulatory Visit: Payer: Self-pay | Admitting: Family Medicine

## 2022-09-08 ENCOUNTER — Other Ambulatory Visit (HOSPITAL_COMMUNITY): Payer: Self-pay

## 2022-09-08 ENCOUNTER — Other Ambulatory Visit: Payer: Self-pay

## 2022-09-08 MED ORDER — CYCLOBENZAPRINE HCL 10 MG PO TABS
10.0000 mg | ORAL_TABLET | Freq: Every day | ORAL | 5 refills | Status: DC | PRN
  Filled 2022-09-08: qty 30, 30d supply, fill #0
  Filled 2023-06-18: qty 30, 30d supply, fill #1

## 2022-09-09 ENCOUNTER — Other Ambulatory Visit (HOSPITAL_COMMUNITY): Payer: Self-pay

## 2022-09-09 ENCOUNTER — Encounter: Payer: Self-pay | Admitting: Family Medicine

## 2022-09-09 ENCOUNTER — Ambulatory Visit (INDEPENDENT_AMBULATORY_CARE_PROVIDER_SITE_OTHER): Payer: Commercial Managed Care - PPO | Admitting: Family Medicine

## 2022-09-09 ENCOUNTER — Other Ambulatory Visit: Payer: Self-pay

## 2022-09-09 VITALS — BP 123/75 | HR 87 | Ht 68.0 in | Wt 209.1 lb

## 2022-09-09 DIAGNOSIS — L299 Pruritus, unspecified: Secondary | ICD-10-CM

## 2022-09-09 DIAGNOSIS — E042 Nontoxic multinodular goiter: Secondary | ICD-10-CM | POA: Diagnosis not present

## 2022-09-09 DIAGNOSIS — F5104 Psychophysiologic insomnia: Secondary | ICD-10-CM | POA: Diagnosis not present

## 2022-09-09 DIAGNOSIS — Z0001 Encounter for general adult medical examination with abnormal findings: Secondary | ICD-10-CM | POA: Diagnosis not present

## 2022-09-09 DIAGNOSIS — E66811 Obesity, class 1: Secondary | ICD-10-CM

## 2022-09-09 DIAGNOSIS — E669 Obesity, unspecified: Secondary | ICD-10-CM | POA: Diagnosis not present

## 2022-09-09 MED ORDER — ESZOPICLONE 2 MG PO TABS
2.0000 mg | ORAL_TABLET | Freq: Every evening | ORAL | 1 refills | Status: DC | PRN
  Filled 2022-09-09 – 2022-09-12 (×3): qty 30, 30d supply, fill #0

## 2022-09-09 MED ORDER — CLOBETASOL PROPIONATE 0.05 % EX SOLN
1.0000 | Freq: Two times a day (BID) | CUTANEOUS | 1 refills | Status: DC
  Filled 2022-09-09 – 2022-09-12 (×3): qty 50, 25d supply, fill #0
  Filled 2022-10-04: qty 50, 25d supply, fill #1

## 2022-09-09 NOTE — Assessment & Plan Note (Signed)
Rept [place]

## 2022-09-09 NOTE — Patient Instructions (Addendum)
Follow-up in 4 months, call if you need me sooner.  New medication started for sleep Lunesta 2 mg at bedtime.  Please send a message in the next few weeks to let me  know if this dose is working for you or if  you need to have a 3 mg dose o  If that is not successful I recommend that you see a sleep specialist, and I also recommend that you discuss sleep problems with your therapist.  Please follow up on ENT re evaluation you  Weight loss goal of 9 pounds in the next 4 months, you can do this as your medication medication to help with weight loss.  Please make a commitment to start an exercise program  30 minutes 5 days/week minimum  You have Always pushed yourself and you are very able to do so I know this will be challenging but I really encourage you to make an exercise commitment and stick with it.  This will improve every aspect of your health including your sleep.  Please get labs already ordered today I will send a message with the results.  Thyroid US I sordered , you need to get this and then have ENT re evaluate you Please schedule your mammogram which is due in June.  You can go ahead and self refer for evaluation of your reflux and also discuss the frequency of colonoscopies where you are concerned as your mother passed of colon cancer in her early 83s.  Topical medication clobetasol is prescribed to use for itching of the scalp, if this helps significantly then you will need to use less hydroxyzine which is the goal  Thanks for choosing [facility], we consider it a privelige to serve you.

## 2022-09-11 ENCOUNTER — Encounter: Payer: Self-pay | Admitting: Family Medicine

## 2022-09-11 DIAGNOSIS — Z0001 Encounter for general adult medical examination with abnormal findings: Secondary | ICD-10-CM | POA: Insufficient documentation

## 2022-09-11 NOTE — Assessment & Plan Note (Signed)
Annual exam as documented. Counseling done  re healthy lifestyle involving commitment to 150 minutes exercise per week, heart healthy diet, and attaining healthy weight.The importance of adequate sleep also discussed. Changes in health habits are decided on by the patient with goals and time frames  set for achieving them. Immunization and cancer screening needs are specifically addressed at this visit. 

## 2022-09-11 NOTE — Assessment & Plan Note (Signed)
Uncontrolled Start lunesta 2 mg may increase to 3 mg, if still not successful I recommend Sleep specialist Sleep hygiene reviewed and written information offered also. Prescription sent for  medication needed.

## 2022-09-11 NOTE — Assessment & Plan Note (Signed)
Trial of topical clobetasol and d/chydroxyzine

## 2022-09-11 NOTE — Assessment & Plan Note (Signed)
  Patient re-educated about  the importance of commitment to a  minimum of 150 minutes of exercise per week as able.  The importance of healthy food choices with portion control discussed, as well as eating regularly and within a 12 hour window most days. The need to choose "clean , green" food 50 to 75% of the time is discussed, as well as to make water the primary drink and set a goal of 64 ounces water daily.       09/09/2022    2:25 PM 04/08/2022    1:28 PM 11/09/2021    4:32 PM  Weight /BMI  Weight 209 lb 1.9 oz 209 lb 1.9 oz 215 lb  Height 5' 8"$  (1.727 m) 5' 8"$  (1.727 m) 5' 8"$  (1.727 m)  BMI 31.8 kg/m2 31.8 kg/m2 32.69 kg/m2    Weight loss goal of 10 pounds in next 3 months, needs to exercise

## 2022-09-11 NOTE — Progress Notes (Signed)
    Kristine Lawson     MRN: [id number]      DOB: [birth year]  HPI: Patient is in for annual physical exam. C/o very poor sleep and wants to try new medication for sleep, states she has multiple awakenings, and has discontinued many of the meds she has been taking in the past States a lot of her  sleep disturbance  was triggered by problems in her home and she has forgiven herself for any part she inadvertently played to create the problems, also has a Hydrographic surveyor is reviewed , and  is up to date   PE: BP 123/75 (BP Location: Right Arm, Patient Position: Sitting, Cuff Size: Large)   Pulse 87   Ht 5' 8"$  (1.727 m)   Wt 209 lb 1.9 oz (94.9 kg)   SpO2 97%   BMI 31.80 kg/m   Pleasant  female, alert and oriented x 3, in no cardio-pulmonary distress. Afebrile. HEENT No facial trauma or asymetry. Sinuses non tender.  Extra occullar muscles intact.. External ears normal, . Neck: supple,  Chest: Clear to ascultation bilaterally.No crackles or wheezes.   Cardiovascular system; Heart sounds normal,  S1 and  S2 ,no S3.  No murmur, or thrill. .  Abdomen: Soft, non tender   Musculoskeletal exam: Full ROM of spine, hips , shoulders and knees.    Neurologic: Cranial nerves 2 to 12 intact.  No disturbance in gait. No tremor.  Skin: Intact, no ulceration, erythema , scaling or rash noted. Pigmentation normal throughout  Psych; Normal mood and affect. Judgement and concentration normal   Assessment & Plan:  Multinodular goiter Rept [place] to re evaluate nodules  Annual visit for general adult medical examination with abnormal findings Annual exam as documented. Counseling done  re healthy lifestyle involving commitment to 150 minutes exercise per week, heart healthy diet, and attaining healthy weight.The importance of adequate sleep also discussed. . Changes in health habits are decided on by the patient with goals and time frames  set for achieving  them. Immunization and cancer screening needs are specifically addressed at this visit.   Insomnia Uncontrolled Start lunesta 2 mg may increase to 3 mg, if still not successful I recommend Sleep specialist Sleep hygiene reviewed and written information offered also. Prescription sent for  medication needed.   Obesity (BMI 30.0-34.9)  Patient re-educated about  the importance of commitment to a  minimum of 150 minutes of exercise per week as able.  The importance of healthy food choices with portion control discussed, as well as eating regularly and within a 12 hour window most days. The need to choose "clean , green" food 50 to 75% of the time is discussed, as well as to make water the primary drink and set a goal of 64 ounces water daily.       09/09/2022    2:25 PM 04/08/2022    1:28 PM 11/09/2021    4:32 PM  Weight /BMI  Weight 209 lb 1.9 oz 209 lb 1.9 oz 215 lb  Height 5' 8"$  (1.727 m) 5' 8"$  (1.727 m) 5' 8"$  (1.727 m)  BMI 31.8 kg/m2 31.8 kg/m2 32.69 kg/m2    Weight loss goal of 10 pounds in next 3 months, needs to exercise  Itchy scalp Trial of topical clobetasol and d/chydroxyzine

## 2022-09-12 ENCOUNTER — Other Ambulatory Visit: Payer: Self-pay

## 2022-09-12 ENCOUNTER — Other Ambulatory Visit (HOSPITAL_COMMUNITY): Payer: Self-pay

## 2022-09-12 DIAGNOSIS — Z01419 Encounter for gynecological examination (general) (routine) without abnormal findings: Secondary | ICD-10-CM | POA: Diagnosis not present

## 2022-09-12 DIAGNOSIS — Z9889 Other specified postprocedural states: Secondary | ICD-10-CM | POA: Diagnosis not present

## 2022-09-12 DIAGNOSIS — R87610 Atypical squamous cells of undetermined significance on cytologic smear of cervix (ASC-US): Secondary | ICD-10-CM | POA: Diagnosis not present

## 2022-09-12 DIAGNOSIS — I10 Essential (primary) hypertension: Secondary | ICD-10-CM | POA: Diagnosis not present

## 2022-09-12 DIAGNOSIS — E559 Vitamin D deficiency, unspecified: Secondary | ICD-10-CM | POA: Diagnosis not present

## 2022-09-14 ENCOUNTER — Encounter: Payer: Self-pay | Admitting: Family Medicine

## 2022-09-14 LAB — BMP8+EGFR
BUN/Creatinine Ratio: 10 (ref 9–23)
BUN: 8 mg/dL (ref 6–24)
CO2: 26 mmol/L (ref 20–29)
Calcium: 9.2 mg/dL (ref 8.7–10.2)
Chloride: 101 mmol/L (ref 96–106)
Creatinine, Ser: 0.83 mg/dL (ref 0.57–1.00)
Glucose: 82 mg/dL (ref 70–99)
Potassium: 5 mmol/L (ref 3.5–5.2)
Sodium: 141 mmol/L (ref 134–144)
eGFR: 85 mL/min/{1.73_m2} (ref >59)

## 2022-09-14 LAB — CBC
Hematocrit: 40.3 % (ref 34.0–46.6)
Hemoglobin: 13.2 g/dL (ref 11.1–15.9)
MCH: 29.3 pg (ref 26.6–33.0)
MCHC: 32.8 g/dL (ref 31.5–35.7)
MCV: 90 fL (ref 79–97)
Platelets: 355 10*3/uL (ref 150–450)
RBC: 4.5 x10E6/uL (ref 3.77–5.28)
RDW: 12.6 % (ref 11.7–15.4)
WBC: 4.9 10*3/uL (ref 3.4–10.8)

## 2022-09-14 LAB — VITAMIN D 25 HYDROXY (VIT D DEFICIENCY, FRACTURES): Vit D, 25-Hydroxy: 35.3 ng/mL (ref 30.0–100.0)

## 2022-09-20 ENCOUNTER — Ambulatory Visit (HOSPITAL_COMMUNITY): Payer: Commercial Managed Care - PPO

## 2022-09-21 ENCOUNTER — Encounter: Payer: Self-pay | Admitting: Family Medicine

## 2022-09-22 ENCOUNTER — Encounter: Payer: Self-pay | Admitting: Radiology

## 2022-09-26 ENCOUNTER — Ambulatory Visit (HOSPITAL_COMMUNITY): Payer: Commercial Managed Care - PPO

## 2022-10-04 ENCOUNTER — Other Ambulatory Visit (HOSPITAL_COMMUNITY): Payer: Self-pay

## 2022-10-04 ENCOUNTER — Other Ambulatory Visit: Payer: Self-pay

## 2022-10-04 ENCOUNTER — Encounter: Payer: Self-pay | Admitting: Family Medicine

## 2022-10-04 MED ORDER — ESZOPICLONE 3 MG PO TABS
3.0000 mg | ORAL_TABLET | Freq: Every day | ORAL | 2 refills | Status: DC
  Filled 2022-10-04: qty 30, 30d supply, fill #0
  Filled 2022-10-31: qty 30, 30d supply, fill #1
  Filled 2022-11-27: qty 30, 30d supply, fill #2

## 2022-10-04 MED ORDER — SEMAGLUTIDE-WEIGHT MANAGEMENT 2.4 MG/0.75ML ~~LOC~~ SOAJ
2.4000 mg | SUBCUTANEOUS | 1 refills | Status: DC
  Filled 2022-10-04: qty 9, 84d supply, fill #0

## 2022-10-05 ENCOUNTER — Other Ambulatory Visit: Payer: Self-pay

## 2022-10-05 ENCOUNTER — Other Ambulatory Visit (HOSPITAL_COMMUNITY): Payer: Self-pay

## 2022-10-31 ENCOUNTER — Other Ambulatory Visit (HOSPITAL_COMMUNITY): Payer: Self-pay

## 2022-11-01 ENCOUNTER — Other Ambulatory Visit: Payer: Self-pay

## 2022-11-09 DIAGNOSIS — L668 Other cicatricial alopecia: Secondary | ICD-10-CM | POA: Diagnosis not present

## 2022-11-21 ENCOUNTER — Encounter: Payer: Self-pay | Admitting: Family Medicine

## 2022-11-22 ENCOUNTER — Ambulatory Visit: Payer: Commercial Managed Care - PPO | Admitting: Family Medicine

## 2022-11-27 ENCOUNTER — Other Ambulatory Visit: Payer: Self-pay | Admitting: Family Medicine

## 2022-11-27 ENCOUNTER — Encounter: Payer: Self-pay | Admitting: Family Medicine

## 2022-11-27 DIAGNOSIS — J309 Allergic rhinitis, unspecified: Secondary | ICD-10-CM

## 2022-11-28 ENCOUNTER — Other Ambulatory Visit: Payer: Self-pay

## 2022-11-28 ENCOUNTER — Other Ambulatory Visit (HOSPITAL_COMMUNITY): Payer: Self-pay

## 2022-11-28 MED ORDER — LEVOCETIRIZINE DIHYDROCHLORIDE 5 MG PO TABS
5.0000 mg | ORAL_TABLET | Freq: Every evening | ORAL | 1 refills | Status: DC
  Filled 2022-11-28: qty 90, 90d supply, fill #0
  Filled 2023-03-02: qty 90, 90d supply, fill #1

## 2022-11-28 MED ORDER — TRAZODONE HCL 50 MG PO TABS
50.0000 mg | ORAL_TABLET | Freq: Every day | ORAL | 2 refills | Status: DC
  Filled 2022-11-28: qty 90, 90d supply, fill #0
  Filled 2023-03-02: qty 90, 90d supply, fill #1
  Filled 2023-05-31: qty 90, 90d supply, fill #2

## 2022-11-30 DIAGNOSIS — L668 Other cicatricial alopecia: Secondary | ICD-10-CM | POA: Diagnosis not present

## 2022-12-01 ENCOUNTER — Ambulatory Visit: Payer: Commercial Managed Care - PPO | Admitting: Family Medicine

## 2022-12-01 VITALS — BP 140/90 | HR 72 | Ht 68.0 in | Wt 208.0 lb

## 2022-12-01 DIAGNOSIS — Z658 Other specified problems related to psychosocial circumstances: Secondary | ICD-10-CM

## 2022-12-01 DIAGNOSIS — I10 Essential (primary) hypertension: Secondary | ICD-10-CM | POA: Diagnosis not present

## 2022-12-01 DIAGNOSIS — L659 Nonscarring hair loss, unspecified: Secondary | ICD-10-CM

## 2022-12-01 DIAGNOSIS — E669 Obesity, unspecified: Secondary | ICD-10-CM | POA: Diagnosis not present

## 2022-12-01 DIAGNOSIS — F5104 Psychophysiologic insomnia: Secondary | ICD-10-CM

## 2022-12-01 DIAGNOSIS — L299 Pruritus, unspecified: Secondary | ICD-10-CM

## 2022-12-01 IMAGING — US US BREAST*R* LIMITED INC AXILLA
1 series · 5 of 5 positions shown · non-contrast
Comparison: Previous exam(s).

ACR Breast Density Category a: The breast tissue is almost entirely
fatty.

CLINICAL DATA: Focal stinging sensation in upper inner right chest
just above the breast for the past 3 months. She has felt a small
nodule at that location for the past 2 months. The patient reports a
history of breast cancer in multiple family members, including her
mother.

EXAM:
DIGITAL DIAGNOSTIC BILATERAL MAMMOGRAM WITH TOMOSYNTHESIS AND CAD;
ULTRASOUND RIGHT BREAST LIMITED
TECHNIQUE: Bilateral digital diagnostic mammography and breast tomosynthesis
was performed. The images were evaluated with computer-aided
detection.; Targeted ultrasound examination of the right breast was
performed

[Series 1: us breast*right* limited inc axilla · 0.05mm/px · 5 of 5 slices shown]
[im 1/5]
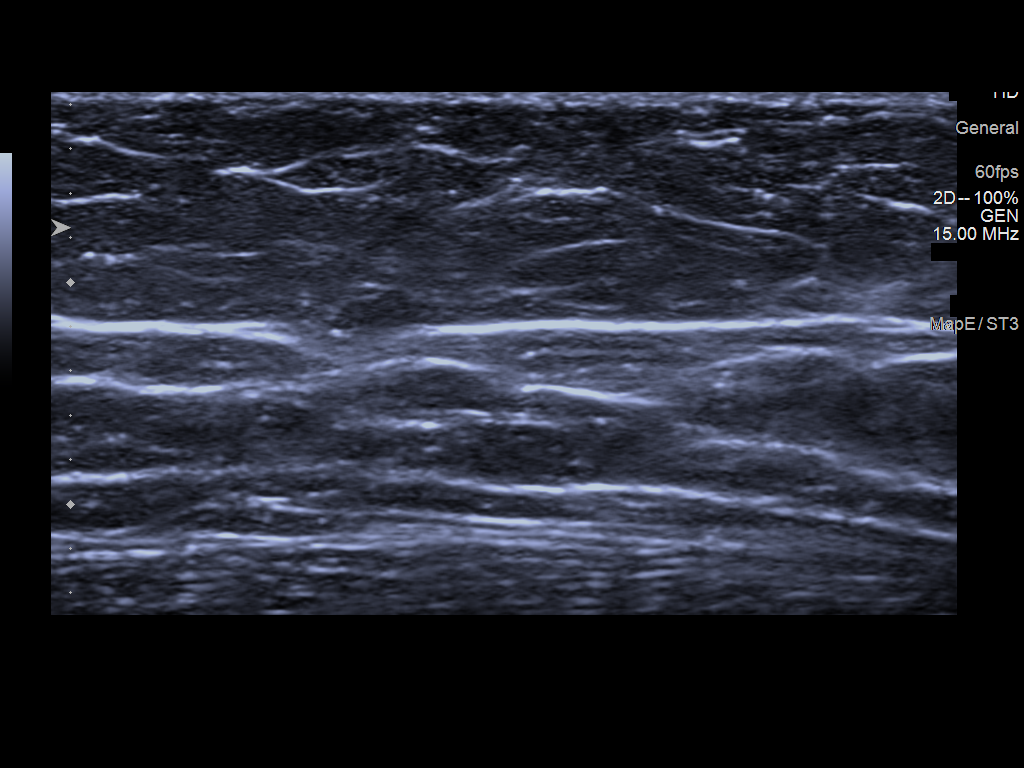
[im 2/5]
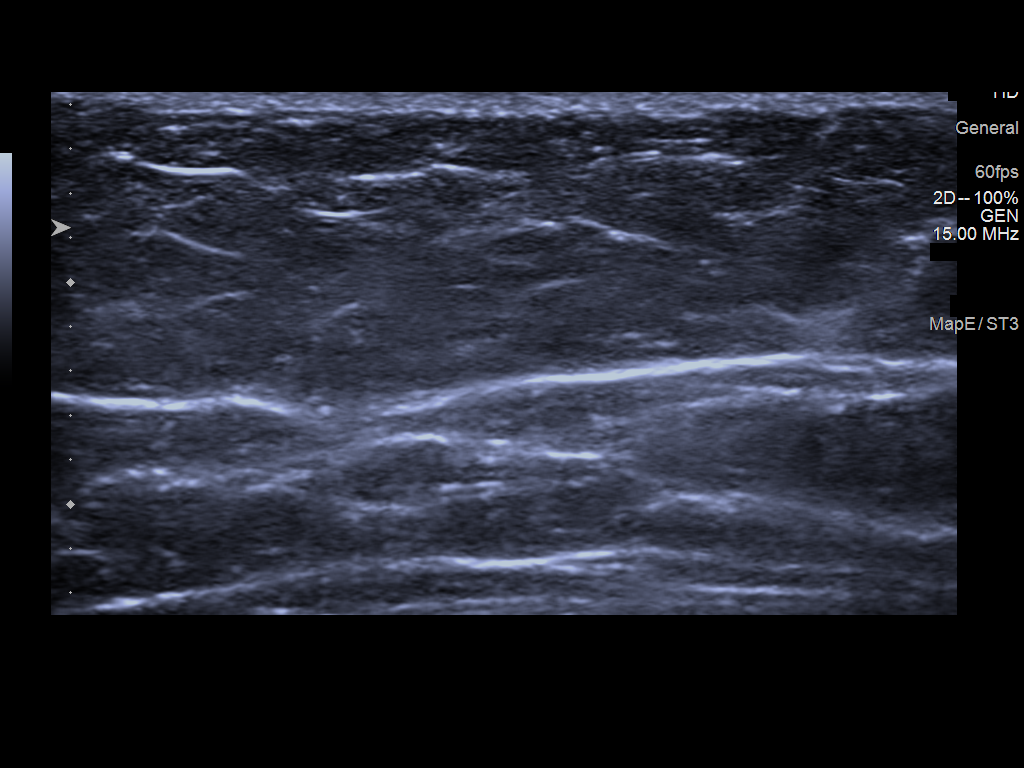
[im 3/5]
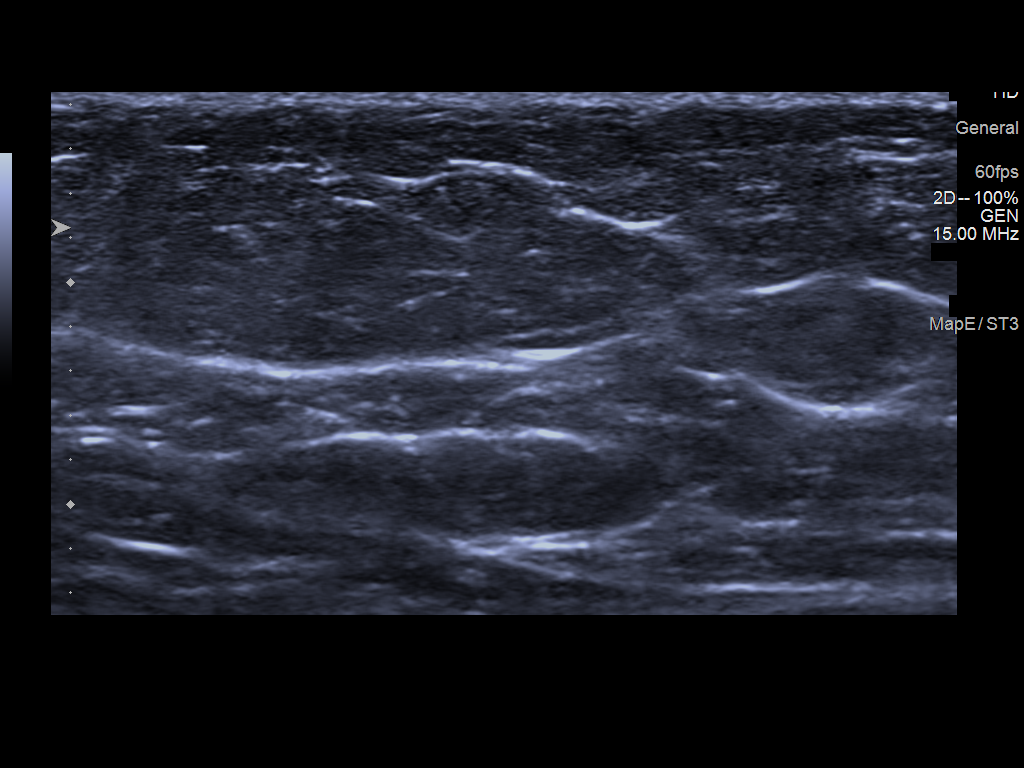
[im 4/5]
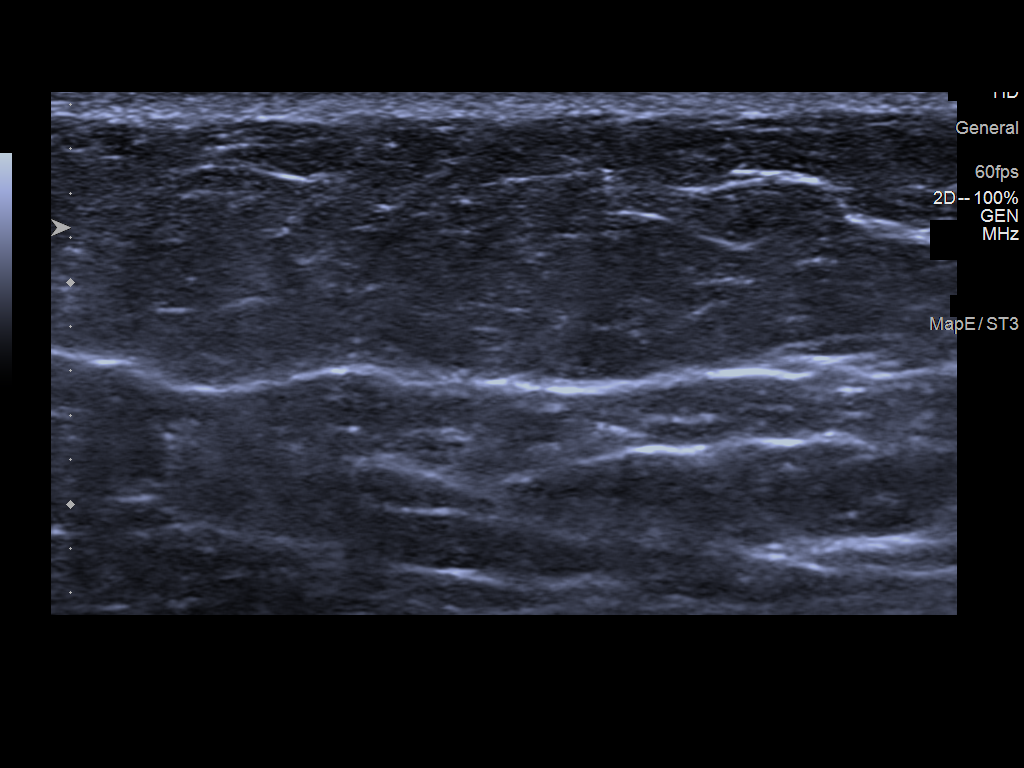
[im 5/5]
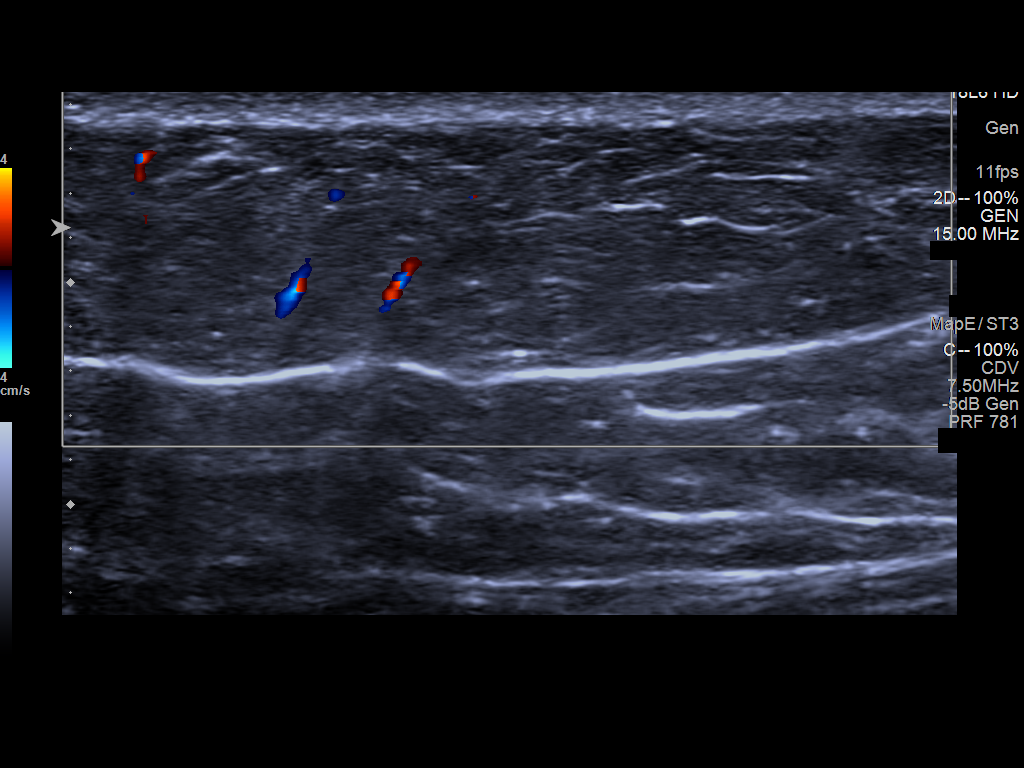

[5 of 5 positions shown; findings below may reference images not displayed]

FINDINGS: Stable mammographic appearance of the breasts with no interval
findings suspicious for malignancy in either breast.

On physical exam, there is an approximately 3 cm long vertical ridge
of mild soft tissue thickening superior to the right breast at the
location the mass and focal stinging sensation felt by the patient.

Targeted ultrasound is performed, showing normal appearing
subcutaneous fatty tissue at the location of patient concern,
including vertically oriented fat lobules. This corresponds to the
vertical ridge of palpable soft tissue thickening.
IMPRESSION: No evidence of malignancy.

RECOMMENDATION:
1. Bilateral screening mammogram in 1 year.
2. Per American Cancer Society guidelines, if the patient has a
calculated lifetime risk of developing breast cancer of greater than
20%, annual screening MRI of the breasts would also be recommended.
This could be alternated with screening mammography every 6 months.

I have discussed the findings and recommendations with the patient.
If applicable, a reminder letter will be sent to the patient
regarding the next appointment.

BI-RADS CATEGORY  1: Negative.

## 2022-12-01 IMAGING — MG DIGITAL DIAGNOSTIC BILAT W/ TOMO W/ CAD
8 series · 8 of 24 positions shown · non-contrast
Comparison: Previous exam(s).

ACR Breast Density Category a: The breast tissue is almost entirely
fatty.

CLINICAL DATA: Focal stinging sensation in upper inner right chest
just above the breast for the past 3 months. She has felt a small
nodule at that location for the past 2 months. The patient reports a
history of breast cancer in multiple family members, including her
mother.

EXAM:
DIGITAL DIAGNOSTIC BILATERAL MAMMOGRAM WITH TOMOSYNTHESIS AND CAD;
ULTRASOUND RIGHT BREAST LIMITED
TECHNIQUE: Bilateral digital diagnostic mammography and breast tomosynthesis
was performed. The images were evaluated with computer-aided
detection.; Targeted ultrasound examination of the right breast was
performed

[R CC synth-2D]
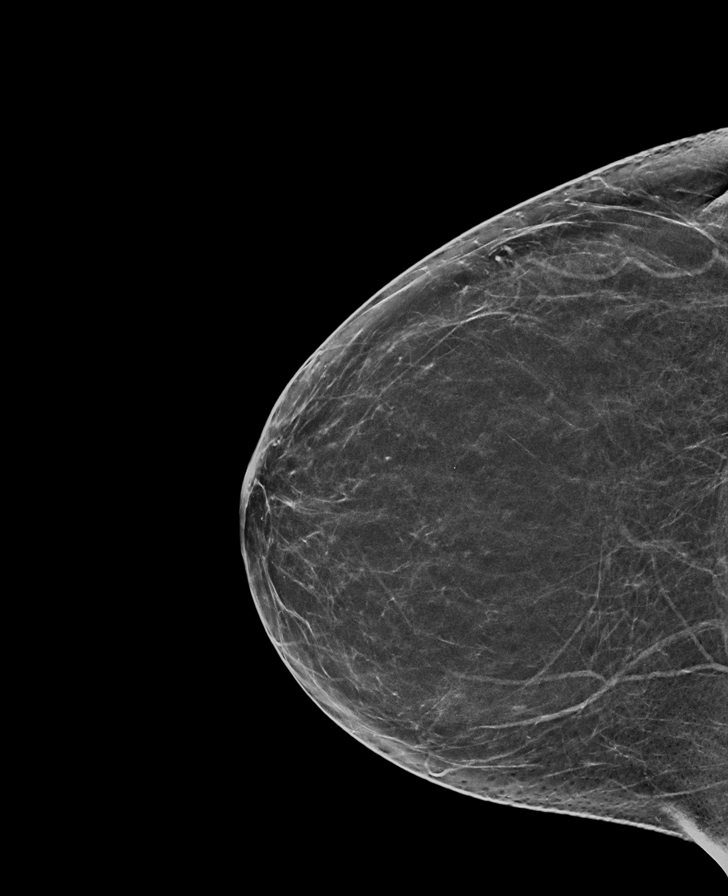

[L CC synth-2D]
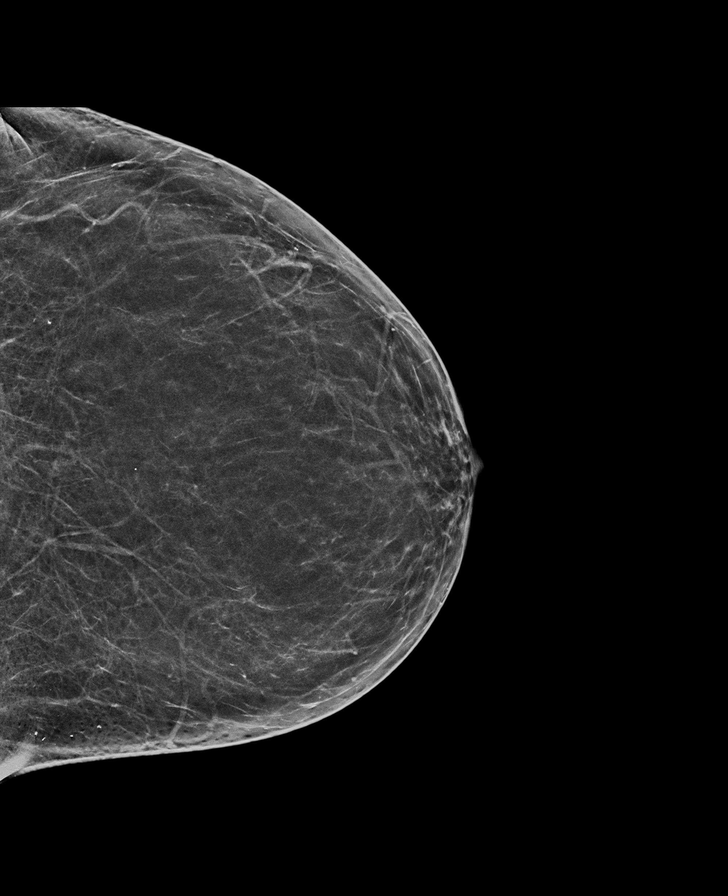

[L MLO synth-2D]
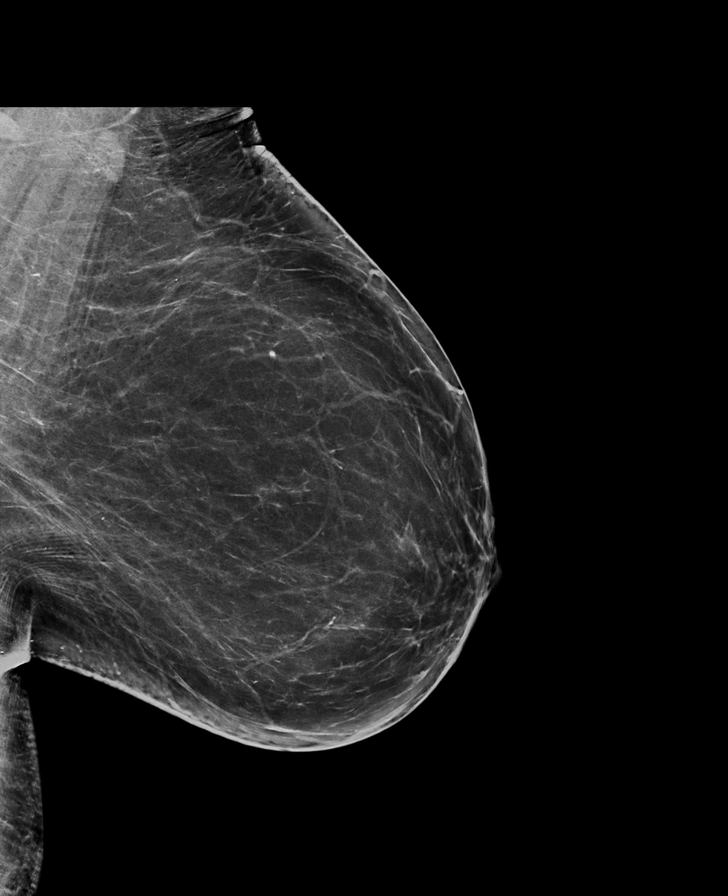

[R MLO synth-2D]
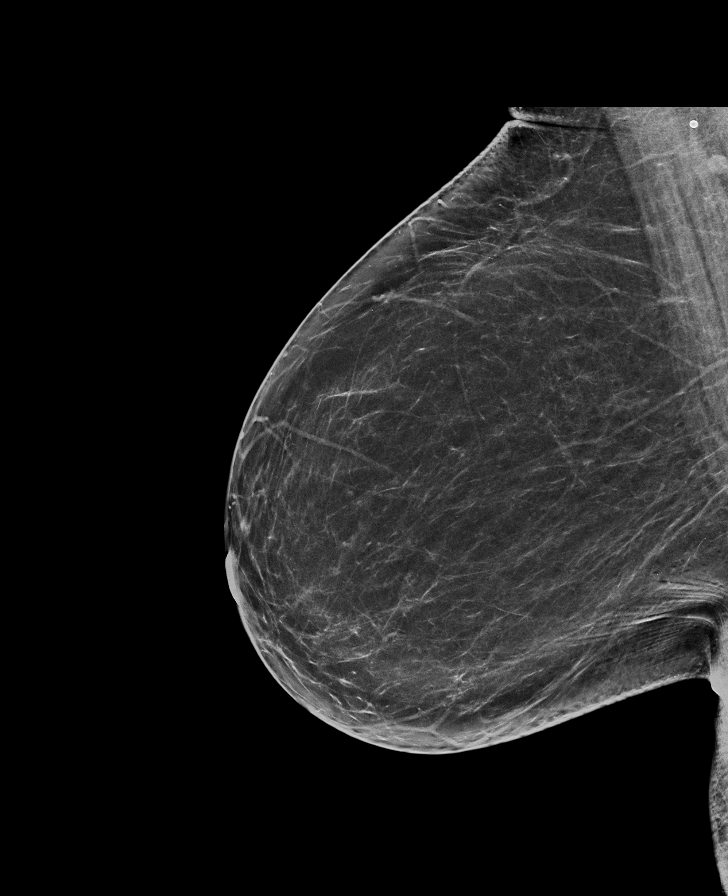

[R MLO tomo · tomo slice 37/73.0]
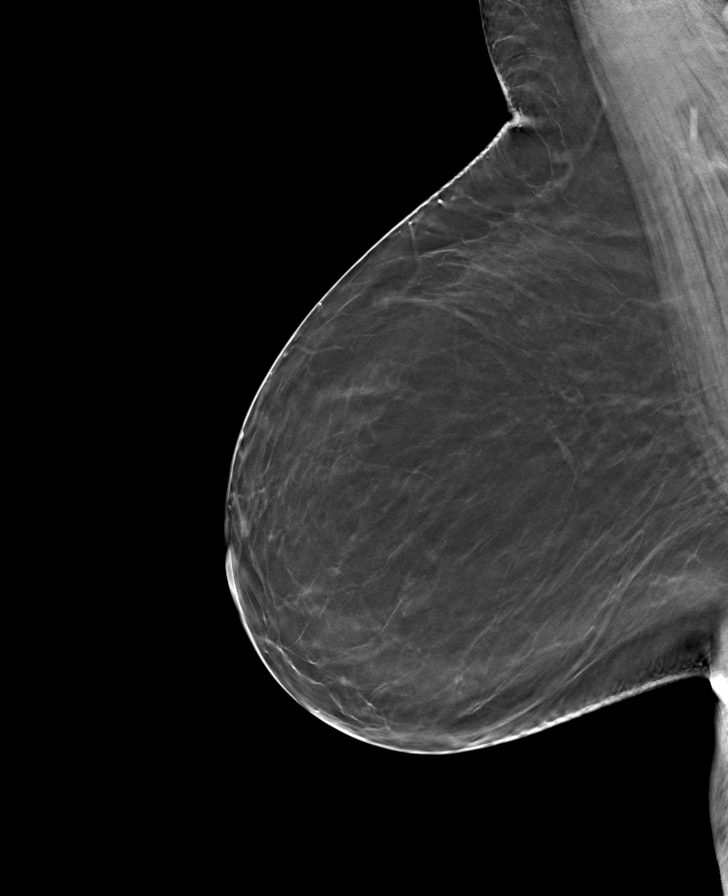

[R CC tomo · tomo slice 33/64.0]
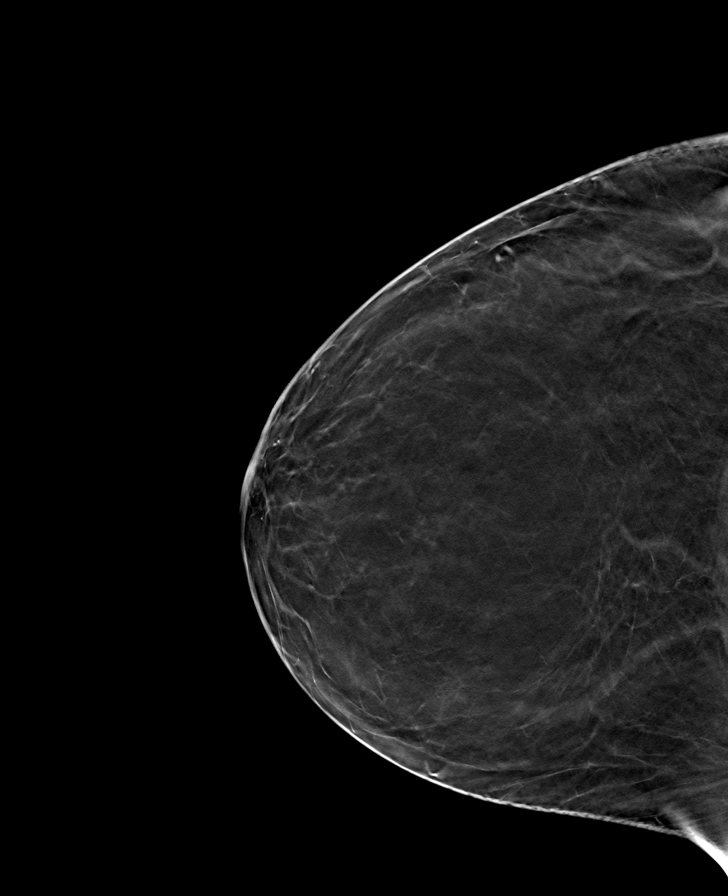

[L CC tomo · tomo slice 33/65.0]
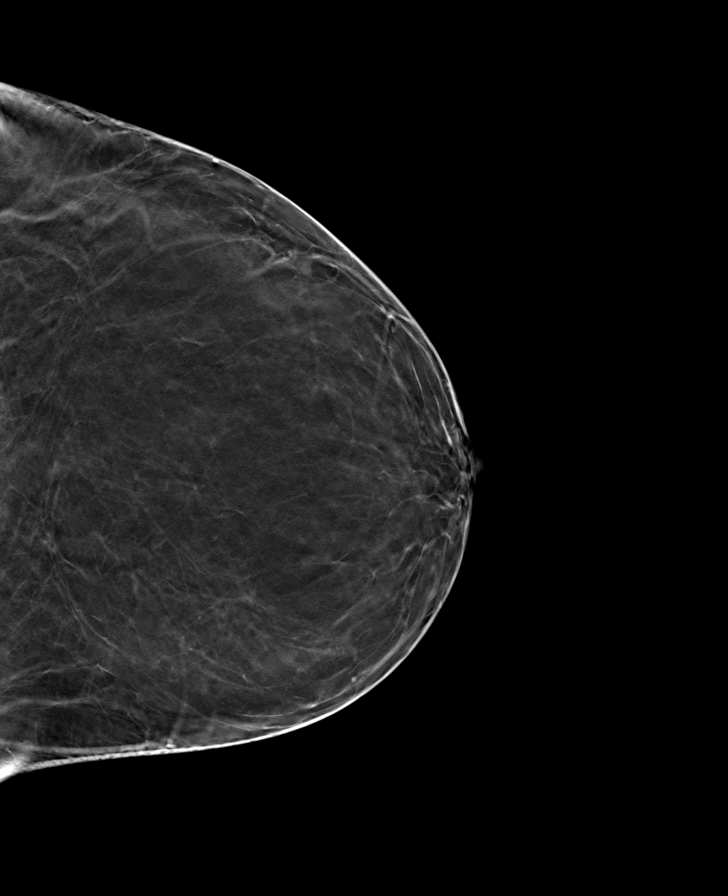

[L MLO tomo · tomo slice 40/79.0]
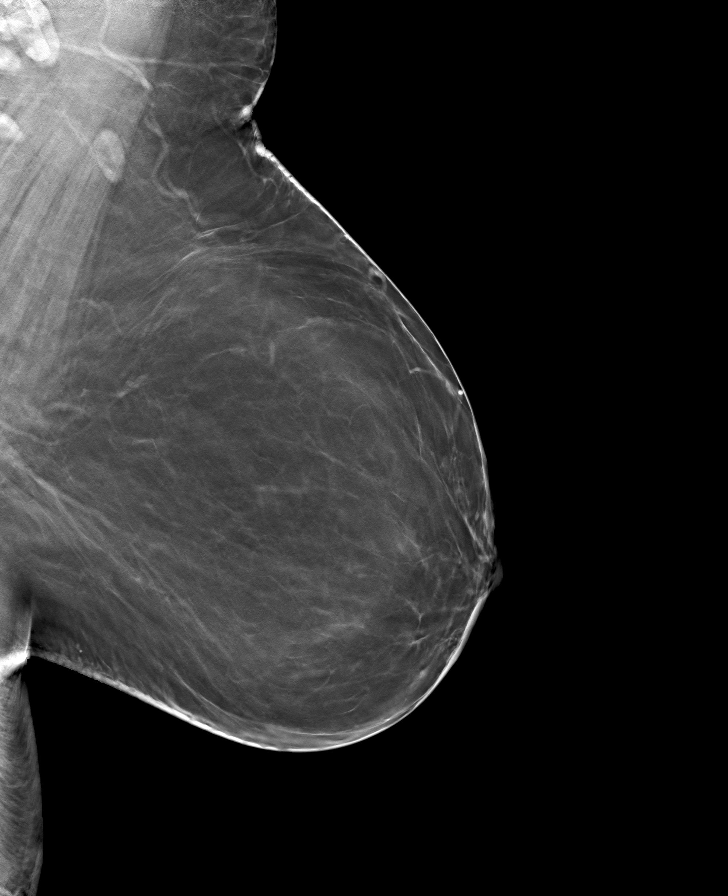

[8 of 24 positions shown; findings below may reference images not displayed]

FINDINGS: Stable mammographic appearance of the breasts with no interval
findings suspicious for malignancy in either breast.

On physical exam, there is an approximately 3 cm long vertical ridge
of mild soft tissue thickening superior to the right breast at the
location the mass and focal stinging sensation felt by the patient.

Targeted ultrasound is performed, showing normal appearing
subcutaneous fatty tissue at the location of patient concern,
including vertically oriented fat lobules. This corresponds to the
vertical ridge of palpable soft tissue thickening.
IMPRESSION: No evidence of malignancy.

RECOMMENDATION:
1. Bilateral screening mammogram in 1 year.
2. Per [employer] guidelines, if the patient has a
calculated lifetime risk of developing breast cancer of greater than
20%, annual screening MRI of the breasts would also be recommended.
This could be alternated with screening mammography every 6 months.

I have discussed the findings and recommendations with the patient.
If applicable, a reminder letter will be sent to the patient
regarding the next appointment.

BI-RADS CATEGORY  1: Negative.

## 2022-12-01 MED ORDER — MINOXIDIL 2.5 MG PO TABS
2.5000 mg | ORAL_TABLET | Freq: Every day | ORAL | 0 refills | Status: DC
  Filled 2022-12-01: qty 90, 90d supply, fill #0

## 2022-12-01 NOTE — Patient Instructions (Signed)
F/U in 10 weeks, call if you need me before  New is minoxidil 2.5 mg  one daily  Take half tablet olmesartan/hCTZ daily, and check blood pressure  It is important that you exercise regularly at least 30 minutes 5 times a week. If you develop chest pain, have severe difficulty breathing, or feel very tired, stop exercising immediately and seek medical attention    Thanks for choosing  [facility], we consider it a privelige to serve you.

## 2022-12-02 ENCOUNTER — Other Ambulatory Visit (HOSPITAL_COMMUNITY): Payer: Self-pay

## 2022-12-02 ENCOUNTER — Encounter: Payer: Self-pay | Admitting: Family Medicine

## 2022-12-02 ENCOUNTER — Other Ambulatory Visit: Payer: Self-pay

## 2022-12-02 MED ORDER — MINOXIDIL 2.5 MG PO TABS: 2.5000 mg | ORAL_TABLET | Freq: Every day | ORAL | 0 refills | Status: DC

## 2022-12-05 ENCOUNTER — Other Ambulatory Visit: Payer: Self-pay

## 2022-12-05 ENCOUNTER — Other Ambulatory Visit (HOSPITAL_COMMUNITY): Payer: Self-pay

## 2022-12-05 DIAGNOSIS — L659 Nonscarring hair loss, unspecified: Secondary | ICD-10-CM | POA: Insufficient documentation

## 2022-12-05 MED ORDER — ESZOPICLONE 3 MG PO TABS
3.0000 mg | ORAL_TABLET | Freq: Every day | ORAL | 3 refills | Status: DC
  Filled 2022-12-05 – 2022-12-28 (×2): qty 30, 30d supply, fill #0

## 2022-12-05 NOTE — Assessment & Plan Note (Addendum)
Sleep hygiene reviewed and written information offered also. Prescription sent for  medication needed. Is now maintained on lunesta 3 mg and hydroxyzine 50 mg Needs to work on sleep hygiene and stress management

## 2022-12-05 NOTE — Assessment & Plan Note (Signed)
Currenrtly increased, insomnia more of a challenge in recent times, change made in med manbagement

## 2022-12-05 NOTE — Assessment & Plan Note (Signed)
Chanbge in therapy toi include minoxidil and decrease dose of olmesartan to counter this Re eval in 6 to 8 weeks BP elevated at visit which is not the  usual pattern DASH diet and commitment to daily physical activity for a minimum of 30 minutes discussed and encouraged, as a part of hypertension management. The importance of attaining a healthy weight is also discussed.     12/01/2022    4:39 PM 12/01/2022    4:22 PM 09/09/2022    2:25 PM 06/04/2022    8:14 AM 04/08/2022    1:28 PM 11/09/2021    4:32 PM 08/06/2021    1:08 PM  BP/Weight  Systolic BP 140 138 123 154 116 116 129  Diastolic BP 90 96 75 96 80 73 85  Wt. (Lbs)  208 209.12  209.12 215 219.08  BMI  31.63 kg/m2 31.8 kg/m2  31.8 kg/m2 32.69 kg/m2 33.31 kg/m2

## 2022-12-05 NOTE — Assessment & Plan Note (Signed)
Oral minoxidil low dose and re eval in 10 weeks

## 2022-12-05 NOTE — Assessment & Plan Note (Signed)
  Patient re-educated about  the importance of commitment to a  minimum of 150 minutes of exercise per week as able.  The importance of healthy food choices with portion control discussed, as well as eating regularly and within a 12 hour window most days. The need to choose "clean , green" food 50 to 75% of the time is discussed, as well as to make water the primary drink and set a goal of 64 ounces water daily.       12/01/2022    4:22 PM 09/09/2022    2:25 PM 04/08/2022    1:28 PM  Weight /BMI  Weight 208 lb 209 lb 1.9 oz 209 lb 1.9 oz  Height 5\' 8"  (1.727 m) 5\' 8"  (1.727 m) 5\' 8"  (1.727 m)  BMI 31.63 kg/m2 31.8 kg/m2 31.8 kg/m2

## 2022-12-05 NOTE — Progress Notes (Signed)
Kristine Lawson     MRN: [id number]      DOB: 1971-05-31  Chief Complaint  Patient presents with   Follow-up    Insomnia / thinning hair    HPI Kristine Lawson is here for follow up and re-evaluation of chronic medical conditions, medication management and review of any available recent lab and radiology data.  Preventive health is updated, specifically  Cancer screening and Immunization.   Questions or concerns regarding consultations or procedures which the PT has had in the interim are  addressed. Requests to start minoxidil for hair loss , dermatologist  was reportedly uncomfoprtable doing this Has problems with sleep still and had requested  that she be kept on 2 medications for this C/o increased stress on the job with a promiotion which she is learning while stilll carrying oput a lot of her oold dutieds, is excited about the opportunity and will do well in it Concerned about weight regain off wegovy esop with stress level up, but will try to control/ avoid this  ROS Denies recent fever or chills. Denies sinus pressure, nasal congestion, ear pain or sore throat. Denies chest congestion, productive cough or wheezing. Denies chest pains, palpitations and leg swelling Denies abdominal pain, nausea, vomiting,diarrhea or constipation.   Denies dysuria, frequency, hesitancy or incontinence. Denies joint pain, swelling and limitation in mobility. Denies headaches, seizures, numbness, or tingling. Denies depression, anxiety or insomnia.  PE  BP (!) 140/90   Pulse 72   Ht 5\' 8"  (1.727 m)   Wt 208 lb (94.3 kg)   SpO2 97%   BMI 31.63 kg/m   Patient alert and oriented and in no cardiopulmonary distress.  HEENT: No facial asymmetry, EOMI,     Neck supple .  Chest: Clear to auscultation bilaterally.  CVS: S1, S2 no murmurs, no S3.Regular rate.   Ext: No edema  MS: Adequate ROM spine, shoulders, hips and knees.  Skin: Intact, no ulcerations or rash noted., thinning hair    Psych: Good eye contact, normal affect. Memory intact mildly  anxious or depressed appearing.  CNS: CN 2-12 intact, power,  normal throughout.no focal deficits noted.   Assessment & Plan  HYPERTENSION, BENIGN ESSENTIAL Chanbge in therapy toi include minoxidil and decrease dose of olmesartan to counter this Re eval in 6 to 8 weeks BP elevated at visit which is not the  usual pattern DASH diet and commitment to daily physical activity for a minimum of 30 minutes discussed and encouraged, as a part of hypertension management. The importance of attaining a healthy weight is also discussed.     12/01/2022    4:39 PM 12/01/2022    4:22 PM 09/09/2022    2:25 PM 06/04/2022    8:14 AM 04/08/2022    1:28 PM 11/09/2021    4:32 PM 08/06/2021    1:08 PM  BP/Weight  Systolic BP 140 138 123 154 116 116 129  Diastolic BP 90 96 75 96 80 73 85  Wt. (Lbs)  208 209.12  209.12 215 219.08  BMI  31.63 kg/m2 31.8 kg/m2  31.8 kg/m2 32.69 kg/m2 33.31 kg/m2       Obesity (BMI 30.0-34.9)  Patient re-educated about  the importance of commitment to a  minimum of 150 minutes of exercise per week as able.  The importance of healthy food choices with portion control discussed, as well as eating regularly and within a 12 hour window most days. The need to choose "clean , green" food 50 to  75% of the time is discussed, as well as to make water the primary drink and set a goal of 64 ounces water daily.       12/01/2022    4:22 PM 09/09/2022    2:25 PM 04/08/2022    1:28 PM  Weight /BMI  Weight 208 lb 209 lb 1.9 oz 209 lb 1.9 oz  Height 5\' 8"  (1.727 m) 5\' 8"  (1.727 m) 5\' 8"  (1.727 m)  BMI 31.63 kg/m2 31.8 kg/m2 31.8 kg/m2      Psychosocial stressors Currenrtly increased, insomnia more of a challenge in recent times, change made in med manbagement  Insomnia Sleep hygiene reviewed and written information offered also. Prescription sent for  medication needed. Is now maintained on lunesta 3 mg and  hydroxyzine 50 mg Needs to work on sleep hygiene and stress management  Itchy scalp Treated by derm wit intraderma l steroid injections, maiuntained on topical steroid  also (PCP)  Hair loss Oral minoxidil low dose and re eval in 10 weeks

## 2022-12-05 NOTE — Assessment & Plan Note (Signed)
Treated by derm wit intraderma l steroid injections, maiuntained on topical steroid  also (PCP)

## 2022-12-12 ENCOUNTER — Encounter: Payer: Self-pay | Admitting: Family Medicine

## 2022-12-12 DIAGNOSIS — L669 Cicatricial alopecia, unspecified: Secondary | ICD-10-CM | POA: Insufficient documentation

## 2022-12-23 ENCOUNTER — Other Ambulatory Visit: Payer: Self-pay

## 2022-12-23 ENCOUNTER — Other Ambulatory Visit (HOSPITAL_COMMUNITY): Payer: Self-pay

## 2022-12-23 ENCOUNTER — Other Ambulatory Visit: Payer: Self-pay | Admitting: Family Medicine

## 2022-12-23 MED ORDER — MINOXIDIL 2.5 MG PO TABS
2.5000 mg | ORAL_TABLET | Freq: Every day | ORAL | 0 refills | Status: DC
  Filled 2022-12-23 – 2022-12-26 (×2): qty 90, 90d supply, fill #0

## 2022-12-24 ENCOUNTER — Other Ambulatory Visit (HOSPITAL_COMMUNITY): Payer: Self-pay

## 2022-12-26 ENCOUNTER — Other Ambulatory Visit (HOSPITAL_COMMUNITY): Payer: Self-pay

## 2022-12-26 ENCOUNTER — Other Ambulatory Visit: Payer: Self-pay

## 2022-12-29 ENCOUNTER — Other Ambulatory Visit: Payer: Self-pay

## 2023-01-04 ENCOUNTER — Other Ambulatory Visit: Payer: Self-pay

## 2023-01-04 DIAGNOSIS — L668 Other cicatricial alopecia: Secondary | ICD-10-CM | POA: Diagnosis not present

## 2023-01-27 ENCOUNTER — Other Ambulatory Visit: Payer: Self-pay

## 2023-01-27 ENCOUNTER — Other Ambulatory Visit: Payer: Self-pay | Admitting: Family Medicine

## 2023-01-27 MED ORDER — OLMESARTAN MEDOXOMIL-HCTZ 20-12.5 MG PO TABS
1.0000 | ORAL_TABLET | Freq: Every day | ORAL | 3 refills | Status: DC
  Filled 2023-01-27: qty 90, 90d supply, fill #0
  Filled 2023-04-24: qty 90, 90d supply, fill #1
  Filled 2023-08-07: qty 90, 90d supply, fill #2

## 2023-02-01 ENCOUNTER — Other Ambulatory Visit: Payer: Self-pay | Admitting: Oncology

## 2023-02-01 DIAGNOSIS — Z006 Encounter for examination for normal comparison and control in clinical research program: Secondary | ICD-10-CM

## 2023-02-01 DIAGNOSIS — L668 Other cicatricial alopecia: Secondary | ICD-10-CM | POA: Diagnosis not present

## 2023-02-14 ENCOUNTER — Other Ambulatory Visit: Payer: Self-pay

## 2023-02-21 ENCOUNTER — Encounter: Payer: Self-pay | Admitting: Family Medicine

## 2023-02-21 ENCOUNTER — Other Ambulatory Visit (HOSPITAL_COMMUNITY): Payer: Self-pay

## 2023-02-21 MED ORDER — HYDROXYZINE PAMOATE 50 MG PO CAPS
100.0000 mg | ORAL_CAPSULE | Freq: Every day | ORAL | 1 refills | Status: DC
  Filled 2023-02-21: qty 180, fill #0
  Filled 2023-03-02: qty 180, 90d supply, fill #0
  Filled 2023-05-31: qty 180, 90d supply, fill #1

## 2023-03-02 ENCOUNTER — Other Ambulatory Visit (HOSPITAL_COMMUNITY): Payer: Self-pay

## 2023-03-03 ENCOUNTER — Other Ambulatory Visit (HOSPITAL_COMMUNITY): Payer: Self-pay

## 2023-03-03 ENCOUNTER — Other Ambulatory Visit: Payer: Self-pay

## 2023-03-08 ENCOUNTER — Other Ambulatory Visit (HOSPITAL_COMMUNITY): Payer: Self-pay

## 2023-03-25 ENCOUNTER — Other Ambulatory Visit: Payer: Self-pay | Admitting: Family Medicine

## 2023-03-28 ENCOUNTER — Encounter: Payer: Self-pay | Admitting: Pharmacist

## 2023-03-28 ENCOUNTER — Other Ambulatory Visit (HOSPITAL_COMMUNITY): Payer: Self-pay

## 2023-03-28 ENCOUNTER — Other Ambulatory Visit: Payer: Self-pay | Admitting: Family Medicine

## 2023-03-28 ENCOUNTER — Other Ambulatory Visit: Payer: Self-pay

## 2023-03-28 MED ORDER — MINOXIDIL 2.5 MG PO TABS
2.5000 mg | ORAL_TABLET | Freq: Every day | ORAL | 0 refills | Status: DC
  Filled 2023-03-28: qty 90, 90d supply, fill #0

## 2023-03-28 MED ORDER — PANTOPRAZOLE SODIUM 40 MG PO TBEC
40.0000 mg | DELAYED_RELEASE_TABLET | Freq: Every day | ORAL | 2 refills | Status: DC
  Filled 2023-03-28: qty 90, 90d supply, fill #0

## 2023-03-29 ENCOUNTER — Other Ambulatory Visit (HOSPITAL_COMMUNITY): Payer: Self-pay

## 2023-03-29 MED ORDER — ACYCLOVIR 400 MG PO TABS
400.0000 mg | ORAL_TABLET | Freq: Every day | ORAL | 3 refills | Status: DC
  Filled 2023-03-29: qty 90, 90d supply, fill #0
  Filled 2023-05-31 – 2023-07-03 (×2): qty 90, 90d supply, fill #1
  Filled 2023-09-16: qty 90, 90d supply, fill #2
  Filled 2023-12-15: qty 90, 90d supply, fill #3

## 2023-03-30 ENCOUNTER — Other Ambulatory Visit (HOSPITAL_COMMUNITY): Payer: Self-pay

## 2023-03-30 ENCOUNTER — Encounter: Payer: Self-pay | Admitting: Family Medicine

## 2023-03-30 ENCOUNTER — Ambulatory Visit: Payer: Commercial Managed Care - PPO | Admitting: Family Medicine

## 2023-03-30 VITALS — BP 128/81 | HR 71 | Ht 68.0 in | Wt 224.0 lb

## 2023-03-30 DIAGNOSIS — L669 Cicatricial alopecia, unspecified: Secondary | ICD-10-CM | POA: Diagnosis not present

## 2023-03-30 DIAGNOSIS — E049 Nontoxic goiter, unspecified: Secondary | ICD-10-CM

## 2023-03-30 DIAGNOSIS — I10 Essential (primary) hypertension: Secondary | ICD-10-CM | POA: Diagnosis not present

## 2023-03-30 DIAGNOSIS — F900 Attention-deficit hyperactivity disorder, predominantly inattentive type: Secondary | ICD-10-CM | POA: Diagnosis not present

## 2023-03-30 DIAGNOSIS — E669 Obesity, unspecified: Secondary | ICD-10-CM | POA: Diagnosis not present

## 2023-03-30 DIAGNOSIS — F909 Attention-deficit hyperactivity disorder, unspecified type: Secondary | ICD-10-CM | POA: Insufficient documentation

## 2023-03-30 DIAGNOSIS — G8929 Other chronic pain: Secondary | ICD-10-CM

## 2023-03-30 DIAGNOSIS — F5104 Psychophysiologic insomnia: Secondary | ICD-10-CM

## 2023-03-30 DIAGNOSIS — E559 Vitamin D deficiency, unspecified: Secondary | ICD-10-CM | POA: Diagnosis not present

## 2023-03-30 DIAGNOSIS — J351 Hypertrophy of tonsils: Secondary | ICD-10-CM

## 2023-03-30 DIAGNOSIS — E538 Deficiency of other specified B group vitamins: Secondary | ICD-10-CM

## 2023-03-30 DIAGNOSIS — Z1231 Encounter for screening mammogram for malignant neoplasm of breast: Secondary | ICD-10-CM | POA: Diagnosis not present

## 2023-03-30 DIAGNOSIS — J309 Allergic rhinitis, unspecified: Secondary | ICD-10-CM

## 2023-03-30 DIAGNOSIS — M5442 Lumbago with sciatica, left side: Secondary | ICD-10-CM

## 2023-03-30 DIAGNOSIS — E785 Hyperlipidemia, unspecified: Secondary | ICD-10-CM | POA: Diagnosis not present

## 2023-03-30 MED ORDER — LISDEXAMFETAMINE DIMESYLATE 20 MG PO CAPS
20.0000 mg | ORAL_CAPSULE | Freq: Every day | ORAL | 0 refills | Status: AC
Start: 2023-03-30 — End: ?
  Filled 2023-03-30: qty 30, 30d supply, fill #0

## 2023-03-30 NOTE — Assessment & Plan Note (Signed)
Controlled, no change in medication Sleep hygiene reviewed and written information offered also. Prescription sent for  medication needed.  

## 2023-03-30 NOTE — Assessment & Plan Note (Signed)
Vyvanse 20 mg started, re eval in 2 months

## 2023-03-30 NOTE — Assessment & Plan Note (Signed)
Rept [place] and refer eNT

## 2023-03-30 NOTE — Patient Instructions (Signed)
F/u in 8 weeks , re evaluate chronic illness, call if you need me sooner  Please schedule mammogram at checkout   Fasting cBC, lipid, cmp and eGFR, HBA!c, tSH, vit d Vit b12 asap-  New medication for ADHD  Control food for first 9 hrs of the day and the rest will follow  Please schedule and get pap  Please send referral info needed if necessary for GI evaluation  Thanks for choosing [facility], we consider it a privelige to serve you.

## 2023-03-30 NOTE — Assessment & Plan Note (Signed)
Good response to minoxidil and steroid injections

## 2023-03-30 NOTE — Assessment & Plan Note (Signed)
Tonsil stones and enlarged tonsil wants eNT eval

## 2023-03-30 NOTE — Progress Notes (Signed)
   Kristine Lawson     MRN: [id number]      DOB: 1970-09-10  Chief Complaint  Patient presents with   Follow-up    Follow up bp / weight/ hair loss    HPI [name] is here for follow up and re-evaluation of chronic medical conditions, medication management and review of any available recent lab and radiology data.  Preventive health is updated, specifically  Cancer screening and Immunization.  Needs mammogram and pap, a;also needs to f/u on thyroid gland Good response to minoxidil and injections for alopecia, waxes excess facial hair C/O weight gain off medication, does report eating ad lib durinf work hours as she copes wih new position and associated atress States feels she has ADHD and needs  treatment C/o enlarged tonsils , recurrent tonsil stones which are painful and needs f/u on thyroid gland, wants to address sooner rather than later ROS Denies recent fever or chills. Denies sinus pressure, nasal congestion, ear pain or sore throat. Denies chest congestion, productive cough or wheezing. Denies chest pains, palpitations and leg swelling Denies abdominal pain, nausea, vomiting,diarrhea or constipation.   Denies dysuria, frequency, hesitancy or incontinence. C/o increased joint pain, swelling and limitation in mobility.since  no longer has access to wegovy Denies headaches, seizures, numbness, or tingling. Denies depression, anxiety , good control of  insomnia on current meds Denies skin break down or rash.   PE  BP 128/81 (BP Location: Right Arm, Patient Position: Sitting, Cuff Size: Normal)   Pulse 71   Ht 5\' 8"  (1.727 m)   Wt 224 lb 0.6 oz (101.6 kg)   SpO2 98%   BMI 34.07 kg/m   Patient alert and oriented and in no cardiopulmonary distress.  HEENT: No facial asymmetry, EOMI,     Neck supple .Goiter  Chest: Clear to auscultation bilaterally.  CVS: S1, S2 no murmurs, no S3.Regular rate.  ABD: Soft non tender.   Ext: No edema  MS: Adequate though reduced   ROM spine, shoulders, hips and knees.  Skin: Intact, no ulcerations or rash noted.  Psych: Good eye contact, normal affect. Memory intact not anxious or depressed appearing.  CNS: CN 2-12 intact, no focal deficits noted.   Assessment & Plan Goiter Rept [place] and refer eNT  Enlarged tonsils Tonsil stones and enlarged tonsil wants eNT eval  Scarring alopecia Good response to minoxidil and steroid injections  Low back pain with left-sided sciatica Ineaaed with weight gain  Insomnia Controlled, no change in medication Sleep hygiene reviewed and written information offered also. Prescription sent for  medication needed.   Allergic rhinitis Controlled, no change in medication   ADHD Vyvanse 20 mg started, re eval in 2 months

## 2023-03-30 NOTE — Assessment & Plan Note (Signed)
Ineaaed with weight gain

## 2023-03-30 NOTE — Assessment & Plan Note (Signed)
Controlled, no change in medication  

## 2023-04-24 ENCOUNTER — Other Ambulatory Visit (HOSPITAL_COMMUNITY): Payer: Self-pay

## 2023-04-24 ENCOUNTER — Other Ambulatory Visit: Payer: Self-pay | Admitting: Family Medicine

## 2023-04-24 DIAGNOSIS — F900 Attention-deficit hyperactivity disorder, predominantly inattentive type: Secondary | ICD-10-CM

## 2023-04-25 ENCOUNTER — Other Ambulatory Visit: Payer: Self-pay | Admitting: Family Medicine

## 2023-04-25 ENCOUNTER — Other Ambulatory Visit: Payer: Self-pay

## 2023-04-25 DIAGNOSIS — J309 Allergic rhinitis, unspecified: Secondary | ICD-10-CM

## 2023-04-25 MED ORDER — LISDEXAMFETAMINE DIMESYLATE 20 MG PO CAPS
20.0000 mg | ORAL_CAPSULE | Freq: Every day | ORAL | 0 refills | Status: DC
Start: 2023-04-25 — End: 2023-05-25
  Filled 2023-04-25 – 2023-04-26 (×2): qty 30, 30d supply, fill #0

## 2023-04-26 ENCOUNTER — Other Ambulatory Visit (HOSPITAL_COMMUNITY): Payer: Self-pay

## 2023-04-27 ENCOUNTER — Encounter: Payer: Self-pay | Admitting: Family Medicine

## 2023-04-27 ENCOUNTER — Other Ambulatory Visit: Payer: Self-pay

## 2023-05-18 ENCOUNTER — Ambulatory Visit (HOSPITAL_COMMUNITY)
Admission: RE | Admit: 2023-05-18 | Discharge: 2023-05-18 | Disposition: A | Discharge status: Home / Self Care | Payer: Commercial Managed Care - PPO | Source: Ambulatory Visit | Attending: Family Medicine | Admitting: Family Medicine

## 2023-05-18 ENCOUNTER — Encounter (HOSPITAL_COMMUNITY): Payer: Self-pay

## 2023-05-18 DIAGNOSIS — E785 Hyperlipidemia, unspecified: Secondary | ICD-10-CM | POA: Diagnosis not present

## 2023-05-18 DIAGNOSIS — E669 Obesity, unspecified: Secondary | ICD-10-CM | POA: Diagnosis not present

## 2023-05-18 DIAGNOSIS — I10 Essential (primary) hypertension: Secondary | ICD-10-CM | POA: Diagnosis not present

## 2023-05-18 DIAGNOSIS — E538 Deficiency of other specified B group vitamins: Secondary | ICD-10-CM | POA: Diagnosis not present

## 2023-05-18 DIAGNOSIS — E559 Vitamin D deficiency, unspecified: Secondary | ICD-10-CM | POA: Diagnosis not present

## 2023-05-18 DIAGNOSIS — Z1231 Encounter for screening mammogram for malignant neoplasm of breast: Secondary | ICD-10-CM | POA: Insufficient documentation

## 2023-05-19 ENCOUNTER — Institutional Professional Consult (permissible substitution) (INDEPENDENT_AMBULATORY_CARE_PROVIDER_SITE_OTHER): Payer: Commercial Managed Care - PPO | Admitting: Otolaryngology

## 2023-05-19 LAB — TSH: TSH: 0.552 u[IU]/mL (ref 0.450–4.500)

## 2023-05-19 LAB — CMP14+EGFR
ALT: 25 [IU]/L (ref 0–32)
AST: 30 [IU]/L (ref 0–40)
Albumin: 4.1 g/dL (ref 3.8–4.9)
Alkaline Phosphatase: 55 [IU]/L (ref 44–121)
BUN/Creatinine Ratio: 15 (ref 9–23)
BUN: 14 mg/dL (ref 6–24)
Bilirubin Total: 0.4 mg/dL (ref 0.0–1.2)
CO2: 28 mmol/L (ref 20–29)
Calcium: 9.3 mg/dL (ref 8.7–10.2)
Chloride: 100 mmol/L (ref 96–106)
Creatinine, Ser: 0.93 mg/dL (ref 0.57–1.00)
Globulin, Total: 2.6 g/dL (ref 1.5–4.5)
Glucose: 93 mg/dL (ref 70–99)
Potassium: 4.5 mmol/L (ref 3.5–5.2)
Sodium: 138 mmol/L (ref 134–144)
Total Protein: 6.7 g/dL (ref 6.0–8.5)
eGFR: 74 mL/min/{1.73_m2} (ref >59)

## 2023-05-19 LAB — CBC
Hematocrit: 39.5 % (ref 34.0–46.6)
Hemoglobin: 12.8 g/dL (ref 11.1–15.9)
MCH: 29.4 pg (ref 26.6–33.0)
MCHC: 32.4 g/dL (ref 31.5–35.7)
MCV: 91 fL (ref 79–97)
Platelets: 343 10*3/uL (ref 150–450)
RBC: 4.35 x10E6/uL (ref 3.77–5.28)
RDW: 12.9 % (ref 11.7–15.4)
WBC: 3.8 10*3/uL (ref 3.4–10.8)

## 2023-05-19 LAB — LIPID PANEL
Chol/HDL Ratio: 2.6 {ratio} (ref 0.0–4.4)
Cholesterol, Total: 165 mg/dL (ref 100–199)
HDL: 64 mg/dL (ref >39)
LDL Chol Calc (NIH): 92 mg/dL (ref 0–99)
Triglycerides: 44 mg/dL (ref 0–149)
VLDL Cholesterol Cal: 9 mg/dL (ref 5–40)

## 2023-05-19 LAB — HEMOGLOBIN A1C
Est. average glucose Bld gHb Est-mCnc: 111 mg/dL
Hgb A1c MFr Bld: 5.5 % (ref 4.8–5.6)

## 2023-05-19 LAB — VITAMIN B12: Vitamin B-12: 723 pg/mL (ref 232–1245)

## 2023-05-19 LAB — VITAMIN D 25 HYDROXY (VIT D DEFICIENCY, FRACTURES): Vit D, 25-Hydroxy: 34.6 ng/mL (ref 30.0–100.0)

## 2023-05-25 ENCOUNTER — Ambulatory Visit: Payer: Commercial Managed Care - PPO | Admitting: Family Medicine

## 2023-05-25 ENCOUNTER — Encounter: Payer: Self-pay | Admitting: Family Medicine

## 2023-05-25 ENCOUNTER — Other Ambulatory Visit: Payer: Self-pay

## 2023-05-25 VITALS — BP 116/76 | HR 74 | Ht 68.0 in | Wt 215.1 lb

## 2023-05-25 DIAGNOSIS — I10 Essential (primary) hypertension: Secondary | ICD-10-CM

## 2023-05-25 DIAGNOSIS — E66811 Obesity, class 1: Secondary | ICD-10-CM | POA: Diagnosis not present

## 2023-05-25 DIAGNOSIS — F5104 Psychophysiologic insomnia: Secondary | ICD-10-CM

## 2023-05-25 DIAGNOSIS — F900 Attention-deficit hyperactivity disorder, predominantly inattentive type: Secondary | ICD-10-CM

## 2023-05-25 MED ORDER — LISDEXAMFETAMINE DIMESYLATE 40 MG PO CAPS
40.0000 mg | ORAL_CAPSULE | ORAL | 0 refills | Status: DC
  Filled 2023-05-25: qty 90, 90d supply, fill #0

## 2023-05-25 NOTE — Assessment & Plan Note (Signed)
Improved with lifestyle management, continue same  Patient re-educated about  the importance of commitment to a  minimum of 150 minutes of exercise per week as able.  The importance of healthy food choices with portion control discussed, as well as eating regularly and within a 12 hour window most days. The need to choose "clean , green" food 50 to 75% of the time is discussed, as well as to make water the primary drink and set a goal of 64 ounces water daily.       05/25/2023    8:41 AM 03/30/2023    8:09 AM 12/01/2022    4:22 PM  Weight /BMI  Weight 215 lb 1.3 oz 224 lb 0.6 oz 208 lb  Height 5\' 8"  (1.727 m) 5\' 8"  (1.727 m) 5\' 8"  (1.727 m)  BMI 32.7 kg/m2 34.07 kg/m2 31.63 kg/m2

## 2023-05-25 NOTE — Assessment & Plan Note (Signed)
Controlled, no change in medication DASH diet and commitment to daily physical activity for a minimum of 30 minutes discussed and encouraged, as a part of hypertension management. The importance of attaining a healthy weight is also discussed.     05/25/2023    8:41 AM 03/30/2023    8:09 AM 12/01/2022    4:39 PM 12/01/2022    4:22 PM 09/09/2022    2:25 PM 06/04/2022    8:14 AM 04/08/2022    1:28 PM  BP/Weight  Systolic BP 116 128 140 138 123 154 116  Diastolic BP 76 81 90 96 75 96 80  Wt. (Lbs) 215.08 224.04  208 209.12  209.12  BMI 32.7 kg/m2 34.07 kg/m2  31.63 kg/m2 31.8 kg/m2  31.8 kg/m2

## 2023-05-25 NOTE — Progress Notes (Signed)
Kristine Lawson     MRN: [id number]      DOB: [birth year]  Chief Complaint  Patient presents with   Follow-up    Follow up    HPI [name] is here for follow up and re-evaluation of chronic medical conditions, medication management and review of any available recent lab and radiology data.  Preventive health is updated, specifically  Cancer screening and Immunization.  Needs pap and covid vaccine Notes good response to vyvanse , improved level of function, no adverse s/e noted Has joined weight watchers on line an is exercising with excellent weight loss, sleep and level of functioning is also improved  ROS Denies recent fever or chills. Denies sinus pressure, nasal congestion, ear pain or sore throat. Denies chest congestion, productive cough or wheezing. Denies chest pains, palpitations and leg swelling Denies abdominal pain, nausea, vomiting,diarrhea or constipation.   Denies dysuria, frequency, hesitancy or incontinence. Denies uncontrolled joint pain, swelling and limitation in mobility. Denies headaches, seizures, numbness, or tingling. Denies depression, anxiety or insomnia. Denies skin break down or rash.   PE  BP 116/76 (BP Location: Right Arm, Patient Position: Sitting, Cuff Size: Large)   Pulse 74   Ht 5\' 8"  (1.727 m)   Wt 215 lb 1.3 oz (97.6 kg)   SpO2 96%   BMI 32.70 kg/m   Patient alert and oriented and in no cardiopulmonary distress.  HEENT: No facial asymmetry, EOMI,     Neck supple .  Chest: Clear to auscultation bilaterally.  CVS: S1, S2 no murmurs, no S3.Regular rate.   Ext: No edema  MS: Adequate ROM spine, shoulders, hips and knees.  Skin: Intact, no ulcerations or rash noted.  Psych: Good eye contact, normal affect. Memory intact not anxious or depressed appearing.  CNS: CN 2-12 intact, power,  normal throughout.no focal deficits noted.   Assessment & Plan  ADHD Excellent response to medication, still very symptomatic. Increase  vyvanse dose and reassess  in 90 days  HYPERTENSION, BENIGN ESSENTIAL Controlled, no change in medication DASH diet and commitment to daily physical activity for a minimum of 30 minutes discussed and encouraged, as a part of hypertension management. The importance of attaining a healthy weight is also discussed.     05/25/2023    8:41 AM 03/30/2023    8:09 AM 12/01/2022    4:39 PM 12/01/2022    4:22 PM 09/09/2022    2:25 PM 06/04/2022    8:14 AM 04/08/2022    1:28 PM  BP/Weight  Systolic BP 116 128 140 138 123 154 116  Diastolic BP 76 81 90 96 75 96 80  Wt. (Lbs) 215.08 224.04  208 209.12  209.12  BMI 32.7 kg/m2 34.07 kg/m2  31.63 kg/m2 31.8 kg/m2  31.8 kg/m2       Obesity (BMI 30.0-34.9) Improved with lifestyle management, continue same  Patient re-educated about  the importance of commitment to a  minimum of 150 minutes of exercise per week as able.  The importance of healthy food choices with portion control discussed, as well as eating regularly and within a 12 hour window most days. The need to choose "clean , green" food 50 to 75% of the time is discussed, as well as to make water the primary drink and set a goal of 64 ounces water daily.       05/25/2023    8:41 AM 03/30/2023    8:09 AM 12/01/2022    4:22 PM  Weight /BMI  Weight 215  lb 1.3 oz 224 lb 0.6 oz 208 lb  Height 5\' 8"  (1.727 m) 5\' 8"  (1.727 m) 5\' 8"  (1.727 m)  BMI 32.7 kg/m2 34.07 kg/m2 31.63 kg/m2      Insomnia Improved with lifestyle change which involves regular exercise , no med change

## 2023-05-25 NOTE — Patient Instructions (Signed)
F/u in 12 weeks, call if you need me sooner  Please get your pap smear   CONGRATS on excellent lifestyle change  Thanks for choosing [facility], we consider it a privelige to serve you.

## 2023-05-25 NOTE — Assessment & Plan Note (Signed)
Excellent response to medication, still very symptomatic. Increase vyvanse dose and reassess  in 90 days

## 2023-05-25 NOTE — Assessment & Plan Note (Signed)
Improved with lifestyle change which involves regular exercise , no med change

## 2023-05-26 ENCOUNTER — Ambulatory Visit (INDEPENDENT_AMBULATORY_CARE_PROVIDER_SITE_OTHER): Payer: Commercial Managed Care - PPO | Admitting: Otolaryngology

## 2023-05-26 ENCOUNTER — Encounter (INDEPENDENT_AMBULATORY_CARE_PROVIDER_SITE_OTHER): Payer: Self-pay

## 2023-05-26 VITALS — Ht 68.0 in | Wt 211.0 lb

## 2023-05-26 DIAGNOSIS — J358 Other chronic diseases of tonsils and adenoids: Secondary | ICD-10-CM | POA: Diagnosis not present

## 2023-05-26 DIAGNOSIS — R1314 Dysphagia, pharyngoesophageal phase: Secondary | ICD-10-CM | POA: Diagnosis not present

## 2023-05-26 DIAGNOSIS — E042 Nontoxic multinodular goiter: Secondary | ICD-10-CM

## 2023-05-26 DIAGNOSIS — R09A2 Foreign body sensation, throat: Secondary | ICD-10-CM

## 2023-05-26 NOTE — Patient Instructions (Signed)
I have ordered an imaging study for you to complete prior to your next visit. Please call [facility] Scheduling at [phone] to schedule your imaging if you have not received a call within 24 hours. If you are unable to complete your imaging study prior to your next scheduled visit please call our office to let us know.

## 2023-05-26 NOTE — Progress Notes (Signed)
Dear Dr. Lodema Hong, Here is my assessment for our mutual patient, Kristine Lawson. Thank you for allowing me the opportunity to care for your patient. Please do not hesitate to contact me should you have any other questions. Sincerely, Dr. Jovita Kussmaul  Otolaryngology Clinic Note Referring provider: Dr. Lodema Hong HPI:  Kristine Lawson is a 52 y.o. female kindly referred by Dr. Lodema Hong for evaluation of goiter and enlarged tonsils and dysphagia  Goiter:  Does not notice it. Asymptomatic Patient denies: no pain, pressure, trouble breathing Compressive symptoms: denies (but see below) Hypo or hyperthyroid symptoms: denies  Saw Dr. Suszanne Conners in the past.  Prior evaluation has included: labs, [place], Biopsy  History of radiation to H&N: no Family history of thyroid cancer: no  Tonsil stones: problem all her life, she reports now she can't get them out. She has a foul odor when they are apparent. Usually picks them out. Does not do anything besides just oral care. No prior abx, no prior PTAs, not been diagnosed with strep throat frequently. Of note, she reports that she has had some trouble swallowing for about a year. She reports that it is mostly associated with pill dysphagia and some coughing. She reports that she feels like things are going down the wrong pipe. Also gets strangled on water. No voice change, cough, hemoptysis, unintentional weight loss, neck masses, odynophagia. She wonders if it is related to her tonsils.  Never had a swallow eval She takes pantoprazole three times per week for GERD  She is a nurse  H&N Surgery: no Personal or FHx of bleeding dz or anesthesia difficulty: no  GLP-1: no AP/AC: no  Independent Review of Additional Tests or Records:  Thyroid US (2017 and 2019): appear well demarcated, agree with read 2019:   Biopsy 2017:   Referral notes reviewed TSH 2024: wnl  PMH/Meds/All/SocHx/FamHx/ROS:   Past Medical History:  Diagnosis Date   Allergy    Anemia     IDA   Arthritis    Arthritis    Family history of breast cancer    Family history of cancer of gallbladder    Family history of cervical cancer    Family history of lung cancer    Family history of prostate cancer    Family history of throat cancer    GERD (gastroesophageal reflux disease)    History of stomach ulcers    Hypertension    Insomnia    meds needed daily to achieve 6+ hours sleep.   Trapezius strain 11/13/2019   Ureteral stone with hydronephrosis 05/09/2020     Past Surgical History:  Procedure Laterality Date   ABLATION     uterine ablation   APPENDECTOMY     BIOPSY  12/18/2019   Procedure: BIOPSY;  Surgeon: Malissa Hippo, MD;  [facility];  Service: Endoscopy;;  gastric   BREATH TEK H PYLORI N/A 11/18/2014   Procedure: BREATH TEK Richardo Priest;  Surgeon: Gaynelle Adu, MD;  Location: [facility];  Service: General;  Laterality: N/A;   CHOLECYSTECTOMY     COLONOSCOPY N/A 12/18/2019   Procedure: COLONOSCOPY;  Surgeon: Malissa Hippo, MD;  [facility]: [facility];  Service: Endoscopy;  Laterality: N/A;  930   CYSTOSCOPY W/ RETROGRADES Right 05/07/2020   Procedure: CYSTOSCOPY WITH RIGHT RETROGRADE PYELOGRAM;  Surgeon: Malen Gauze, MD;  Location: [facility];  Service: Urology;  Laterality: Right;   CYSTOSCOPY W/ URETERAL STENT PLACEMENT Right 05/07/2020   Procedure: CYSTOSCOPY WITH RIGHT URETERAL STENT REPLACEMENT;  Surgeon: Malen Gauze, MD;  Location: [facility];  Service: Urology;  Laterality: Right;   ESOPHAGOGASTRODUODENOSCOPY N/A 10/01/2018   Procedure: ESOPHAGOGASTRODUODENOSCOPY (EGD);  Surgeon: Malissa Hippo, MD;  [facility];  Service: Endoscopy;  Laterality: N/A;   ESOPHAGOGASTRODUODENOSCOPY N/A 12/18/2019   Procedure: ESOPHAGOGASTRODUODENOSCOPY (EGD);  Surgeon: Malissa Hippo, MD;  [facility];  Service: Endoscopy;  Laterality: N/A;   EXTRACORPOREAL SHOCK WAVE LITHOTRIPSY Right 05/12/2020   Procedure: EXTRACORPOREAL  SHOCK WAVE LITHOTRIPSY (ESWL);  Surgeon: Malen Gauze, MD;  Location: [facility];  Service: Urology;  Laterality: Right;   HERNIA REPAIR N/A    Phreesia 11/10/2020   LAPAROSCOPIC ROUX-EN-Y GASTRIC BYPASS WITH HIATAL HERNIA REPAIR N/A 02/10/2015   Procedure: LAPAROSCOPIC ROUX-EN-Y GASTRIC BYPASS ;  Surgeon: Gaynelle Adu, MD;  Location: [facility];  Service: General;  Laterality: N/A;   TUBAL LIGATION     UPPER GI ENDOSCOPY N/A 02/10/2015   Procedure: UPPER GI ENDOSCOPY;  Surgeon: Gaynelle Adu, MD;  Location: [facility];  Service: General;  Laterality: N/A;    Family History  Problem Relation Age of Onset   Hypertension Mother    Diabetes Mother    Kidney disease Mother    Hyperlipidemia Mother    Colon cancer Mother 47       highly suspious of colon mass   Breast cancer Mother 51   Kidney disease Father    Stroke Father    Heart disease Father    Hyperlipidemia Father    Heart attack Father    Diverticulitis Father    Hypertension Brother    Diabetes Brother    Healthy Sister    Other Daughter        hypertension   Kidney disease Daughter    Blindness Daughter    Healthy Daughter    Healthy Son    Cancer Maternal Aunt 66       gallbladder cancer   Breast cancer Paternal Aunt        dx. in her 72s   Cervical cancer Maternal Grandmother        dx. in her 30s   Prostate cancer Maternal Grandfather        metastatic   Heart attack Paternal Grandmother    Hypertension Paternal Grandmother    Stroke Paternal Grandmother    Lung cancer Paternal Grandfather    Throat cancer Paternal Grandfather    Breast cancer Paternal Aunt        dx. in her 31s   Breast cancer Paternal Aunt        dx. in her 63s   Throat cancer Paternal Uncle    Lung cancer Paternal Uncle    Breast cancer Other        dx. 50s or 60s (mother's first cousins x3)   SIDS Maternal Aunt    Prostate cancer Maternal Uncle        dx. in his 78s, not metastatic   Prostate cancer Maternal Uncle        dx. in his 32s,  not metastatic   Breast cancer Cousin        dx. late 63s (paternal cousin)   Breast cancer Other        dx. 40s/early 42s (maternal second cousins x2)     Social Connections: Socially Integrated (05/24/2023)   Social Connection and Isolation Panel [NHANES]    Frequency of Communication with Friends and Family: More than three times a week    Frequency of Social  Gatherings with Friends and Family: Once a week    Attends Religious Services: More than 4 times per year    Active Member of Clubs or Organizations: Yes    Attends Engineer, structural: More than 4 times per year    Marital Status: Married     Current Outpatient Medications:    acyclovir (ZOVIRAX) 400 MG tablet, Take 1 tablet (400 mg total) by mouth daily., Disp: 90 tablet, Rfl: 3   Calcium Citrate (CITRACAL PO), Take 500 mg by mouth daily. , Disp: , Rfl:    clobetasol (TEMOVATE) 0.05 % external solution, Apply topically 2 times daily., Disp: 50 mL, Rfl: 1   cyclobenzaprine (FLEXERIL) 10 MG tablet, Take 1 tablet (10 mg total) by mouth daily as needed., Disp: 30 tablet, Rfl: 5   diclofenac Sodium (VOLTAREN) 1 % GEL, Apply 2 grams to the affected areas 3 times daily, Disp: 100 g, Rfl: 0   ergocalciferol (VITAMIN D2) 1.25 MG (50000 UT) capsule, Take 1 capsule (50,000 Units total) by mouth once a week., Disp: 12 capsule, Rfl: 1   Fluticasone Propionate (XHANCE) 93 MCG/ACT EXHU, Place 1 spray into the nose in the morning and at bedtime., Disp: 48 mL, Rfl: 3   hydrOXYzine (VISTARIL) 50 MG capsule, Take 2 capsules (100 mg total) by mouth at bedtime for sleep, Disp: 180 capsule, Rfl: 1   levocetirizine (XYZAL) 5 MG tablet, Take 1 tablet (5 mg total) by mouth every evening., Disp: 90 tablet, Rfl: 1   lisdexamfetamine (VYVANSE) 40 MG capsule, Take 1 capsule (40 mg total) by mouth every morning., Disp: 90 capsule, Rfl: 0   minoxidil (LONITEN) 2.5 MG tablet, Take 1 tablet (2.5 mg total) by mouth daily., Disp: 90 tablet, Rfl: 0    Multiple Vitamin (MULTIVITAMIN) tablet, Take 1 tablet by mouth daily. , Disp: , Rfl:    olmesartan-hydrochlorothiazide (BENICAR HCT) 20-12.5 MG tablet, Take 1 tablet by mouth daily., Disp: 90 tablet, Rfl: 3   pantoprazole (PROTONIX) 40 MG tablet, Take 1 tablet by mouth daily before breakfast., Disp: 90 tablet, Rfl: 2   polycarbophil (FIBERCON) 625 MG tablet, Take 1,250 mg by mouth daily., Disp: , Rfl:    RESTASIS 0.05 % ophthalmic emulsion, Place 1 drop into both eyes 2 times daily., Disp: 60 each, Rfl: 1   spironolactone (ALDACTONE) 25 MG tablet, Take 0.5 tablets (12.5 mg total) by mouth daily for facial acne., Disp: 45 tablet, Rfl: 3   traZODone (DESYREL) 50 MG tablet, Take 1 tablet (50 mg total) by mouth at bedtime for sleep., Disp: 90 tablet, Rfl: 2   vitamin B-12 (CYANOCOBALAMIN) 1000 MCG tablet, Takes 5000 mcg  once every 3 months, on 6 mcg daily with flinstone, Disp: , Rfl:    Physical Exam:   There were no vitals taken for this visit.   Salient findings:  CN II-XII intact  Bilateral EAC clear and TM intact with well pneumatized middle ear spaces No lesions of oral cavity/oropharynx; dentition with some caries; tonsils 2+, normal in appearance; small stone right mid pole No obviously palpable neck masses/lymphadenopathy/thyromegaly; vague diffuse neck fullness likely thyroid No respiratory distress or stridor  Procedures:  None  Impression & Plans:  Lesbia Ottaway is a 52 y.o. female with:  Multinodular goiter: normal thyroid labs; last [place] 2019, prior Bx dominant Rt nodule benign; no sx - Will obtain repeat US for further eval per patient preference to r/o contribution to dysphagia  2. Tonsil stones: foul taste but no frequent strep infections  or PTAs; not particularly painful or bothersome - we discussed oral care and salt water gargles - not bothersome enough to warrant tonsillectomy - would not ascribe this to her dysphagia  3. Dysphagia: unclear etiology but she does have  GERD; query globus v/s esophageal dysmotility Will get MBS to rule out structural pathology  - f/u 6-8 weeks   Thank you for allowing me the opportunity to care for your patient. Please do not hesitate to contact me should you have any other questions.  Sincerely, Jovita Kussmaul, MD Otolarynoglogist (ENT), [facility] Phone: [phone] Fax: [phone]  05/26/2023, 11:30 AM   I have personally spent 51 minutes involved in face-to-face and non-face-to-face activities for this patient on the day of the visit.  Professional time spent includes the following activities, in addition to those noted in the documentation: preparing to see the patient (review of outside documentation and results - thy [place] and Bx), performing a medically appropriate examination and/or evaluation, counseling and educating the patient/family/caregiver, ordering medications referring and communicating with other healthcare professionals, documenting clinical information in the electronic or other health record, independently interpreting results and communicating results with the patient

## 2023-06-01 ENCOUNTER — Other Ambulatory Visit: Payer: Self-pay

## 2023-06-01 ENCOUNTER — Other Ambulatory Visit (HOSPITAL_COMMUNITY): Payer: Self-pay

## 2023-06-05 ENCOUNTER — Other Ambulatory Visit (HOSPITAL_COMMUNITY)
Admission: RE | Admit: 2023-06-05 | Discharge: 2023-06-05 | Disposition: A | Discharge status: Home / Self Care | Payer: Commercial Managed Care - PPO | Source: Ambulatory Visit | Attending: Oncology | Admitting: Oncology

## 2023-06-05 DIAGNOSIS — Z006 Encounter for examination for normal comparison and control in clinical research program: Secondary | ICD-10-CM | POA: Insufficient documentation

## 2023-06-13 LAB — HELIX MOLECULAR SCREEN: Genetic Analysis Overall Interpretation: NEGATIVE

## 2023-06-13 LAB — GENECONNECT MOLECULAR SCREEN

## 2023-06-18 ENCOUNTER — Other Ambulatory Visit: Payer: Self-pay | Admitting: Family Medicine

## 2023-06-18 DIAGNOSIS — J309 Allergic rhinitis, unspecified: Secondary | ICD-10-CM

## 2023-06-19 ENCOUNTER — Other Ambulatory Visit: Payer: Self-pay

## 2023-06-19 ENCOUNTER — Other Ambulatory Visit (HOSPITAL_COMMUNITY): Payer: Self-pay

## 2023-06-19 MED ORDER — LEVOCETIRIZINE DIHYDROCHLORIDE 5 MG PO TABS
5.0000 mg | ORAL_TABLET | Freq: Every evening | ORAL | 1 refills | Status: DC
  Filled 2023-06-19: qty 90, 90d supply, fill #0

## 2023-06-19 MED ORDER — MINOXIDIL 2.5 MG PO TABS
2.5000 mg | ORAL_TABLET | Freq: Every day | ORAL | 0 refills | Status: DC
  Filled 2023-06-19: qty 90, 90d supply, fill #0

## 2023-06-20 ENCOUNTER — Encounter: Payer: Self-pay | Admitting: Family Medicine

## 2023-07-03 ENCOUNTER — Other Ambulatory Visit (HOSPITAL_COMMUNITY): Payer: Self-pay

## 2023-07-03 ENCOUNTER — Other Ambulatory Visit: Payer: Self-pay

## 2023-08-07 ENCOUNTER — Other Ambulatory Visit: Payer: Self-pay

## 2023-08-07 ENCOUNTER — Other Ambulatory Visit (HOSPITAL_COMMUNITY): Payer: Self-pay

## 2023-08-09 ENCOUNTER — Telehealth (INDEPENDENT_AMBULATORY_CARE_PROVIDER_SITE_OTHER): Payer: Self-pay

## 2023-08-09 ENCOUNTER — Other Ambulatory Visit (HOSPITAL_COMMUNITY): Payer: Self-pay

## 2023-08-09 NOTE — Telephone Encounter (Signed)
 Called patient to se if they had swallow test done and us  thyroid  done. It doesn't show in our system that she has had it done if not she needs to reschedule after these are done.

## 2023-08-14 ENCOUNTER — Ambulatory Visit (INDEPENDENT_AMBULATORY_CARE_PROVIDER_SITE_OTHER): Payer: Commercial Managed Care - PPO

## 2023-08-23 ENCOUNTER — Other Ambulatory Visit: Payer: Self-pay | Admitting: Family Medicine

## 2023-08-23 ENCOUNTER — Other Ambulatory Visit (HOSPITAL_COMMUNITY): Payer: Self-pay

## 2023-08-29 ENCOUNTER — Encounter: Payer: Self-pay | Admitting: Family Medicine

## 2023-08-29 ENCOUNTER — Other Ambulatory Visit: Payer: Self-pay | Admitting: Family Medicine

## 2023-08-29 ENCOUNTER — Other Ambulatory Visit (HOSPITAL_COMMUNITY): Payer: Self-pay

## 2023-08-30 ENCOUNTER — Other Ambulatory Visit: Payer: Self-pay

## 2023-08-30 DIAGNOSIS — J309 Allergic rhinitis, unspecified: Secondary | ICD-10-CM

## 2023-08-30 MED ORDER — HYDROXYZINE PAMOATE 50 MG PO CAPS
100.0000 mg | ORAL_CAPSULE | Freq: Every day | ORAL | 1 refills | Status: DC
  Filled 2023-08-30: qty 180, 90d supply, fill #0
  Filled 2023-12-09: qty 180, 90d supply, fill #1

## 2023-08-30 MED ORDER — OLMESARTAN MEDOXOMIL-HCTZ 20-12.5 MG PO TABS
1.0000 | ORAL_TABLET | Freq: Every day | ORAL | 3 refills | Status: DC
  Filled 2023-08-30 – 2023-10-30 (×2): qty 90, 90d supply, fill #0
  Filled 2024-01-26: qty 90, 90d supply, fill #1
  Filled 2024-04-12: qty 90, 90d supply, fill #2
  Filled 2024-07-15: qty 90, 90d supply, fill #3

## 2023-08-30 MED ORDER — LEVOCETIRIZINE DIHYDROCHLORIDE 5 MG PO TABS
5.0000 mg | ORAL_TABLET | Freq: Every evening | ORAL | 1 refills | Status: DC
  Filled 2023-08-30 – 2023-09-16 (×2): qty 90, 90d supply, fill #0
  Filled 2023-12-15: qty 90, 90d supply, fill #1

## 2023-08-30 MED ORDER — TRAZODONE HCL 50 MG PO TABS
50.0000 mg | ORAL_TABLET | Freq: Every day | ORAL | 2 refills | Status: DC
  Filled 2023-08-30: qty 90, 90d supply, fill #0
  Filled 2023-12-09: qty 90, 90d supply, fill #1

## 2023-08-30 MED ORDER — PANTOPRAZOLE SODIUM 40 MG PO TBEC
40.0000 mg | DELAYED_RELEASE_TABLET | Freq: Every day | ORAL | 2 refills | Status: DC
  Filled 2023-08-30: qty 90, 90d supply, fill #0

## 2023-08-30 MED ORDER — LISDEXAMFETAMINE DIMESYLATE 40 MG PO CAPS
40.0000 mg | ORAL_CAPSULE | ORAL | 0 refills | Status: DC
  Filled 2023-08-30: qty 90, 90d supply, fill #0

## 2023-08-30 MED ORDER — MINOXIDIL 2.5 MG PO TABS
2.5000 mg | ORAL_TABLET | Freq: Every day | ORAL | 0 refills | Status: DC
  Filled 2023-08-30 – 2023-09-16 (×3): qty 90, 90d supply, fill #0

## 2023-08-30 MED ORDER — SPIRONOLACTONE 25 MG PO TABS
12.5000 mg | ORAL_TABLET | Freq: Every day | ORAL | 3 refills | Status: DC
  Filled 2023-08-30: qty 45, 90d supply, fill #0

## 2023-08-31 ENCOUNTER — Other Ambulatory Visit (HOSPITAL_COMMUNITY): Payer: Self-pay

## 2023-09-01 ENCOUNTER — Ambulatory Visit: Payer: Commercial Managed Care - PPO | Admitting: Family Medicine

## 2023-09-04 ENCOUNTER — Other Ambulatory Visit: Payer: Self-pay

## 2023-09-06 DIAGNOSIS — J00 Acute nasopharyngitis [common cold]: Secondary | ICD-10-CM | POA: Diagnosis not present

## 2023-09-12 ENCOUNTER — Other Ambulatory Visit: Payer: Self-pay

## 2023-09-16 ENCOUNTER — Other Ambulatory Visit (HOSPITAL_COMMUNITY): Payer: Self-pay

## 2023-09-18 ENCOUNTER — Other Ambulatory Visit: Payer: Self-pay

## 2023-09-28 ENCOUNTER — Encounter: Payer: Commercial Managed Care - PPO | Admitting: Family Medicine

## 2023-10-02 ENCOUNTER — Ambulatory Visit (INDEPENDENT_AMBULATORY_CARE_PROVIDER_SITE_OTHER)

## 2023-10-02 DIAGNOSIS — Z111 Encounter for screening for respiratory tuberculosis: Secondary | ICD-10-CM

## 2023-10-02 NOTE — Progress Notes (Signed)
 Pt. Came in for TB skin test. 0.57ml was administered in left lower forearm of pt. With appropriate wheel response. Pt. Took to test without any complications . She was informed to return to office at the same time (09:25 am) Wednesday (10/04/23) morning for review she stated she may not be able to come back until Thursday, she was told Thursday morning would be the latest for review for accurate test results and documentation. Pt. Stated she understood.

## 2023-10-04 ENCOUNTER — Encounter: Payer: Self-pay | Admitting: Family Medicine

## 2023-10-04 LAB — TB SKIN TEST
Induration: 0 mm
TB Skin Test: NEGATIVE

## 2023-10-18 ENCOUNTER — Other Ambulatory Visit (HOSPITAL_COMMUNITY): Payer: Self-pay

## 2023-10-18 ENCOUNTER — Encounter: Payer: Self-pay | Admitting: Family Medicine

## 2023-10-18 ENCOUNTER — Other Ambulatory Visit: Payer: Self-pay

## 2023-10-18 ENCOUNTER — Ambulatory Visit (INDEPENDENT_AMBULATORY_CARE_PROVIDER_SITE_OTHER): Payer: Commercial Managed Care - PPO | Admitting: Family Medicine

## 2023-10-18 VITALS — BP 138/86 | HR 90 | Resp 16 | Ht 68.0 in | Wt 213.8 lb

## 2023-10-18 DIAGNOSIS — Z0000 Encounter for general adult medical examination without abnormal findings: Secondary | ICD-10-CM

## 2023-10-18 DIAGNOSIS — F900 Attention-deficit hyperactivity disorder, predominantly inattentive type: Secondary | ICD-10-CM

## 2023-10-18 DIAGNOSIS — Z23 Encounter for immunization: Secondary | ICD-10-CM

## 2023-10-18 MED ORDER — SPIRONOLACTONE 25 MG PO TABS
12.5000 mg | ORAL_TABLET | Freq: Every day | ORAL | 3 refills | Status: AC
  Filled 2023-10-18: qty 20, 40d supply, fill #0
  Filled 2024-04-12 (×2): qty 36, 72d supply, fill #0

## 2023-10-18 MED ORDER — VITAMIN D (ERGOCALCIFEROL) 1.25 MG (50000 UNIT) PO CAPS
50000.0000 [IU] | ORAL_CAPSULE | ORAL | 3 refills | Status: AC
  Filled 2023-10-18 – 2024-03-18 (×2): qty 6, 84d supply, fill #0
  Filled 2024-03-18 – 2024-07-15 (×3): qty 6, 84d supply, fill #1

## 2023-10-18 MED ORDER — CYCLOBENZAPRINE HCL 10 MG PO TABS
10.0000 mg | ORAL_TABLET | Freq: Every day | ORAL | 1 refills | Status: AC | PRN
  Filled 2023-10-18: qty 90, 90d supply, fill #0
  Filled 2024-08-29: qty 90, 90d supply, fill #1

## 2023-10-18 MED ORDER — LISDEXAMFETAMINE DIMESYLATE 60 MG PO CAPS
60.0000 mg | ORAL_CAPSULE | ORAL | 0 refills | Status: DC
  Filled 2023-10-18: qty 90, 90d supply, fill #0

## 2023-10-18 NOTE — Patient Instructions (Addendum)
 Fu In 10 to 11 weeks, call if you need me sooner  Dose increase in Vyvanse as discussed    TdaP today  NURSE PLS CHECK AND DOCUMENT ON FORM AND IN RECORD, TEMPERATURE AND VISION SCREEN  FORM TO BE RETURNED TO PT TODAY PLEASE  NEED PAP , PAST DUE  BEST WITH CLASSES  Thanks for choosing [facility], we consider it a privelige to serve you.

## 2023-10-18 NOTE — Progress Notes (Signed)
     Kristine Lawson     MRN: [id number]      DOB: 03-26-1971  Chief Complaint  Patient presents with   Annual Exam   ADHD    Follow up     HPI: Patient is in for annual physical exam. . Immunization is reviewed , and  updated  States aDHD med not as effective and needs dose increase Needs form completion for general exam for school, currently enrolled in classes.   PE: BP 138/86   Pulse 90   Resp 16   Ht 5\' 8"  (1.727 m)   Wt 213 lb 12.8 oz (97 kg)   SpO2 96%   BMI 32.51 kg/m   Pleasant  female, alert and oriented x 3, in no cardio-pulmonary distress. Afebrile. HEENT No facial trauma or asymetry. Sinuses non tender.  Extra occullar muscles intact.. External ears normal, . Neck: supple, no adenopathy,JVD or thyromegaly.No bruits.  Chest: Clear to ascultation bilaterally.No crackles or wheezes. Non tender to palpation  Cardiovascular system; Heart sounds normal,  S1 and  S2 ,no S3.  No murmur, or thrill. Apical beat not displaced Peripheral pulses normal.  Abdomen: Soft, non tender, GU: Deferred, sees Gyne  Musculoskeletal exam: Decreased though adequate l ROM of spine, hips , shoulders and knees. No deformity ,swelling or crepitus noted. No muscle wasting or atrophy.   Neurologic: Cranial nerves 2 to 12 intact. Power, tone ,sensation normal throughout. No disturbance in gait. No tremor.  Skin: Intact, no ulceration, erythema , scaling or rash noted. Pigmentation normal throughout  Psych; Normal mood and affect. Judgement and concentration normal   Assessment & Plan:  Encounter for annual physical exam Annual exam as documented.  Immunization and cancer screening needs are specifically addressed at this visit.   Encounter for immunization After obtaining informed consent, the  TdAP vaccine is  administered , with no adverse effect noted at the time of administration.   ADHD Inadequately treated , dose increase in Vyvanse

## 2023-10-27 DIAGNOSIS — H5213 Myopia, bilateral: Secondary | ICD-10-CM | POA: Diagnosis not present

## 2023-10-30 ENCOUNTER — Other Ambulatory Visit (HOSPITAL_COMMUNITY): Payer: Self-pay

## 2023-10-30 ENCOUNTER — Encounter: Payer: Self-pay | Admitting: Family Medicine

## 2023-10-30 DIAGNOSIS — Z23 Encounter for immunization: Secondary | ICD-10-CM | POA: Insufficient documentation

## 2023-10-30 DIAGNOSIS — Z0000 Encounter for general adult medical examination without abnormal findings: Secondary | ICD-10-CM | POA: Insufficient documentation

## 2023-10-30 NOTE — Assessment & Plan Note (Signed)
 Annual exam as documented. . Immunization and cancer screening needs are specifically addressed at this visit.

## 2023-10-30 NOTE — Assessment & Plan Note (Signed)
 Inadequately treated , dose increase in Vyvanse

## 2023-10-30 NOTE — Assessment & Plan Note (Signed)
 After obtaining informed consent, the TdAP  vaccine is  administered , with no adverse effect noted at the time of administration.

## 2023-12-09 ENCOUNTER — Other Ambulatory Visit: Payer: Self-pay | Admitting: Family Medicine

## 2023-12-11 ENCOUNTER — Other Ambulatory Visit (HOSPITAL_COMMUNITY): Payer: Self-pay

## 2023-12-11 MED ORDER — MINOXIDIL 2.5 MG PO TABS
2.5000 mg | ORAL_TABLET | Freq: Every day | ORAL | 0 refills | Status: DC
  Filled 2023-12-11: qty 90, 90d supply, fill #0

## 2023-12-15 ENCOUNTER — Other Ambulatory Visit: Payer: Self-pay

## 2023-12-15 ENCOUNTER — Other Ambulatory Visit (HOSPITAL_COMMUNITY): Payer: Self-pay

## 2024-01-10 ENCOUNTER — Other Ambulatory Visit: Payer: Self-pay | Admitting: Family Medicine

## 2024-01-15 ENCOUNTER — Other Ambulatory Visit (HOSPITAL_COMMUNITY): Payer: Self-pay

## 2024-01-15 ENCOUNTER — Encounter: Payer: Self-pay | Admitting: Family Medicine

## 2024-01-15 MED ORDER — LISDEXAMFETAMINE DIMESYLATE 60 MG PO CAPS
60.0000 mg | ORAL_CAPSULE | ORAL | 0 refills | Status: DC
  Filled 2024-01-15: qty 90, 90d supply, fill #0

## 2024-01-27 ENCOUNTER — Other Ambulatory Visit (HOSPITAL_COMMUNITY): Payer: Self-pay

## 2024-01-30 ENCOUNTER — Other Ambulatory Visit (HOSPITAL_COMMUNITY): Payer: Self-pay

## 2024-02-22 ENCOUNTER — Ambulatory Visit: Admitting: Family Medicine

## 2024-02-22 ENCOUNTER — Encounter: Payer: Self-pay | Admitting: Family Medicine

## 2024-02-22 ENCOUNTER — Other Ambulatory Visit (HOSPITAL_BASED_OUTPATIENT_CLINIC_OR_DEPARTMENT_OTHER): Payer: Self-pay

## 2024-02-22 VITALS — BP 136/84 | HR 82 | Resp 18 | Ht 68.0 in | Wt 222.1 lb

## 2024-02-22 DIAGNOSIS — F902 Attention-deficit hyperactivity disorder, combined type: Secondary | ICD-10-CM | POA: Diagnosis not present

## 2024-02-22 DIAGNOSIS — I10 Essential (primary) hypertension: Secondary | ICD-10-CM

## 2024-02-22 DIAGNOSIS — M25561 Pain in right knee: Secondary | ICD-10-CM | POA: Insufficient documentation

## 2024-02-22 DIAGNOSIS — G4700 Insomnia, unspecified: Secondary | ICD-10-CM

## 2024-02-22 DIAGNOSIS — E785 Hyperlipidemia, unspecified: Secondary | ICD-10-CM

## 2024-02-22 DIAGNOSIS — E559 Vitamin D deficiency, unspecified: Secondary | ICD-10-CM | POA: Diagnosis not present

## 2024-02-22 DIAGNOSIS — J309 Allergic rhinitis, unspecified: Secondary | ICD-10-CM | POA: Diagnosis not present

## 2024-02-22 DIAGNOSIS — E66811 Obesity, class 1: Secondary | ICD-10-CM | POA: Diagnosis not present

## 2024-02-22 DIAGNOSIS — Z658 Other specified problems related to psychosocial circumstances: Secondary | ICD-10-CM

## 2024-02-22 MED ORDER — LISDEXAMFETAMINE DIMESYLATE 60 MG PO CAPS: 60.0000 mg | ORAL_CAPSULE | ORAL | 0 refills | Status: DC

## 2024-02-22 MED ORDER — CLOBETASOL PROPIONATE 0.05 % EX SOLN
1.0000 | Freq: Two times a day (BID) | CUTANEOUS | 1 refills | Status: AC
  Filled 2024-02-22: qty 50, 25d supply, fill #0

## 2024-02-22 MED ORDER — PREDNISONE 20 MG PO TABS: 20.0000 mg | ORAL_TABLET | Freq: Two times a day (BID) | ORAL | 0 refills | Status: DC

## 2024-02-22 MED ORDER — PANTOPRAZOLE SODIUM 40 MG PO TBEC
40.0000 mg | DELAYED_RELEASE_TABLET | Freq: Every day | ORAL | 2 refills | Status: AC
  Filled 2024-02-22: qty 90, 90d supply, fill #0

## 2024-02-22 NOTE — Assessment & Plan Note (Signed)
 Ongoing challenge but coping well

## 2024-02-22 NOTE — Assessment & Plan Note (Signed)
  Patient re-educated about  the importance of commitment to a  minimum of 150 minutes of exercise per week as able.  The importance of healthy food choices with portion control discussed, as well as eating regularly and within a 12 hour window most days. The need to choose clean , green food 50 to 75% of the time is discussed, as well as to make water  the primary drink and set a goal of 64 ounces water  daily.       02/22/2024    8:05 AM 10/18/2023    8:26 AM 05/26/2023   12:24 PM  Weight /BMI  Weight 222 lb 1.3 oz 213 lb 12.8 oz 211 lb  Height 5' 8 (1.727 m) 5' 8 (1.727 m) 5' 8 (1.727 m)  BMI 33.77 kg/m2 32.51 kg/m2 32.08 kg/m2    Needs to reduc e/ stop high calorie drink, continue great exercise

## 2024-02-22 NOTE — Assessment & Plan Note (Signed)
 Controlled, no change in medication

## 2024-02-22 NOTE — Assessment & Plan Note (Signed)
 Controlled, no change in medication DASH diet and commitment to daily physical activity for a minimum of 30 minutes discussed and encouraged, as a part of hypertension management. The importance of attaining a healthy weight is also discussed.     02/22/2024    8:05 AM 10/18/2023    9:02 AM 10/18/2023    8:26 AM 05/26/2023   12:24 PM 05/25/2023    8:41 AM 03/30/2023    8:09 AM 12/01/2022    4:39 PM  BP/Weight  Systolic BP 136 138 147  116 128 140  Diastolic BP 84 86 91  76 81 90  Wt. (Lbs) 222.08  213.8 211 215.08 224.04   BMI 33.77 kg/m2  32.51 kg/m2 32.08 kg/m2 32.7 kg/m2 34.07 kg/m2

## 2024-02-22 NOTE — Progress Notes (Signed)
 Kristine Lawson     MRN: [id number]      DOB: 05-07-1971  Chief Complaint  Patient presents with   Hypertension    Follow up    Knee Pain    Complains of right knee pain and swelling x3 weeks     HPI Kristine Lawson is here for follow up and re-evaluation of chronic medical conditions, medication management and review of any available recent lab and radiology data.  Preventive health is updated, specifically  Cancer screening and Immunization.   Questions or concerns regarding consultations or procedures which the PT has had in the interim are  addressed. The PT denies any adverse reactions to current medications since the last visit.  Complaint as above C/o poor sleep, tossing and turning all night  ROS Denies recent fever or chills. Denies sinus pressure, nasal congestion, ear pain or sore throat. Denies chest congestion, productive cough or wheezing. Denies chest pains, palpitations and leg swelling Denies abdominal pain, nausea, vomiting,diarrhea or constipation.   Denies dysuria, frequency, hesitancy or incontinence. Denies headaches, seizures, numbness, or tingling. Increased stress with her son in legal problems, doing the best that she can with this, leaning on spirituality Denies skin break down or rash.   PE  BP 136/84   Pulse 82   Resp 18   Ht 5' 8 (1.727 m)   Wt 222 lb 1.3 oz (100.7 kg)   SpO2 98%   BMI 33.77 kg/m   Patient alert and oriented and in no cardiopulmonary distress.  HEENT: No facial asymmetry, EOMI,     Neck supple .  Chest: Clear to auscultation bilaterally.  CVS: S1, S2 no murmurs, no S3.Regular rate.  ABD: Soft non tender.   Ext: No edema  MS: Adequate ROM spine, shoulders, hips and reduced slightly in right  knee due to pain.  Skin: Intact, no ulcerations or rash noted.  Psych: Good eye contact, normal affect. Memory intact not anxious or depressed appearing.  CNS: CN 2-12 intact, power,  normal throughout.no focal deficits  noted.   Assessment & Plan  HYPERTENSION, BENIGN ESSENTIAL Controlled, no change in medication DASH diet and commitment to daily physical activity for a minimum of 30 minutes discussed and encouraged, as a part of hypertension management. The importance of attaining a healthy weight is also discussed.     02/22/2024    8:05 AM 10/18/2023    9:02 AM 10/18/2023    8:26 AM 05/26/2023   12:24 PM 05/25/2023    8:41 AM 03/30/2023    8:09 AM 12/01/2022    4:39 PM  BP/Weight  Systolic BP 136 138 147  116 128 140  Diastolic BP 84 86 91  76 81 90  Wt. (Lbs) 222.08  213.8 211 215.08 224.04   BMI 33.77 kg/m2  32.51 kg/m2 32.08 kg/m2 32.7 kg/m2 34.07 kg/m2        Insomnia Sleep hygiene reviewed  Prescription sent for  medication needed.Recommend increasing trazodone  to 100 mg daily to see if sleep improves   Obesity (BMI 30.0-34.9)  Patient re-educated about  the importance of commitment to a  minimum of 150 minutes of exercise per week as able.  The importance of healthy food choices with portion control discussed, as well as eating regularly and within a 12 hour window most days. The need to choose clean , green food 50 to 75% of the time is discussed, as well as to make water  the primary drink and set a goal of 64  ounces water  daily.       02/22/2024    8:05 AM 10/18/2023    8:26 AM 05/26/2023   12:24 PM  Weight /BMI  Weight 222 lb 1.3 oz 213 lb 12.8 oz 211 lb  Height 5' 8 (1.727 m) 5' 8 (1.727 m) 5' 8 (1.727 m)  BMI 33.77 kg/m2 32.51 kg/m2 32.08 kg/m2    Needs to reduc e/ stop high calorie drink, continue great exercise  Psychosocial stressors Ongoing challenge but coping well  Acute pain of right knee 2 week history, no specific inciting trauma, prednisone  x 5 days , ortho fopr intra articular if no improvement  Allergic rhinitis Controlled, no change in medication   ADHD Controlled, no change in medication

## 2024-02-22 NOTE — Assessment & Plan Note (Signed)
 Sleep hygiene reviewed  Prescription sent for  medication needed.Recommend increasing trazodone  to 100 mg daily to see if sleep improves

## 2024-02-22 NOTE — Patient Instructions (Signed)
 F/U in 4 months, call if you need me sooner  Please schedule your mammogram  Please stop the calorie laden coffee!!.Clint for strength!   Fasting CBC, lipid, cmp and EGFr, tSH and vit D 3 to 7 days before next appointment   Prednisone  is prescribed for 5 days for right knee pain  NEED pap smear!!!  Pls send Hep B vaccine record  It is important that you exercise regularly at least 30 minutes 5 times a week. If you develop chest pain, have severe difficulty breathing, or feel very tired, stop exercising immediately and seek medical attention   INCREASE TRAZODONE  to 50 mg TWO at night for better sleep, let me know in next 3 weeks  if effective I will increase your dose to 100 mg at bedtime  Thanks for choosing [facility], we consider it a privelige to serve you.

## 2024-02-22 NOTE — Assessment & Plan Note (Addendum)
 2 week history, no specific inciting trauma, prednisone  x 5 days , ortho fopr intra articular if no improvement

## 2024-03-18 ENCOUNTER — Other Ambulatory Visit (HOSPITAL_BASED_OUTPATIENT_CLINIC_OR_DEPARTMENT_OTHER): Payer: Self-pay

## 2024-03-18 ENCOUNTER — Other Ambulatory Visit: Payer: Self-pay | Admitting: Family Medicine

## 2024-03-18 ENCOUNTER — Other Ambulatory Visit: Payer: Self-pay

## 2024-03-18 ENCOUNTER — Encounter: Payer: Self-pay | Admitting: Family Medicine

## 2024-03-18 DIAGNOSIS — J309 Allergic rhinitis, unspecified: Secondary | ICD-10-CM

## 2024-03-18 MED ORDER — TRAZODONE HCL 100 MG PO TABS
100.0000 mg | ORAL_TABLET | Freq: Every day | ORAL | 3 refills | Status: AC
  Filled 2024-03-18: qty 90, 90d supply, fill #0
  Filled 2024-06-17: qty 90, 90d supply, fill #1

## 2024-03-18 MED ORDER — MINOXIDIL 2.5 MG PO TABS
2.5000 mg | ORAL_TABLET | Freq: Every day | ORAL | 0 refills | Status: DC
  Filled 2024-03-18: qty 90, 90d supply, fill #0

## 2024-03-18 MED ORDER — HYDROXYZINE PAMOATE 50 MG PO CAPS
100.0000 mg | ORAL_CAPSULE | Freq: Every day | ORAL | 1 refills | Status: AC
  Filled 2024-03-18: qty 180, 90d supply, fill #0
  Filled 2024-06-17: qty 180, 90d supply, fill #1

## 2024-03-18 MED ORDER — LEVOCETIRIZINE DIHYDROCHLORIDE 5 MG PO TABS
5.0000 mg | ORAL_TABLET | Freq: Every evening | ORAL | 1 refills | Status: AC
  Filled 2024-03-18: qty 90, 90d supply, fill #0
  Filled 2024-06-17: qty 90, 90d supply, fill #1

## 2024-03-19 ENCOUNTER — Other Ambulatory Visit (HOSPITAL_COMMUNITY): Payer: Self-pay

## 2024-03-19 ENCOUNTER — Other Ambulatory Visit (HOSPITAL_BASED_OUTPATIENT_CLINIC_OR_DEPARTMENT_OTHER): Payer: Self-pay

## 2024-03-21 ENCOUNTER — Other Ambulatory Visit: Payer: Self-pay | Admitting: Family Medicine

## 2024-03-21 ENCOUNTER — Other Ambulatory Visit (HOSPITAL_BASED_OUTPATIENT_CLINIC_OR_DEPARTMENT_OTHER): Payer: Self-pay

## 2024-03-21 MED ORDER — ACYCLOVIR 400 MG PO TABS
400.0000 mg | ORAL_TABLET | Freq: Every day | ORAL | 3 refills | Status: AC
  Filled 2024-03-21: qty 90, 90d supply, fill #0
  Filled 2024-06-17: qty 90, 90d supply, fill #1

## 2024-03-28 ENCOUNTER — Ambulatory Visit: Payer: Self-pay

## 2024-03-28 ENCOUNTER — Encounter

## 2024-03-28 NOTE — Telephone Encounter (Signed)
 Appt made.

## 2024-03-28 NOTE — Telephone Encounter (Signed)
 FYI Only or Action Required?: FYI only for provider.  Patient was last seen in primary care on 02/22/2024 by Antonetta Rollene BRAVO, MD.  Called Nurse Triage reporting Flank Pain.  Symptoms began several days ago.  Interventions attempted: OTC medications: Tylenol .  Symptoms are: gradually worsening.  Triage Disposition: See Physician Within 24 Hours  Patient/caregiver understands and will follow disposition?: Yes      Copied from CRM [id number]. Topic: Clinical - Red Word Triage >> Mar 28, 2024 12:31 PM Debby BROCKS wrote: Red Word that prompted transfer to Nurse Triage: Back and Side pain that she believes related to kidney stones  Monday the pain started Reason for Disposition  MODERATE pain (e.g., interferes with normal activities or awakens from sleep)  Answer Assessment - Initial Assessment Questions Patient states the pain started like a nerve pain on her  L side now radiating to her back. Pt states she has a hx of kidney stones and pain feels similar but not as intense. Pt states she has taken extra strength tylenol  for pain.    1. LOCATION: Where does it hurt? (e.g., left, right)     L side  2. ONSET: When did the pain start?     Monday  3. SEVERITY: How bad is the pain? (e.g., Scale 1-10; mild, moderate, or severe)     6/7 4. PATTERN: Does the pain come and go, or is it constant?      Constant pain  5. CAUSE: What do you think is causing the pain?     Kidney stone  6. OTHER SYMPTOMS:  Do you have any other symptoms? (e.g., fever, abdomen pain, vomiting, leg weakness, burning with urination, blood in urine)     Chills, low grade fever (99.2 temp), and nausea  Protocols used: Flank Pain-A-AH

## 2024-03-29 ENCOUNTER — Other Ambulatory Visit (HOSPITAL_BASED_OUTPATIENT_CLINIC_OR_DEPARTMENT_OTHER): Payer: Self-pay

## 2024-03-29 ENCOUNTER — Encounter: Payer: Self-pay | Admitting: Family Medicine

## 2024-03-29 ENCOUNTER — Ambulatory Visit: Admitting: Family Medicine

## 2024-03-29 VITALS — BP 120/72 | HR 101 | Temp 98.0°F | Ht 68.0 in | Wt 220.8 lb

## 2024-03-29 DIAGNOSIS — R109 Unspecified abdominal pain: Secondary | ICD-10-CM | POA: Diagnosis not present

## 2024-03-29 LAB — URINALYSIS, ROUTINE W REFLEX MICROSCOPIC
Bilirubin Urine: NEGATIVE
Glucose, UA: NEGATIVE
Hgb urine dipstick: NEGATIVE
Hyaline Cast: NONE SEEN /LPF
Ketones, ur: NEGATIVE
Nitrite: NEGATIVE
Protein, ur: NEGATIVE
RBC / HPF: NONE SEEN /HPF (ref 0–2)
Specific Gravity, Urine: 1.02 (ref 1.001–1.035)
pH: 7 (ref 5.0–8.0)

## 2024-03-29 LAB — MICROSCOPIC MESSAGE

## 2024-03-29 MED ORDER — SULFAMETHOXAZOLE-TRIMETHOPRIM 800-160 MG PO TABS
1.0000 | ORAL_TABLET | Freq: Two times a day (BID) | ORAL | 0 refills | Status: DC
  Filled 2024-03-29: qty 14, 7d supply, fill #0

## 2024-03-29 NOTE — Progress Notes (Signed)
 Subjective:    Patient ID: Kristine Lawson, female    DOB: 09/03/1970, 53 y.o.   MRN: [id number]  Flank Pain    Patient is working urgently due to pain in her left flank.  Pain started on Monday after lifting weight.  The pain is constant.  She denies any pain with movement.  She denies any radiation of the pain to her lower abdomen.  She does have some left-sided CVA tenderness.  She does report increased urinary frequency and urgency.  She denies any hematuria.  She does report a low-grade fever.  Urinalysis today shows +2 leukocyte esterase but negative blood and negative nitrates. Past Medical History:  Diagnosis Date   Allergy    Anemia    IDA   Arthritis    Arthritis    Family history of breast cancer    Family history of cancer of gallbladder    Family history of cervical cancer    Family history of lung cancer    Family history of prostate cancer    Family history of throat cancer    GERD (gastroesophageal reflux disease)    History of stomach ulcers    Hypertension    Insomnia    meds needed daily to achieve 6+ hours sleep.   Trapezius strain 11/13/2019   Ureteral stone with hydronephrosis 05/09/2020   Past Surgical History:  Procedure Laterality Date   ABLATION     uterine ablation   APPENDECTOMY     BIOPSY  12/18/2019   Procedure: BIOPSY;  Surgeon: Golda Claudis PENNER, MD;  [facility];  Service: Endoscopy;;  gastric   BREATH TEK H PYLORI N/A 11/18/2014   Procedure: BREATH TEK VEAR LORA;  Surgeon: Camellia Blush, MD;  Location: [facility];  Service: General;  Laterality: N/A;   CHOLECYSTECTOMY     COLONOSCOPY N/A 12/18/2019   Procedure: COLONOSCOPY;  Surgeon: Golda Claudis PENNER, MD;  [facility];  Service: Endoscopy;  Laterality: N/A;  930   CYSTOSCOPY W/ RETROGRADES Right 05/07/2020   Procedure: CYSTOSCOPY WITH RIGHT RETROGRADE PYELOGRAM;  Surgeon: Sherrilee Belvie CROME, MD;  [facility]: [facility];  Service: Urology;  Laterality: Right;   CYSTOSCOPY  W/ URETERAL STENT PLACEMENT Right 05/07/2020   Procedure: CYSTOSCOPY WITH RIGHT URETERAL STENT REPLACEMENT;  Surgeon: Sherrilee Belvie CROME, MD;  Location: [facility];  Service: Urology;  Laterality: Right;   ESOPHAGOGASTRODUODENOSCOPY N/A 10/01/2018   Procedure: ESOPHAGOGASTRODUODENOSCOPY (EGD);  Surgeon: Golda Claudis PENNER, MD;  [facility];  Service: Endoscopy;  Laterality: N/A;   ESOPHAGOGASTRODUODENOSCOPY N/A 12/18/2019   Procedure: ESOPHAGOGASTRODUODENOSCOPY (EGD);  Surgeon: Golda Claudis PENNER, MD;  [facility];  Service: Endoscopy;  Laterality: N/A;   EXTRACORPOREAL SHOCK WAVE LITHOTRIPSY Right 05/12/2020   Procedure: EXTRACORPOREAL SHOCK WAVE LITHOTRIPSY (ESWL);  Surgeon: Sherrilee Belvie CROME, MD;  [facility]: [facility];  Service: Urology;  Laterality: Right;   HERNIA REPAIR N/A    Phreesia 11/10/2020   LAPAROSCOPIC ROUX-EN-Y GASTRIC BYPASS WITH HIATAL HERNIA REPAIR N/A 02/10/2015   Procedure: LAPAROSCOPIC ROUX-EN-Y GASTRIC BYPASS ;  Surgeon: Camellia Blush, MD;  Location: [facility];  Service: General;  Laterality: N/A;   TUBAL LIGATION     UPPER GI ENDOSCOPY N/A 02/10/2015   Procedure: UPPER GI ENDOSCOPY;  Surgeon: Camellia Blush, MD;  [facility]: [facility];  Service: General;  Laterality: N/A;   Current Outpatient Medications on File Prior to Visit  Medication Sig Dispense Refill   acyclovir  (ZOVIRAX ) 400 MG tablet Take 1 tablet (400 mg  total) by mouth daily. 90 tablet 3   Calcium Citrate (CITRACAL PO) Take 500 mg by mouth daily.      clobetasol  (TEMOVATE ) 0.05 % external solution Apply topically 2 times daily. 50 mL 1   cyclobenzaprine  (FLEXERIL ) 10 MG tablet Take 1 tablet (10 mg total) by mouth daily as needed. 90 tablet 1   diclofenac  Sodium (VOLTAREN ) 1 % GEL Apply 2 grams to the affected areas 3 times daily 100 g 0   Fluticasone  Propionate (XHANCE ) 93 MCG/ACT EXHU Place 1 spray into the nose in the morning and at bedtime. 48 mL 3   hydrOXYzine  (VISTARIL ) 50 MG capsule Take 2 capsules (100  mg total) by mouth at bedtime for sleep 180 capsule 1   levocetirizine (XYZAL ) 5 MG tablet Take 1 tablet (5 mg total) by mouth every evening. 90 tablet 1   lisdexamfetamine (VYVANSE ) 60 MG capsule Take 1 capsule (60 mg total) by mouth every morning. 90 capsule 0   [START ON 04/12/2024] lisdexamfetamine (VYVANSE ) 60 MG capsule Take 1 capsule (60 mg total) by mouth every morning. 90 capsule 0   minoxidil  (LONITEN ) 2.5 MG tablet Take 1 tablet (2.5 mg total) by mouth daily. 90 tablet 0   Multiple Vitamin (MULTIVITAMIN) tablet Take 1 tablet by mouth daily.      olmesartan -hydrochlorothiazide  (BENICAR  HCT) 20-12.5 MG tablet Take 1 tablet by mouth daily. 90 tablet 3   pantoprazole  (PROTONIX ) 40 MG tablet Take 1 tablet by mouth daily before breakfast. 90 tablet 2   predniSONE  (DELTASONE ) 20 MG tablet Take 1 tablet (20 mg total) by mouth 2 (two) times daily with a meal. 10 tablet 0   RESTASIS  0.05 % ophthalmic emulsion Place 1 drop into both eyes 2 times daily. 60 each 1   spironolactone  (ALDACTONE ) 25 MG tablet Take 0.5 tablets (12.5 mg total) by mouth 3 days per week 20 tablet 3   traZODone  (DESYREL ) 100 MG tablet Take 1 tablet (100 mg total) by mouth at bedtime. 90 tablet 3   vitamin B-12 (CYANOCOBALAMIN) 1000 MCG tablet Takes 5000 mcg  once every 3 months, on 6 mcg daily with flinstone     Vitamin D , Ergocalciferol , (DRISDOL ) 1.25 MG (50000 UNIT) CAPS capsule Take 1 capsule (50,000 Units total) by mouth every 2 weeks. 6 capsule 3   No current facility-administered medications on file prior to visit.   Allergies  Allergen Reactions   Lisinopril Cough   Triamterene  Other (See Comments)    Cramps    Social History   Socioeconomic History   Marital status: Married    Spouse name: [name]   Number of children: 3   Years of education: Not on file   Highest education level: Associate degree: academic program  Occupational History   Occupation: CARDIOLOGY    Employer: [employer]   Occupation: RN  Tobacco Use   Smoking status: Former    Current packs/day: 0.00    Average packs/day: 0.3 packs/day for 3.0 years (0.8 ttl pk-yrs)    Types: Cigarettes    Start date: 02/05/2004    Quit date: 02/05/2007    Years since quitting: 17.1   Smokeless tobacco: Never  Vaping Use   Vaping status: Never Used  Substance and Sexual Activity   Alcohol use: Yes    Comment: occassional wine 2-3 times a year   Drug use: No   Sexual activity: Yes    Birth control/protection: Surgical  Other Topics Concern   Not on file  Social History Narrative   Lives [name] husband-10 years    4 kids blended together   3 biologically hers.      No pets in the home   Enjoy: playing tennis, watching games on tv , hgtv      Diet: well balanced, eats a lot of protein, and veggies, tries to avoid sweets and carbs   Caffeine: 2-3 cups daily   Water : 2.5 16 oz bottles      Wear seat beat    Doesn't wear sunscreen    Smoke and carbon monoxide detectors    Uses speaker phone while driving, avoids texting.    Social Drivers of Corporate investment banker Strain: Low Risk  (02/21/2024)   Overall Financial Resource Strain (CARDIA)    Difficulty of Paying Living Expenses: Not hard at all  Food Insecurity: No Food Insecurity (02/21/2024)   Hunger Vital Sign    Worried About Running Out of Food in the Last Year: Never true    Ran Out of Food in the Last Year: Never true  Transportation Needs: No Transportation Needs (02/21/2024)   PRAPARE - Administrator, Civil Service (Medical): No    Lack of Transportation (Non-Medical): No  Physical Activity: Sufficiently Active (02/21/2024)   Exercise Vital Sign    Days of Exercise per Week: 5 days    Minutes of Exercise per Session: 40 min  Stress: No Stress Concern Present (02/21/2024)   [employer] of Occupational Health - Occupational Stress Questionnaire    Feeling of Stress: Not at all  Social Connections: Socially  Integrated (02/21/2024)   Social Connection and Isolation Panel    Frequency of Communication with Friends and Family: More than three times a week    Frequency of Social Gatherings with Friends and Family: Once a week    Attends Religious Services: More than 4 times per year    Active Member of [employer] or Organizations: Yes    Attends Banker Meetings: More than 4 times per year    Marital Status: Married  Catering manager Violence: Not At Risk (01/10/2019)   Humiliation, Afraid, Rape, and Kick questionnaire    Fear of Current or Ex-Partner: No    Emotionally Abused: No    Physically Abused: No    Sexually Abused: No     Review of Systems  Genitourinary:  Positive for flank pain.  All other systems reviewed and are negative.      Objective:   Physical Exam Constitutional:      Appearance: Normal appearance. She is normal weight.  Cardiovascular:     Rate and Rhythm: Normal rate and regular rhythm.     Heart sounds: Normal heart sounds.  Pulmonary:     Effort: Pulmonary effort is normal.     Breath sounds: Normal breath sounds.  Abdominal:     General: Abdomen is flat. Bowel sounds are normal. There is no distension.     Tenderness: There is no abdominal tenderness. There is left CVA tenderness. There is no right CVA tenderness or guarding.  Neurological:     Mental Status: She is alert.           Assessment & Plan:   Flank pain - Plan: Urinalysis, Routine w reflex microscopic I believe the patient likely pulled a muscle in her lower back but I think she may also have a urinary tract infection.  Start Bactrim  double strength tablets twice daily for 1 week

## 2024-04-01 ENCOUNTER — Other Ambulatory Visit: Payer: Self-pay | Admitting: Internal Medicine

## 2024-04-01 ENCOUNTER — Other Ambulatory Visit (HOSPITAL_BASED_OUTPATIENT_CLINIC_OR_DEPARTMENT_OTHER): Payer: Self-pay

## 2024-04-01 ENCOUNTER — Encounter: Payer: Self-pay | Admitting: Family Medicine

## 2024-04-01 DIAGNOSIS — F902 Attention-deficit hyperactivity disorder, combined type: Secondary | ICD-10-CM

## 2024-04-01 MED ORDER — LISDEXAMFETAMINE DIMESYLATE 60 MG PO CAPS
60.0000 mg | ORAL_CAPSULE | ORAL | 0 refills | Status: DC
  Filled 2024-04-12: qty 90, 90d supply, fill #0

## 2024-04-02 ENCOUNTER — Encounter: Payer: Self-pay | Admitting: Nurse Practitioner

## 2024-04-02 ENCOUNTER — Ambulatory Visit: Admitting: Nurse Practitioner

## 2024-04-02 VITALS — BP 129/84 | HR 76 | Resp 18

## 2024-04-02 DIAGNOSIS — R1032 Left lower quadrant pain: Secondary | ICD-10-CM

## 2024-04-02 DIAGNOSIS — R300 Dysuria: Secondary | ICD-10-CM | POA: Insufficient documentation

## 2024-04-02 DIAGNOSIS — N200 Calculus of kidney: Secondary | ICD-10-CM | POA: Diagnosis not present

## 2024-04-02 NOTE — Patient Instructions (Addendum)
 1) UA for C & S - UA today neg for leuks, nitrites, blood 2) Cont bactrim  3) Left lower back pain - CT abd/pelvis with contrast ordered, hx of nephrolithiasis 4) Will contact patient with results of C & S and CT of abd/pelvis

## 2024-04-02 NOTE — Progress Notes (Signed)
 Established Patient Office Visit  Subjective:  Patient ID: Kristine Lawson, female    DOB: [birth year]  Age: 53 y.o. MRN: [id number]  Chief Complaint  Patient presents with   Flank Pain    Left flank pain x1 week after picking up laundry basket. Was seen at [facility] on Friday suspected UTI, given bactrim  but no better. Having chills, low grade fever, nausea     Patient here today after recently seeing another provider reporting frequency, urgency upon urination, currently on Cipro .  Only UA was done at that visit.  Patient agreeable to having another UA done today along with sending for C & S.    Flank Pain    No other concerns at this time.   Past Medical History:  Diagnosis Date   Allergy    Anemia    IDA   Arthritis    Arthritis    Family history of breast cancer    Family history of cancer of gallbladder    Family history of cervical cancer    Family history of lung cancer    Family history of prostate cancer    Family history of throat cancer    GERD (gastroesophageal reflux disease)    History of stomach ulcers    Hypertension    Insomnia    meds needed daily to achieve 6+ hours sleep.   Trapezius strain 11/13/2019   Ureteral stone with hydronephrosis 05/09/2020    Past Surgical History:  Procedure Laterality Date   ABLATION     uterine ablation   APPENDECTOMY     BIOPSY  12/18/2019   Procedure: BIOPSY;  Surgeon: Golda Claudis PENNER, MD;  [facility];  Service: Endoscopy;;  gastric   BREATH TEK H PYLORI N/A 11/18/2014   Procedure: BREATH TEK VEAR LORA;  Surgeon: Camellia Blush, MD;  Location: [facility];  Service: General;  Laterality: N/A;   CHOLECYSTECTOMY     COLONOSCOPY N/A 12/18/2019   Procedure: COLONOSCOPY;  Surgeon: Golda Claudis PENNER, MD;  [facility];  Service: Endoscopy;  Laterality: N/A;  930   CYSTOSCOPY W/ RETROGRADES Right 05/07/2020   Procedure: CYSTOSCOPY WITH RIGHT RETROGRADE PYELOGRAM;  Surgeon: Sherrilee Belvie CROME, MD;   [facility];  Service: Urology;  Laterality: Right;   CYSTOSCOPY W/ URETERAL STENT PLACEMENT Right 05/07/2020   Procedure: CYSTOSCOPY WITH RIGHT URETERAL STENT REPLACEMENT;  Surgeon: Sherrilee Belvie CROME, MD;  [facility];  Service: Urology;  Laterality: Right;   ESOPHAGOGASTRODUODENOSCOPY N/A 10/01/2018   Procedure: ESOPHAGOGASTRODUODENOSCOPY (EGD);  Surgeon: Golda Claudis PENNER, MD;  [facility];  Service: Endoscopy;  Laterality: N/A;   ESOPHAGOGASTRODUODENOSCOPY N/A 12/18/2019   Procedure: ESOPHAGOGASTRODUODENOSCOPY (EGD);  Surgeon: Golda Claudis PENNER, MD;  [facility];  Service: Endoscopy;  Laterality: N/A;   EXTRACORPOREAL SHOCK WAVE LITHOTRIPSY Right 05/12/2020   Procedure: EXTRACORPOREAL SHOCK WAVE LITHOTRIPSY (ESWL);  Surgeon: Sherrilee Belvie CROME, MD;  Location[facility];  Service: Urology;  Laterality: Right;   HERNIA REPAIR N/A    Phreesia 11/10/2020   LAPAROSCOPIC ROUX-EN-Y GASTRIC BYPASS WITH HIATAL HERNIA REPAIR N/A 02/10/2015   Procedure: LAPAROSCOPIC ROUX-EN-Y GASTRIC BYPASS ;  Surgeon: Camellia Blush, MD;  [facility];  Service: General;  Laterality: N/A;   TUBAL LIGATION     UPPER GI ENDOSCOPY N/A 02/10/2015   Procedure: UPPER GI ENDOSCOPY;  Surgeon: Camellia Blush, MD;  [facility];  Service: General;  Laterality: N/A;    Social History   Socioeconomic History  Marital status: Married    Spouse name: [name]   Number of children: 3   Years of education: Not on file   Highest education level: Associate degree: academic program  Occupational History   Occupation: CARDIOLOGY    Employer: [employer]   Occupation: RN  Tobacco Use   Smoking status: Former    Current packs/day: 0.00    Average packs/day: 0.3 packs/day for 3.0 years (0.8 ttl pk-yrs)    Types: Cigarettes    Start date: 02/05/2004    Quit date: 02/05/2007    Years since quitting: 17.1   Smokeless tobacco: Never  Vaping Use   Vaping status: Never Used   Substance and Sexual Activity   Alcohol use: Yes    Comment: occassional wine 2-3 times a year   Drug use: No   Sexual activity: Yes    Birth control/protection: Surgical  Other Topics Concern   Not on file  Social History Narrative   Lives [name] husband-10 years    4 kids blended together   3 biologically hers.      No pets in the home   Enjoy: playing tennis, watching games on tv , hgtv      Diet: well balanced, eats a lot of protein, and veggies, tries to avoid sweets and carbs   Caffeine: 2-3 cups daily   Water : 2.5 16 oz bottles      Wear seat beat    Doesn't wear sunscreen    Smoke and carbon monoxide detectors    Uses speaker phone while driving, avoids texting.    Social Drivers of Corporate investment banker Strain: Low Risk  (02/21/2024)   Overall Financial Resource Strain (CARDIA)    Difficulty of Paying Living Expenses: Not hard at all  Food Insecurity: No Food Insecurity (02/21/2024)   Hunger Vital Sign    Worried About Running Out of Food in the Last Year: Never true    Ran Out of Food in the Last Year: Never true  Transportation Needs: No Transportation Needs (02/21/2024)   PRAPARE - Administrator, Civil Service (Medical): No    Lack of Transportation (Non-Medical): No  Physical Activity: Sufficiently Active (02/21/2024)   Exercise Vital Sign    Days of Exercise per Week: 5 days    Minutes of Exercise per Session: 40 min  Stress: No Stress Concern Present (02/21/2024)   [employer] of Occupational Health - Occupational Stress Questionnaire    Feeling of Stress: Not at all  Social Connections: Socially Integrated (02/21/2024)   Social Connection and Isolation Panel    Frequency of Communication with Friends and Family: More than three times a week    Frequency of Social Gatherings with Friends and Family: Once a week    Attends Religious Services: More than 4 times per year    Active Member of [employer] or Organizations: Yes    Attends Probation officer: More than 4 times per year    Marital Status: Married  Catering manager Violence: Not At Risk (01/10/2019)   Humiliation, Afraid, Rape, and Kick questionnaire    Fear of Current or Ex-Partner: No    Emotionally Abused: No    Physically Abused: No    Sexually Abused: No    Family History  Problem Relation Age of Onset   Hypertension Mother    Diabetes Mother    Kidney disease Mother    Hyperlipidemia Mother    Colon cancer Mother  62       highly suspious of colon mass   Breast cancer Mother 4   Cancer Mother    Kidney disease Father    Stroke Father    Heart disease Father    Hyperlipidemia Father    Heart attack Father    Diverticulitis Father    Hypertension Brother    Diabetes Brother    Healthy Sister    Other Daughter        hypertension   Kidney disease Daughter    Blindness Daughter    Healthy Daughter    Healthy Son    Cancer Maternal Aunt 66       gallbladder cancer   Breast cancer Paternal Aunt        dx. in her 53s   Cervical cancer Maternal Grandmother        dx. in her 30s   Prostate cancer Maternal Grandfather        metastatic   Heart attack Paternal Grandmother    Hypertension Paternal Grandmother    Stroke Paternal Grandmother    Lung cancer Paternal Grandfather    Throat cancer Paternal Grandfather    Breast cancer Paternal Aunt        dx. in her 6s   Breast cancer Paternal Aunt        dx. in her 64s   Throat cancer Paternal Uncle    Lung cancer Paternal Uncle    Breast cancer Other        dx. 50s or 60s (mother's first cousins x3)   SIDS Maternal Aunt    Prostate cancer Maternal Uncle        dx. in his 25s, not metastatic   Prostate cancer Maternal Uncle        dx. in his 87s, not metastatic   Breast cancer Cousin        dx. late 35s (paternal cousin)   Breast cancer Other        dx. 40s/early 37s (maternal second cousins x2)    Allergies  Allergen Reactions   Lisinopril Cough   Triamterene  Other (See  Comments)    Cramps     Outpatient Medications Prior to Visit  Medication Sig   acyclovir  (ZOVIRAX ) 400 MG tablet Take 1 tablet (400 mg total) by mouth daily.   Calcium Citrate (CITRACAL PO) Take 500 mg by mouth daily.    clobetasol  (TEMOVATE ) 0.05 % external solution Apply topically 2 times daily.   cyclobenzaprine  (FLEXERIL ) 10 MG tablet Take 1 tablet (10 mg total) by mouth daily as needed.   diclofenac  Sodium (VOLTAREN ) 1 % GEL Apply 2 grams to the affected areas 3 times daily   Fluticasone  Propionate (XHANCE ) 93 MCG/ACT EXHU Place 1 spray into the nose in the morning and at bedtime.   hydrOXYzine  (VISTARIL ) 50 MG capsule Take 2 capsules (100 mg total) by mouth at bedtime for sleep   levocetirizine (XYZAL ) 5 MG tablet Take 1 tablet (5 mg total) by mouth every evening.   lisdexamfetamine (VYVANSE ) 60 MG capsule Take 1 capsule (60 mg total) by mouth every morning.   minoxidil  (LONITEN ) 2.5 MG tablet Take 1 tablet (2.5 mg total) by mouth daily.   Multiple Vitamin (MULTIVITAMIN) tablet Take 1 tablet by mouth daily.    olmesartan -hydrochlorothiazide  (BENICAR  HCT) 20-12.5 MG tablet Take 1 tablet by mouth daily.   pantoprazole  (PROTONIX ) 40 MG tablet Take 1 tablet by mouth daily before breakfast.   RESTASIS  0.05 % ophthalmic emulsion Place 1 drop  into both eyes 2 times daily.   spironolactone  (ALDACTONE ) 25 MG tablet Take 0.5 tablets (12.5 mg total) by mouth 3 days per week   sulfamethoxazole -trimethoprim  (BACTRIM  DS) 800-160 MG tablet Take 1 tablet by mouth 2 (two) times daily.   traZODone  (DESYREL ) 100 MG tablet Take 1 tablet (100 mg total) by mouth at bedtime.   vitamin B-12 (CYANOCOBALAMIN) 1000 MCG tablet Takes 5000 mcg  once every 3 months, on 6 mcg daily with flinstone   Vitamin D , Ergocalciferol , (DRISDOL ) 1.25 MG (50000 UNIT) CAPS capsule Take 1 capsule (50,000 Units total) by mouth every 2 weeks.   [START ON 04/12/2024] lisdexamfetamine (VYVANSE ) 60 MG capsule Take 1 capsule (60 mg  total) by mouth every morning. (Patient not taking: Reported on 04/02/2024)   predniSONE  (DELTASONE ) 20 MG tablet Take 1 tablet (20 mg total) by mouth 2 (two) times daily with a meal. (Patient not taking: Reported on 04/02/2024)   No facility-administered medications prior to visit.    Review of Systems  Genitourinary:  Positive for flank pain.       Objective:   BP 129/84   Pulse 76   Resp 18   SpO2 99%   Vitals:   04/02/24 1611  BP: 129/84  Pulse: 76  Resp: 18  SpO2: 99%    Physical Exam Vitals and nursing note reviewed.  Constitutional:      Appearance: Normal appearance.  HENT:     Head: Normocephalic.     Nose: Nose normal.     Mouth/Throat:     Mouth: Mucous membranes are moist.  Pulmonary:     Effort: Pulmonary effort is normal.     Breath sounds: Normal breath sounds.  Musculoskeletal:        General: Tenderness present.  Skin:    General: Skin is warm and dry.  Neurological:     Mental Status: She is oriented to person, place, and time.  Psychiatric:        Mood and Affect: Mood normal.        Behavior: Behavior normal.      No results found for any visits on 04/02/24.  Recent Results (from the past 2160 hours)  Urinalysis, Routine w reflex microscopic     Status: Abnormal   Collection Time: 03/29/24  9:16 AM  Result Value Ref Range   Color, Urine YELLOW YELLOW   APPearance CLOUDY (A) CLEAR   Specific Gravity, Urine 1.020 1.001 - 1.035   pH 7.0 5.0 - 8.0   Glucose, UA NEGATIVE NEGATIVE   Bilirubin Urine NEGATIVE NEGATIVE   Ketones, ur NEGATIVE NEGATIVE   Hgb urine dipstick NEGATIVE NEGATIVE   Protein, ur NEGATIVE NEGATIVE   Nitrite NEGATIVE NEGATIVE   Leukocytes,Ua 2+ (A) NEGATIVE   WBC, UA 6-10 (A) 0 - 5 /HPF   RBC / HPF NONE SEEN 0 - 2 /HPF   Squamous Epithelial / HPF 6-10 (A) < OR = 5 /HPF   Bacteria, UA FEW (A) NONE SEEN /HPF   Hyaline Cast NONE SEEN NONE SEEN /LPF  Microscopic Message     Status: None   Collection Time: 03/29/24   9:16 AM  Result Value Ref Range   Note      Comment: This urine was analyzed for the presence of WBC,  RBC, bacteria, casts, and other formed elements.  Only those elements seen were reported. . .       Assessment & Plan:  1) UA for C & S - UA today neg  for leuks, nitrites, blood 2) Cont bactrim  3) Left lower back pain - CT abd/pelvis with contrast ordered, hx of nephrolithiasis 4) Will contact patient with results of C & S and CT of abd/pelvis   Problem List Items Addressed This Visit   None   No follow-ups on file.   Total time spent: 20 minutes  Neale Carpen, NP  04/02/2024   This document may have been prepared by [facility] Voice Recognition software and as such may include unintentional dictation errors.

## 2024-04-04 LAB — URINE CULTURE

## 2024-04-05 LAB — URINALYSIS
Bilirubin, UA: NEGATIVE
Glucose, UA: NEGATIVE
Ketones, UA: NEGATIVE
Leukocytes,UA: NEGATIVE
Nitrite, UA: NEGATIVE
Protein,UA: NEGATIVE
RBC, UA: NEGATIVE
Specific Gravity, UA: 1.025 (ref 1.005–1.030)
Urobilinogen, Ur: 0.2 mg/dL (ref 0.2–1.0)
pH, UA: 5.5 (ref 5.0–7.5)

## 2024-04-09 ENCOUNTER — Ambulatory Visit (HOSPITAL_BASED_OUTPATIENT_CLINIC_OR_DEPARTMENT_OTHER)
Admission: RE | Admit: 2024-04-09 | Discharge: 2024-04-09 | Disposition: A | Discharge status: Home / Self Care | Source: Ambulatory Visit | Attending: Nurse Practitioner | Admitting: Nurse Practitioner

## 2024-04-09 DIAGNOSIS — N200 Calculus of kidney: Secondary | ICD-10-CM | POA: Diagnosis not present

## 2024-04-09 DIAGNOSIS — R1032 Left lower quadrant pain: Secondary | ICD-10-CM | POA: Insufficient documentation

## 2024-04-09 DIAGNOSIS — K5730 Diverticulosis of large intestine without perforation or abscess without bleeding: Secondary | ICD-10-CM | POA: Diagnosis not present

## 2024-04-09 DIAGNOSIS — K429 Umbilical hernia without obstruction or gangrene: Secondary | ICD-10-CM | POA: Diagnosis not present

## 2024-04-09 DIAGNOSIS — N281 Cyst of kidney, acquired: Secondary | ICD-10-CM | POA: Diagnosis not present

## 2024-04-09 MED ORDER — IOHEXOL 300 MG/ML  SOLN
100.0000 mL | Freq: Once | INTRAMUSCULAR | Status: AC | PRN
  Administered 2024-04-09: 100 mL via INTRAVENOUS

## 2024-04-11 ENCOUNTER — Encounter: Payer: Self-pay | Admitting: Nurse Practitioner

## 2024-04-12 ENCOUNTER — Other Ambulatory Visit (HOSPITAL_BASED_OUTPATIENT_CLINIC_OR_DEPARTMENT_OTHER): Payer: Self-pay

## 2024-04-15 ENCOUNTER — Other Ambulatory Visit (HOSPITAL_BASED_OUTPATIENT_CLINIC_OR_DEPARTMENT_OTHER): Payer: Self-pay

## 2024-04-15 MED ORDER — FLUZONE 0.5 ML IM SUSY
0.5000 mL | PREFILLED_SYRINGE | Freq: Once | INTRAMUSCULAR | 0 refills | Status: AC
Start: 2024-04-15 — End: 2024-04-16
  Filled 2024-04-15: qty 0.5, 1d supply, fill #0

## 2024-04-17 ENCOUNTER — Ambulatory Visit (HOSPITAL_BASED_OUTPATIENT_CLINIC_OR_DEPARTMENT_OTHER)

## 2024-04-17 ENCOUNTER — Ambulatory Visit (HOSPITAL_COMMUNITY)

## 2024-05-07 ENCOUNTER — Other Ambulatory Visit (HOSPITAL_BASED_OUTPATIENT_CLINIC_OR_DEPARTMENT_OTHER): Payer: Self-pay

## 2024-06-17 ENCOUNTER — Other Ambulatory Visit: Payer: Self-pay | Admitting: Family Medicine

## 2024-06-18 ENCOUNTER — Other Ambulatory Visit (HOSPITAL_BASED_OUTPATIENT_CLINIC_OR_DEPARTMENT_OTHER): Payer: Self-pay

## 2024-06-18 MED ORDER — MINOXIDIL 2.5 MG PO TABS
2.5000 mg | ORAL_TABLET | Freq: Every day | ORAL | 0 refills | Status: AC
  Filled 2024-06-18: qty 90, 90d supply, fill #0

## 2024-06-19 ENCOUNTER — Other Ambulatory Visit: Payer: Self-pay | Admitting: Internal Medicine

## 2024-06-19 ENCOUNTER — Other Ambulatory Visit (HOSPITAL_BASED_OUTPATIENT_CLINIC_OR_DEPARTMENT_OTHER): Payer: Self-pay

## 2024-06-19 ENCOUNTER — Telehealth: Payer: Self-pay

## 2024-06-19 DIAGNOSIS — F902 Attention-deficit hyperactivity disorder, combined type: Secondary | ICD-10-CM

## 2024-06-19 NOTE — Telephone Encounter (Signed)
 error

## 2024-06-21 ENCOUNTER — Other Ambulatory Visit (HOSPITAL_BASED_OUTPATIENT_CLINIC_OR_DEPARTMENT_OTHER): Payer: Self-pay

## 2024-06-22 ENCOUNTER — Other Ambulatory Visit (HOSPITAL_BASED_OUTPATIENT_CLINIC_OR_DEPARTMENT_OTHER): Payer: Self-pay

## 2024-06-24 ENCOUNTER — Other Ambulatory Visit (HOSPITAL_BASED_OUTPATIENT_CLINIC_OR_DEPARTMENT_OTHER): Payer: Self-pay

## 2024-06-25 ENCOUNTER — Other Ambulatory Visit (HOSPITAL_BASED_OUTPATIENT_CLINIC_OR_DEPARTMENT_OTHER): Payer: Self-pay

## 2024-06-26 ENCOUNTER — Other Ambulatory Visit (HOSPITAL_BASED_OUTPATIENT_CLINIC_OR_DEPARTMENT_OTHER): Payer: Self-pay

## 2024-07-15 ENCOUNTER — Other Ambulatory Visit: Payer: Self-pay | Admitting: Internal Medicine

## 2024-07-15 ENCOUNTER — Other Ambulatory Visit (HOSPITAL_BASED_OUTPATIENT_CLINIC_OR_DEPARTMENT_OTHER): Payer: Self-pay

## 2024-07-15 DIAGNOSIS — F902 Attention-deficit hyperactivity disorder, combined type: Secondary | ICD-10-CM

## 2024-07-15 MED ORDER — LISDEXAMFETAMINE DIMESYLATE 60 MG PO CAPS
60.0000 mg | ORAL_CAPSULE | ORAL | 0 refills | Status: DC
  Filled 2024-07-15: qty 90, 90d supply, fill #0

## 2024-08-20 ENCOUNTER — Other Ambulatory Visit (HOSPITAL_BASED_OUTPATIENT_CLINIC_OR_DEPARTMENT_OTHER): Payer: Self-pay

## 2024-08-20 ENCOUNTER — Telehealth (HOSPITAL_BASED_OUTPATIENT_CLINIC_OR_DEPARTMENT_OTHER): Payer: Self-pay

## 2024-08-20 ENCOUNTER — Other Ambulatory Visit (HOSPITAL_COMMUNITY): Payer: Self-pay

## 2024-08-20 ENCOUNTER — Encounter: Payer: Self-pay | Admitting: Family Medicine

## 2024-08-20 ENCOUNTER — Ambulatory Visit: Admitting: Family Medicine

## 2024-08-20 ENCOUNTER — Other Ambulatory Visit: Payer: Self-pay

## 2024-08-20 VITALS — BP 128/80 | HR 85 | Resp 18 | Ht 68.0 in | Wt 217.1 lb

## 2024-08-20 DIAGNOSIS — E785 Hyperlipidemia, unspecified: Secondary | ICD-10-CM | POA: Diagnosis not present

## 2024-08-20 DIAGNOSIS — M1990 Unspecified osteoarthritis, unspecified site: Secondary | ICD-10-CM

## 2024-08-20 DIAGNOSIS — M79642 Pain in left hand: Secondary | ICD-10-CM

## 2024-08-20 DIAGNOSIS — E8881 Metabolic syndrome: Secondary | ICD-10-CM | POA: Diagnosis not present

## 2024-08-20 DIAGNOSIS — I10 Essential (primary) hypertension: Secondary | ICD-10-CM | POA: Diagnosis not present

## 2024-08-20 DIAGNOSIS — E559 Vitamin D deficiency, unspecified: Secondary | ICD-10-CM

## 2024-08-20 DIAGNOSIS — Z01419 Encounter for gynecological examination (general) (routine) without abnormal findings: Secondary | ICD-10-CM

## 2024-08-20 DIAGNOSIS — K429 Umbilical hernia without obstruction or gangrene: Secondary | ICD-10-CM | POA: Diagnosis not present

## 2024-08-20 DIAGNOSIS — Z1329 Encounter for screening for other suspected endocrine disorder: Secondary | ICD-10-CM

## 2024-08-20 DIAGNOSIS — F902 Attention-deficit hyperactivity disorder, combined type: Secondary | ICD-10-CM | POA: Diagnosis not present

## 2024-08-20 DIAGNOSIS — Z131 Encounter for screening for diabetes mellitus: Secondary | ICD-10-CM

## 2024-08-20 DIAGNOSIS — K5730 Diverticulosis of large intestine without perforation or abscess without bleeding: Secondary | ICD-10-CM

## 2024-08-20 DIAGNOSIS — M79641 Pain in right hand: Secondary | ICD-10-CM

## 2024-08-20 DIAGNOSIS — Z8249 Family history of ischemic heart disease and other diseases of the circulatory system: Secondary | ICD-10-CM

## 2024-08-20 DIAGNOSIS — E66811 Obesity, class 1: Secondary | ICD-10-CM | POA: Diagnosis not present

## 2024-08-20 DIAGNOSIS — I700 Atherosclerosis of aorta: Secondary | ICD-10-CM | POA: Diagnosis not present

## 2024-08-20 MED ORDER — TIRZEPATIDE 2.5 MG/0.5ML ~~LOC~~ SOAJ
2.5000 mg | SUBCUTANEOUS | 0 refills | Status: DC
  Filled 2024-08-20: qty 2, 28d supply, fill #0

## 2024-08-20 NOTE — Telephone Encounter (Signed)
 Where is this request coming from? [facility] Medication: Mounjaro  2.5 mg/ 0.5 ml Prior authorization required? Yes If YES, on primary or secondary insurance? Primary Comments:

## 2024-08-20 NOTE — Telephone Encounter (Signed)
 PA request has been Received. New Encounter has been or will be created for follow up. For additional info see Pharmacy Prior Auth telephone encounter from 08/20/24.

## 2024-08-20 NOTE — Telephone Encounter (Signed)
 Ozempic/Mounjaro is approved exclusively as an adjunct to diet and exercise to improve glycemic  control in adults with type 2 diabetes mellitus. A review of patient's medical chart reveals no  documented diagnosis of type 2 diabetes or an A1C indicative of diabetes. Therefore, they do not  currently meet the criteria for prior authorization of this medication. If clinically appropriate, alternative  options such as Saxenda, Zepbound, or Georjean may be considered for this patient.   To help increase the likelihood of a successful prior authorization for this GLP1, please add a diagnosis of Type II diabetes to the patient's problem list, if clinically appropriate, and associate the diagnosis with a recent visit note. Thank you for your partnership.

## 2024-08-20 NOTE — Patient Instructions (Addendum)
"   F/U end June  You are preferred for pap, Family tree  Schedule and get mammogram pls asap  Labs already ordered pls prinet and add HBa1C  It is important that you exercise regularly at least 30 minutes 5 times a week. If you develop chest pain, have severe difficulty breathing, or feel very tired, stop exercising immediately and seek medical attention    You are referred to Rheumatology re pain ,stiffness weakness of hands  x      Mounjaro  is prescribed, message re coverage  I will message cardiology re imaging recommend "

## 2024-08-21 ENCOUNTER — Ambulatory Visit: Attending: Internal Medicine | Admitting: Internal Medicine

## 2024-08-21 ENCOUNTER — Encounter: Payer: Self-pay | Admitting: Internal Medicine

## 2024-08-21 ENCOUNTER — Other Ambulatory Visit (HOSPITAL_BASED_OUTPATIENT_CLINIC_OR_DEPARTMENT_OTHER): Payer: Self-pay

## 2024-08-21 ENCOUNTER — Encounter: Payer: Self-pay | Admitting: Family Medicine

## 2024-08-21 ENCOUNTER — Other Ambulatory Visit (HOSPITAL_COMMUNITY): Payer: Self-pay | Admitting: Family Medicine

## 2024-08-21 ENCOUNTER — Ambulatory Visit: Payer: Self-pay | Admitting: Family Medicine

## 2024-08-21 ENCOUNTER — Other Ambulatory Visit: Payer: Self-pay

## 2024-08-21 ENCOUNTER — Other Ambulatory Visit (HOSPITAL_COMMUNITY): Payer: Self-pay

## 2024-08-21 VITALS — BP 116/74 | HR 74 | Ht 68.0 in | Wt 217.0 lb

## 2024-08-21 DIAGNOSIS — Z1329 Encounter for screening for other suspected endocrine disorder: Secondary | ICD-10-CM | POA: Insufficient documentation

## 2024-08-21 DIAGNOSIS — K5730 Diverticulosis of large intestine without perforation or abscess without bleeding: Secondary | ICD-10-CM | POA: Insufficient documentation

## 2024-08-21 DIAGNOSIS — Z131 Encounter for screening for diabetes mellitus: Secondary | ICD-10-CM | POA: Insufficient documentation

## 2024-08-21 DIAGNOSIS — Z136 Encounter for screening for cardiovascular disorders: Secondary | ICD-10-CM | POA: Insufficient documentation

## 2024-08-21 DIAGNOSIS — R7989 Other specified abnormal findings of blood chemistry: Secondary | ICD-10-CM

## 2024-08-21 DIAGNOSIS — Z1231 Encounter for screening mammogram for malignant neoplasm of breast: Secondary | ICD-10-CM

## 2024-08-21 DIAGNOSIS — E049 Nontoxic goiter, unspecified: Secondary | ICD-10-CM

## 2024-08-21 DIAGNOSIS — Z8249 Family history of ischemic heart disease and other diseases of the circulatory system: Secondary | ICD-10-CM

## 2024-08-21 DIAGNOSIS — I700 Atherosclerosis of aorta: Secondary | ICD-10-CM | POA: Insufficient documentation

## 2024-08-21 DIAGNOSIS — K429 Umbilical hernia without obstruction or gangrene: Secondary | ICD-10-CM | POA: Insufficient documentation

## 2024-08-21 DIAGNOSIS — Z01419 Encounter for gynecological examination (general) (routine) without abnormal findings: Secondary | ICD-10-CM | POA: Insufficient documentation

## 2024-08-21 DIAGNOSIS — M79641 Pain in right hand: Secondary | ICD-10-CM | POA: Insufficient documentation

## 2024-08-21 DIAGNOSIS — E041 Nontoxic single thyroid nodule: Secondary | ICD-10-CM

## 2024-08-21 LAB — CBC WITH DIFFERENTIAL/PLATELET
Basophils Absolute: 0 10*3/uL (ref 0.0–0.2)
Basos: 1 %
EOS (ABSOLUTE): 0.2 10*3/uL (ref 0.0–0.4)
Eos: 3 %
Hematocrit: 41.6 % (ref 34.0–46.6)
Hemoglobin: 13.9 g/dL (ref 11.1–15.9)
Immature Grans (Abs): 0 10*3/uL (ref 0.0–0.1)
Immature Granulocytes: 0 %
Lymphocytes Absolute: 2.9 10*3/uL (ref 0.7–3.1)
Lymphs: 49 %
MCH: 29.9 pg (ref 26.6–33.0)
MCHC: 33.4 g/dL (ref 31.5–35.7)
MCV: 90 fL (ref 79–97)
Monocytes Absolute: 0.5 10*3/uL (ref 0.1–0.9)
Monocytes: 9 %
Neutrophils Absolute: 2.3 10*3/uL (ref 1.4–7.0)
Neutrophils: 38 %
Platelets: 385 10*3/uL (ref 150–450)
RBC: 4.65 x10E6/uL (ref 3.77–5.28)
RDW: 12.4 % (ref 11.7–15.4)
WBC: 6 10*3/uL (ref 3.4–10.8)

## 2024-08-21 LAB — CMP14+EGFR
ALT: 21 [IU]/L (ref 0–32)
AST: 24 [IU]/L (ref 0–40)
Albumin: 4.4 g/dL (ref 3.8–4.9)
Alkaline Phosphatase: 63 [IU]/L (ref 49–135)
BUN/Creatinine Ratio: 14 (ref 9–23)
BUN: 14 mg/dL (ref 6–24)
Bilirubin Total: 0.4 mg/dL (ref 0.0–1.2)
CO2: 25 mmol/L (ref 20–29)
Calcium: 9.5 mg/dL (ref 8.7–10.2)
Chloride: 101 mmol/L (ref 96–106)
Creatinine, Ser: 0.97 mg/dL (ref 0.57–1.00)
Globulin, Total: 2.9 g/dL (ref 1.5–4.5)
Glucose: 85 mg/dL (ref 70–99)
Potassium: 4.9 mmol/L (ref 3.5–5.2)
Sodium: 139 mmol/L (ref 134–144)
Total Protein: 7.3 g/dL (ref 6.0–8.5)
eGFR: 70 mL/min/{1.73_m2}

## 2024-08-21 LAB — LIPID PANEL
Chol/HDL Ratio: 2.6 ratio (ref 0.0–4.4)
Cholesterol, Total: 185 mg/dL (ref 100–199)
HDL: 72 mg/dL
LDL Chol Calc (NIH): 100 mg/dL — ABNORMAL HIGH (ref 0–99)
Triglycerides: 68 mg/dL (ref 0–149)
VLDL Cholesterol Cal: 13 mg/dL (ref 5–40)

## 2024-08-21 LAB — HEMOGLOBIN A1C
Est. average glucose Bld gHb Est-mCnc: 111 mg/dL
Hgb A1c MFr Bld: 5.5 % (ref 4.8–5.6)

## 2024-08-21 LAB — TSH: TSH: 0.271 u[IU]/mL — ABNORMAL LOW (ref 0.450–4.500)

## 2024-08-21 LAB — VITAMIN D 25 HYDROXY (VIT D DEFICIENCY, FRACTURES): Vit D, 25-Hydroxy: 36.4 ng/mL (ref 30.0–100.0)

## 2024-08-21 MED ORDER — LISDEXAMFETAMINE DIMESYLATE 60 MG PO CAPS: 60.0000 mg | ORAL_CAPSULE | ORAL | 0 refills | Status: AC

## 2024-08-21 MED ORDER — METOPROLOL TARTRATE 100 MG PO TABS
100.0000 mg | ORAL_TABLET | Freq: Once | ORAL | 0 refills | Status: AC
  Filled 2024-08-21: qty 81, 40d supply, fill #0
  Filled 2024-08-21: qty 99, 50d supply, fill #0
  Filled 2024-08-30: qty 1, 1d supply, fill #0

## 2024-08-21 MED ORDER — OLMESARTAN MEDOXOMIL-HCTZ 20-12.5 MG PO TABS
1.0000 | ORAL_TABLET | Freq: Every day | ORAL | 1 refills | Status: AC
  Filled 2024-08-21: qty 90, 90d supply, fill #0

## 2024-08-21 MED ORDER — ZEPBOUND 2.5 MG/0.5ML ~~LOC~~ SOAJ
2.5000 mg | SUBCUTANEOUS | 0 refills | Status: AC
  Filled 2024-08-21 – 2024-08-30 (×3): qty 2, 28d supply, fill #0

## 2024-08-21 NOTE — Assessment & Plan Note (Signed)
-  asymptomatic

## 2024-08-21 NOTE — Assessment & Plan Note (Signed)
 Positive f/h of premature CAD, refer Cardiology for eval

## 2024-08-21 NOTE — Progress Notes (Signed)
 "    Cardiology Office Note  Date: 08/21/2024   ID: Kristine Lawson, Kristine Lawson 08/07/1970, MRN [id number]  PCP:  Antonetta Rollene BRAVO, MD  Cardiologist:  Diannah SHAUNNA Maywood, MD Electrophysiologist:  None   History of Present Illness: Kristine Lawson is a 54 y.o. female known to have HTN, premature CAD was referred to cardiology clinic for further evaluation of CAD.  Patient has a history of premature CAD.  Her father had massive heart attack when he was 54 years old and passed away when he was 54 years old.  He was told that he was not a candidate for PCI or CABG.  He also had CKD history.  Patient's father's siblings also had heart attacks.  Patient underwent CT abdomen/pelvis without contrast in September 2025 that showed aortic atherosclerosis.  She does not have any symptoms of angina or DOE.  Doing great.  Currently on olmesartan -HCTZ 20-12.5 mg once daily.  She has been taking this medication for almost 2 decades.  On minoxidil  for hair.  On spironolactone  for acne.  Smoked for 3 years in the past but not anymore.  Past Medical History:  Diagnosis Date   Allergy    Anemia    IDA   Arthritis    Arthritis    Family history of breast cancer    Family history of cancer of gallbladder    Family history of cervical cancer    Family history of lung cancer    Family history of prostate cancer    Family history of throat cancer    GERD (gastroesophageal reflux disease)    History of stomach ulcers    Hypertension    Insomnia    meds needed daily to achieve 6+ hours sleep.   Trapezius strain 11/13/2019   Ureteral stone with hydronephrosis 05/09/2020    Past Surgical History:  Procedure Laterality Date   ABLATION     uterine ablation   APPENDECTOMY     BIOPSY  12/18/2019   Procedure: BIOPSY;  Surgeon: Golda Claudis PENNER, MD;  [facility];  Service: Endoscopy;;  gastric   BREATH TEK H PYLORI N/A 11/18/2014   Procedure: BREATH TEK VEAR LORA;  Surgeon: Camellia Blush, MD;   Location: [facility];  Service: General;  Laterality: N/A;   CHOLECYSTECTOMY     COLONOSCOPY N/A 12/18/2019   Procedure: COLONOSCOPY;  Surgeon: Golda Claudis PENNER, MD;  [facility];  Service: Endoscopy;  Laterality: N/A;  930   CYSTOSCOPY W/ RETROGRADES Right 05/07/2020   Procedure: CYSTOSCOPY WITH RIGHT RETROGRADE PYELOGRAM;  Surgeon: Sherrilee Belvie CROME, MD;  Location: [facility];  Service: Urology;  Laterality: Right;   CYSTOSCOPY W/ URETERAL STENT PLACEMENT Right 05/07/2020   Procedure: CYSTOSCOPY WITH RIGHT URETERAL STENT REPLACEMENT;  Surgeon: Sherrilee Belvie CROME, MD;  Location: [facility];  Service: Urology;  Laterality: Right;   ESOPHAGOGASTRODUODENOSCOPY N/A 10/01/2018   Procedure: ESOPHAGOGASTRODUODENOSCOPY (EGD);  Surgeon: Golda Claudis PENNER, MD;  [facility];  Service: Endoscopy;  Laterality: N/A;   ESOPHAGOGASTRODUODENOSCOPY N/A 12/18/2019   Procedure: ESOPHAGOGASTRODUODENOSCOPY (EGD);  Surgeon: Golda Claudis PENNER, MD;  [facility];  Service: Endoscopy;  Laterality: N/A;   EXTRACORPOREAL SHOCK WAVE LITHOTRIPSY Right 05/12/2020   Procedure: EXTRACORPOREAL SHOCK WAVE LITHOTRIPSY (ESWL);  Surgeon: Sherrilee Belvie CROME, MD;  Location: [facility];  Service: Urology;  Laterality: Right;   HERNIA REPAIR N/A    Phreesia 11/10/2020   LAPAROSCOPIC ROUX-EN-Y GASTRIC BYPASS WITH HIATAL HERNIA REPAIR N/A 02/10/2015  Procedure: LAPAROSCOPIC ROUX-EN-Y GASTRIC BYPASS ;  Surgeon: Camellia Blush, MD;  Location: [facility];  Service: General;  Laterality: N/A;   TUBAL LIGATION     UPPER GI ENDOSCOPY N/A 02/10/2015   Procedure: UPPER GI ENDOSCOPY;  Surgeon: Camellia Blush, MD;  Location: [facility];  Service: General;  Laterality: N/A;    Current Outpatient Medications  Medication Sig Dispense Refill   acyclovir  (ZOVIRAX ) 400 MG tablet Take 1 tablet (400 mg total) by mouth daily. 90 tablet 3   Calcium Citrate (CITRACAL PO) Take 500 mg by mouth daily.      clobetasol  (TEMOVATE ) 0.05 % external  solution Apply topically 2 times daily. 50 mL 1   cyclobenzaprine  (FLEXERIL ) 10 MG tablet Take 1 tablet (10 mg total) by mouth daily as needed. 90 tablet 1   diclofenac  Sodium (VOLTAREN ) 1 % GEL Apply 2 grams to the affected areas 3 times daily 100 g 0   Fluticasone  Propionate (XHANCE ) 93 MCG/ACT EXHU Place 1 spray into the nose in the morning and at bedtime. 48 mL 3   hydrOXYzine  (VISTARIL ) 50 MG capsule Take 2 capsules (100 mg total) by mouth at bedtime for sleep 180 capsule 1   levocetirizine (XYZAL ) 5 MG tablet Take 1 tablet (5 mg total) by mouth every evening. 90 tablet 1   [START ON 10/07/2024] lisdexamfetamine  (VYVANSE ) 60 MG capsule Take 1 capsule (60 mg total) by mouth every morning. 90 capsule 0   minoxidil  (LONITEN ) 2.5 MG tablet Take 1 tablet (2.5 mg total) by mouth daily. 90 tablet 0   Multiple Vitamin (MULTIVITAMIN) tablet Take 1 tablet by mouth daily.      olmesartan -hydrochlorothiazide  (BENICAR  HCT) 20-12.5 MG tablet Take 1 tablet by mouth daily. 90 tablet 1   pantoprazole  (PROTONIX ) 40 MG tablet Take 1 tablet by mouth daily before breakfast. 90 tablet 2   RESTASIS  0.05 % ophthalmic emulsion Place 1 drop into both eyes 2 times daily. 60 each 1   spironolactone  (ALDACTONE ) 25 MG tablet Take 0.5 tablets (12.5 mg total) by mouth 3 days per week 20 tablet 3   traZODone  (DESYREL ) 100 MG tablet Take 1 tablet (100 mg total) by mouth at bedtime. 90 tablet 3   vitamin B-12 (CYANOCOBALAMIN ) 1000 MCG tablet Takes 5000 mcg  once every 3 months, on 6 mcg daily with flinstone     Vitamin D , Ergocalciferol , (DRISDOL ) 1.25 MG (50000 UNIT) CAPS capsule Take 1 capsule (50,000 Units total) by mouth every 2 weeks. 6 capsule 3   ZEPBOUND  2.5 MG/0.5ML Pen Inject 2.5 mg into the skin once a week. 2 mL 0   No current facility-administered medications for this visit.   Allergies:  Lisinopril and Triamterene    Social History: The patient  reports that she quit smoking about 17 years ago. Her smoking use  included cigarettes. She started smoking about 20 years ago. She has a 0.8 pack-year smoking history. She has never used smokeless tobacco. She reports current alcohol use. She reports that she does not use drugs.   Family History: The patient's family history includes Blindness in her daughter; Breast cancer in her cousin, paternal aunt, paternal aunt, paternal aunt, and other family members; Breast cancer (age of onset: 16) in her mother; Cancer in her mother; Cancer (age of onset: 13) in her maternal aunt; Cervical cancer in her maternal grandmother; Colon cancer (age of onset: 5) in her mother; Diabetes in her brother and mother; Diverticulitis in her father; Healthy in her daughter, sister, and son; Heart attack  in her father and paternal grandmother; Heart disease in her father; Hyperlipidemia in her father and mother; Hypertension in her brother, mother, and paternal grandmother; Kidney disease in her daughter, father, and mother; Lung cancer in her paternal grandfather and paternal uncle; Other in her daughter; Prostate cancer in her maternal grandfather, maternal uncle, and maternal uncle; SIDS in her maternal aunt; Stroke in her father and paternal grandmother; Throat cancer in her paternal grandfather and paternal uncle.   ROS:  Please see the history of present illness. Otherwise, complete review of systems is positive for none  All other systems are reviewed and negative.   Physical Exam: VS:  BP 116/74   Pulse 74   Ht 5' 8 (1.727 m)   Wt 217 lb (98.4 kg)   BMI 32.99 kg/m , BMI Body mass index is 32.99 kg/m.  Wt Readings from Last 3 Encounters:  08/21/24 217 lb (98.4 kg)  08/20/24 217 lb 1.3 oz (98.5 kg)  03/29/24 220 lb 12.8 oz (100.2 kg)    General: Patient appears comfortable at rest. HEENT: Conjunctiva and lids normal, oropharynx clear with moist mucosa. Neck: Supple, no elevated JVP or carotid bruits, no thyromegaly. Lungs: Clear to auscultation, nonlabored breathing at  rest. Cardiac: Regular rate and rhythm, no S3 or significant systolic murmur, no pericardial rub. Abdomen: Soft, nontender, no hepatomegaly, bowel sounds present, no guarding or rebound. Extremities: No pitting edema, distal pulses 2+. Skin: Warm and dry. Musculoskeletal: No kyphosis. Neuropsychiatric: Alert and oriented x3, affect grossly appropriate.  Recent Labwork: 08/20/2024: ALT 21; AST 24; BUN 14; Creatinine, Ser 0.97; Hemoglobin 13.9; Platelets 385; Potassium 4.9; Sodium 139; TSH 0.271     Component Value Date/Time   CHOL 185 08/20/2024 1037   TRIG 68 08/20/2024 1037   HDL 72 08/20/2024 1037   CHOLHDL 2.6 08/20/2024 1037   CHOLHDL 3.2 09/09/2019 1030   VLDL 9 01/31/2018 1519   LDLCALC 100 (H) 08/20/2024 1037   LDLCALC 118 (H) 09/09/2019 1030    Assessment and Plan:  Family history of premature CAD Screening for CAD - Patient's father had massive MI at the age of 43 and was told that he was not a candidate for PCI or CABG.  He had risk factors of HTN, DM2 and obesity.  Patient does not have any known history of MI/PCI/CABG. - Obtain cardiac CTA. I reviewed and discussed symptoms of CAD and MI.  ER precautions. - Recommend lifestyle modifications, diet and exercise.  HLD, borderline control - LDL 100 in January 2026.  Goal LDL should be less than 100 and preferably less than 70.  Obtain LP(a) levels.  If LP(a) levels are significantly elevated, will need to start PCSK9 inhibitors or Leqvio.  If LP(a) levels are within normal limits, she will benefit from low-dose rosuvastatin or atorvastatin after 3 months of lifestyle modification.  Will need to be lipid panel at that time.  Can get labs with PCP.  HTN, controlled - Continue olmesartan -HCTZ 20-12.5 mg once daily.  40 minutes monitoring for medical records, reports, more than 3 labs, discussion and documentation.     Medication Adjustments/Labs and Tests Ordered: Current medicines are reviewed at length with the patient  today.  Concerns regarding medicines are outlined above.    Disposition:  Follow up pending results  Signed Keimani Laufer Priya Joslyne Marshburn, MD, 08/21/2024 1:16 PM    [facility] at [facility] [address] [place], [place], [place] [address]  "

## 2024-08-21 NOTE — Assessment & Plan Note (Signed)
 Increased pain and stiffness in hands reported as well as weakness , refer Rheumatology for eval

## 2024-08-21 NOTE — Progress Notes (Signed)
 "  [name]     MRN: [id number]      DOB: 12/06/1970  Chief Complaint  Patient presents with   Hypertension    Follow up     HPI [name] is here for follow up and re-evaluation of chronic medical conditions, medication management and review of any available recent lab and radiology data.  Preventive health is updated, specifically  Cancer screening and Immunization.referred for well woman and pap    The PT denies any adverse reactions to current medications since the last visit.  4 month h/o increased bilateral hand pain and stiffness with weakness Arthrosclerosis of aorta noted , MILD, on imaging, reports CTA recommended , will refer to Cardiology for evaluation as deemed appropriate Requests med to help with weight loss, committed to healthy lifestyle which involves regular exercise ROS Denies recent fever or chills. Denies sinus pressure, nasal congestion, ear pain or sore throat. Denies chest congestion, productive cough or wheezing. Denies chest pains, palpitations and leg swelling Denies abdominal pain, nausea, vomiting,diarrhea or constipation.   Denies dysuria, frequency, hesitancy or incontinence. . Denies headaches, seizures, numbness, or tingling. Denies depression, anxiety or insomnia. Denies skin break down or rash.   PE  BP 128/80   Pulse 85   Resp 18   Ht 5' 8 (1.727 m)   Wt 217 lb 1.3 oz (98.5 kg)   SpO2 99%   BMI 33.01 kg/m   Patient alert and oriented and in no cardiopulmonary distress.  HEENT: No facial asymmetry, EOMI,     Neck supple .  Chest: Clear to auscultation bilaterally.  CVS: S1, S2 no murmurs, no S3.Regular rate.  Abd : soft non tender Ext: No edema  MS: Adequate ROM spine, shoulders, hips and knees.Weak grip bilaterally, mild deformity of digits  Skin: Intact, no ulcerations or rash noted.  Psych: Good eye contact, normal affect. Memory intact not anxious or depressed appearing.  CNS: CN 2-12 intact, power,  normal  throughout.no focal deficits noted.   Assessment & Plan  Umbilical hernia asymptomatic  Sigmoid diverticulosis asymptomatic  Calcification of aorta Positive f/h of premature CAD, refer Cardiology for eval  Dyslipidemia Hyperlipidemia:Low fat diet discussed and encouraged.   Lipid Panel  Lab Results  Component Value Date   CHOL 165 05/18/2023   HDL 64 05/18/2023   LDLCALC 92 05/18/2023   TRIG 44 05/18/2023   CHOLHDL 2.6 05/18/2023     Updated lab needed  Arthritis Increased pain and stiffness in hands reported as well as weakness , refer Rheumatology for eval  Bilateral hand pain 4 month history, progressive with weakness of hands, rheumatology eval  HYPERTENSION, BENIGN ESSENTIAL DASH diet and commitment to daily physical activity for a minimum of 30 minutes discussed and encouraged, as a part of hypertension management. The importance of attaining a healthy weight is also discussed.     08/20/2024   10:03 AM 04/02/2024    4:11 PM 03/29/2024    9:05 AM 02/22/2024    8:05 AM 10/18/2023    9:02 AM 10/18/2023    8:26 AM 05/26/2023   12:24 PM  BP/Weight  Systolic BP 128 129 120 136 138 147   Diastolic BP 80 84 72 84 86 91   Wt. (Lbs) 217.08  220.8 222.08  213.8 211  BMI 33.01 kg/m2  33.57 kg/m2 33.77 kg/m2  32.51 kg/m2 32.08 kg/m2     Controlled, no change in medication   Obesity (BMI 30.0-34.9)  Patient re-educated about  the importance  of commitment to a  minimum of 150 minutes of exercise per week as able.  The importance of healthy food choices with portion control discussed, as well as eating regularly and within a 12 hour window most days. The need to choose clean , green food 50 to 75% of the time is discussed, as well as to make water  the primary drink and set a goal of 64 ounces water  daily.       08/20/2024   10:03 AM 03/29/2024    9:05 AM 02/22/2024    8:05 AM  Weight /BMI  Weight 217 lb 1.3 oz 220 lb 12.8 oz 222 lb 1.3 oz  Height 5' 8 (1.727 m)  5' 8 (1.727 m) 5' 8 (1.727 m)  BMI 33.01 kg/m2 33.57 kg/m2 33.77 kg/m2    Request zepbound  denied will seek alternative  "

## 2024-08-21 NOTE — Patient Instructions (Addendum)
 Medication Instructions:  Your physician recommends that you continue on your current medications as directed. Please refer to the Current Medication list given to you today.   Labwork: Lipoprotein-a  Testing/Procedures:   Your cardiac CT will be scheduled at one of the below locations:   [facility] [address] [place], [place] [address] [phone] (Severe contrast allergies only)  OR  [facility] [address] [place], [place] [address] [phone]  OR   [facility] [address]  [place], [place] [address]   If scheduled at [facility], please arrive at the [facility] Entrance (Entrance C2) of [facility] 30 minutes prior to test start time. You can use the FREE valet parking offered at entrance C (encouraged to control the heart rate for the test)  Proceed to the [facility] Radiology Department (first floor) to check-in and test prep.  All radiology patients and guests should use entrance C2 at [facility], accessed from [facility], even though the hospital's physical address listed is [address]  If scheduled at the Heart and Vascular Tower at [address], please enter the parking lot using the [address] entrance and use the FREE valet service at the patient drop-off area. Enter the building and check-in with registration on the main floor.  If scheduled at [facility], please arrive to the Heart and Vascular Center 15 mins early for check-in and test prep.  There is spacious parking and easy access to the radiology department from the [facility] Heart and Vascular entrance. Please enter here and check-in with the desk attendant.   If scheduled at [facility], please arrive 30 minutes early for check-in and test prep.  Please follow these instructions carefully (unless otherwise directed):  An IV will be  required for this test and Nitroglycerin will be given.  Hold all erectile dysfunction medications at least 3 days (72 hrs) prior to test. (Ie viagra, cialis, sildenafil, tadalafil, etc)   On the Night Before the Test: Be sure to Drink plenty of water . Do not consume any caffeinated/decaffeinated beverages or chocolate 12 hours prior to your test. Do not take any antihistamines 12 hours prior to your test.  If the patient has contrast allergy: No allergy   On the Day of the Test: Drink plenty of water  until 1 hour prior to the test. Do not eat any food 1 hour prior to test. You may take your regular medications prior to the test.  Take Metoprolol  Tartrate 100 mg (Lopressor ) two hours prior to test. If you take Furosemide/Hydrochlorothiazide /Spironolactone /Chlorthalidone, please HOLD on the morning of the test. Please hold Olmesartan -hydrochlorothiazide  the morning of the test Patients who wear a continuous glucose monitor MUST remove the device prior to scanning. FEMALES- please wear underwire-free bra if available, avoid dresses & tight clothing      After the Test: Drink plenty of water . After receiving IV contrast, you may experience a mild flushed feeling. This is normal. On occasion, you may experience a mild rash up to 24 hours after the test. This is not dangerous. If this occurs, you can take Benadryl  25 mg, Zyrtec, Claritin, or Allegra and increase your fluid intake. (Patients taking Tikosyn should avoid Benadryl , and may take Zyrtec, Claritin, or Allegra) If you experience trouble breathing, this can be serious. If it is severe call 911 IMMEDIATELY. If it is mild, please call our office.  We  will call to schedule your test 2-4 weeks out understanding that some insurance companies will need an authorization prior to the service being performed.   For more information and frequently asked questions, please visit our website : [email or web address]  For non-scheduling  related questions, please contact the cardiac imaging nurse navigator should you have any questions/concerns: Cardiac Imaging Nurse Navigators Direct Office Dial: [phone]   For scheduling needs, including cancellations and rescheduling, please call Brittany, [phone].  For billing questions, please call [phone].    Follow-Up: Your physician recommends that you schedule a follow-up appointment in: Pending Results  Any Other Special Instructions Will Be Listed Below (If Applicable). Thank you for choosing [facility]!     If you need a refill on your cardiac medications before your next appointment, please call your pharmacy.

## 2024-08-21 NOTE — Assessment & Plan Note (Signed)
 DASH diet and commitment to daily physical activity for a minimum of 30 minutes discussed and encouraged, as a part of hypertension management. The importance of attaining a healthy weight is also discussed.     08/20/2024   10:03 AM 04/02/2024    4:11 PM 03/29/2024    9:05 AM 02/22/2024    8:05 AM 10/18/2023    9:02 AM 10/18/2023    8:26 AM 05/26/2023   12:24 PM  BP/Weight  Systolic BP 128 129 120 136 138 147   Diastolic BP 80 84 72 84 86 91   Wt. (Lbs) 217.08  220.8 222.08  213.8 211  BMI 33.01 kg/m2  33.57 kg/m2 33.77 kg/m2  32.51 kg/m2 32.08 kg/m2     Controlled, no change in medication

## 2024-08-21 NOTE — Assessment & Plan Note (Signed)
 4 month history, progressive with weakness of hands, rheumatology eval

## 2024-08-21 NOTE — Assessment & Plan Note (Signed)
 Hyperlipidemia:Low fat diet discussed and encouraged.   Lipid Panel  Lab Results  Component Value Date   CHOL 165 05/18/2023   HDL 64 05/18/2023   LDLCALC 92 05/18/2023   TRIG 44 05/18/2023   CHOLHDL 2.6 05/18/2023     Updated lab needed

## 2024-08-21 NOTE — Addendum Note (Signed)
 Addended by: ANTONETTA ROLLENE BRAVO on: 08/21/2024 06:54 AM   Modules accepted: Orders

## 2024-08-21 NOTE — Assessment & Plan Note (Signed)
" °  Patient re-educated about  the importance of commitment to a  minimum of 150 minutes of exercise per week as able.  The importance of healthy food choices with portion control discussed, as well as eating regularly and within a 12 hour window most days. The need to choose clean , green food 50 to 75% of the time is discussed, as well as to make water  the primary drink and set a goal of 64 ounces water  daily.       08/20/2024   10:03 AM 03/29/2024    9:05 AM 02/22/2024    8:05 AM  Weight /BMI  Weight 217 lb 1.3 oz 220 lb 12.8 oz 222 lb 1.3 oz  Height 5' 8 (1.727 m) 5' 8 (1.727 m) 5' 8 (1.727 m)  BMI 33.01 kg/m2 33.57 kg/m2 33.77 kg/m2    Request zepbound  denied will seek alternative "

## 2024-08-27 ENCOUNTER — Encounter: Payer: Self-pay | Admitting: Family Medicine

## 2024-08-28 ENCOUNTER — Ambulatory Visit (HOSPITAL_COMMUNITY)
Admission: RE | Admit: 2024-08-28 | Discharge: 2024-08-28 | Disposition: A | Source: Ambulatory Visit | Attending: Family Medicine | Admitting: Family Medicine

## 2024-08-28 ENCOUNTER — Encounter: Payer: Self-pay | Admitting: Family Medicine

## 2024-08-28 ENCOUNTER — Other Ambulatory Visit (HOSPITAL_COMMUNITY): Payer: Self-pay

## 2024-08-28 ENCOUNTER — Telehealth (HOSPITAL_BASED_OUTPATIENT_CLINIC_OR_DEPARTMENT_OTHER): Payer: Self-pay

## 2024-08-28 ENCOUNTER — Other Ambulatory Visit: Payer: Self-pay | Admitting: Family Medicine

## 2024-08-28 ENCOUNTER — Ambulatory Visit: Payer: Self-pay | Admitting: Family Medicine

## 2024-08-28 ENCOUNTER — Other Ambulatory Visit: Payer: Self-pay

## 2024-08-28 ENCOUNTER — Other Ambulatory Visit (HOSPITAL_BASED_OUTPATIENT_CLINIC_OR_DEPARTMENT_OTHER): Payer: Self-pay

## 2024-08-28 DIAGNOSIS — R7989 Other specified abnormal findings of blood chemistry: Secondary | ICD-10-CM

## 2024-08-28 DIAGNOSIS — Z1231 Encounter for screening mammogram for malignant neoplasm of breast: Secondary | ICD-10-CM

## 2024-08-28 DIAGNOSIS — D649 Anemia, unspecified: Secondary | ICD-10-CM

## 2024-08-28 DIAGNOSIS — E049 Nontoxic goiter, unspecified: Secondary | ICD-10-CM

## 2024-08-28 DIAGNOSIS — E041 Nontoxic single thyroid nodule: Secondary | ICD-10-CM

## 2024-08-28 DIAGNOSIS — E042 Nontoxic multinodular goiter: Secondary | ICD-10-CM

## 2024-08-28 NOTE — Telephone Encounter (Signed)
 Where is this request coming from? [facility] Medication: Zepbound  2.5mg  Prior authorization required? Yes If YES, on primary or secondary insurance? Primary Comments:

## 2024-08-28 NOTE — Progress Notes (Signed)
 Referred for aspirtion of suspicious thyroid  nodules

## 2024-08-30 ENCOUNTER — Telehealth: Payer: Self-pay | Admitting: Pharmacy Technician

## 2024-08-30 ENCOUNTER — Other Ambulatory Visit (HOSPITAL_BASED_OUTPATIENT_CLINIC_OR_DEPARTMENT_OTHER): Payer: Self-pay

## 2024-08-30 ENCOUNTER — Other Ambulatory Visit (HOSPITAL_COMMUNITY): Payer: Self-pay

## 2024-08-30 ENCOUNTER — Telehealth: Payer: Self-pay | Admitting: Family Medicine

## 2024-08-30 NOTE — Telephone Encounter (Signed)
 Pharmacy Patient Advocate Encounter   Received notification from Pt Calls Messages that prior authorization for Zepbound  2.5mg /0.35ml auto-injectors is required/requested.   Insurance verification completed.   The patient is insured through [facility].   Per test claim: Per test claim, medication is not covered due to plan/benefit exclusion, PA not submitted at this time  [facility] plan does not cover any injectable weigh loss medications.   Phentermine  or Qsymia would be covered with a prior authorization.

## 2024-08-30 NOTE — Telephone Encounter (Signed)
 Patient is wanting to know the PA status of her Zepbound . No PA seen in chart. Could a PA be initiated for this patient? Thanks

## 2024-09-04 ENCOUNTER — Other Ambulatory Visit (HOSPITAL_COMMUNITY)

## 2024-09-06 ENCOUNTER — Ambulatory Visit (HOSPITAL_COMMUNITY)

## 2024-09-25 ENCOUNTER — Encounter: Admitting: Obstetrics & Gynecology

## 2024-10-17 ENCOUNTER — Ambulatory Visit: Admitting: Physician Assistant

## 2024-11-26 ENCOUNTER — Ambulatory Visit: Payer: Self-pay | Admitting: Nurse Practitioner

## 2024-12-27 ENCOUNTER — Ambulatory Visit: Admitting: Family Medicine
